# Patient Record
Sex: Male | Born: 1970 | Race: White | Hispanic: No | Marital: Single | Smoking: Never smoker
Health system: Southern US, Community
[De-identification: ages and names within clinical notes are randomized; demographics above are authoritative.]

## PROBLEM LIST (undated history)

## (undated) ENCOUNTER — Ambulatory Visit: Admission: EM | Payer: BC Managed Care – PPO

## (undated) DIAGNOSIS — E079 Disorder of thyroid, unspecified: Secondary | ICD-10-CM

## (undated) DIAGNOSIS — I10 Essential (primary) hypertension: Secondary | ICD-10-CM

## (undated) DIAGNOSIS — T7840XA Allergy, unspecified, initial encounter: Secondary | ICD-10-CM

## (undated) DIAGNOSIS — K219 Gastro-esophageal reflux disease without esophagitis: Secondary | ICD-10-CM

## (undated) DIAGNOSIS — I4891 Unspecified atrial fibrillation: Secondary | ICD-10-CM

## (undated) HISTORY — DX: Allergy, unspecified, initial encounter: T78.40XA

## (undated) HISTORY — PX: CHOLECYSTECTOMY: SHX55

## (undated) HISTORY — PX: GALLBLADDER SURGERY: SHX652

## (undated) HISTORY — DX: Gastro-esophageal reflux disease without esophagitis: K21.9

---

## 2020-09-17 DIAGNOSIS — Z86711 Personal history of pulmonary embolism: Secondary | ICD-10-CM | POA: Insufficient documentation

## 2020-09-17 DIAGNOSIS — D649 Anemia, unspecified: Secondary | ICD-10-CM | POA: Insufficient documentation

## 2020-09-17 DIAGNOSIS — I5030 Unspecified diastolic (congestive) heart failure: Secondary | ICD-10-CM | POA: Insufficient documentation

## 2020-09-17 DIAGNOSIS — D721 Eosinophilia, unspecified: Secondary | ICD-10-CM | POA: Insufficient documentation

## 2020-09-17 DIAGNOSIS — E039 Hypothyroidism, unspecified: Secondary | ICD-10-CM | POA: Insufficient documentation

## 2020-09-17 DIAGNOSIS — I4891 Unspecified atrial fibrillation: Secondary | ICD-10-CM | POA: Insufficient documentation

## 2020-09-17 DIAGNOSIS — Z7901 Long term (current) use of anticoagulants: Secondary | ICD-10-CM | POA: Insufficient documentation

## 2020-09-17 DIAGNOSIS — F319 Bipolar disorder, unspecified: Secondary | ICD-10-CM | POA: Insufficient documentation

## 2020-09-17 DIAGNOSIS — I429 Cardiomyopathy, unspecified: Secondary | ICD-10-CM | POA: Insufficient documentation

## 2020-09-17 DIAGNOSIS — E871 Hypo-osmolality and hyponatremia: Secondary | ICD-10-CM | POA: Insufficient documentation

## 2020-12-27 DIAGNOSIS — K760 Fatty (change of) liver, not elsewhere classified: Secondary | ICD-10-CM | POA: Insufficient documentation

## 2021-01-09 DIAGNOSIS — I959 Hypotension, unspecified: Secondary | ICD-10-CM | POA: Insufficient documentation

## 2021-01-09 DIAGNOSIS — I2693 Single subsegmental pulmonary embolism without acute cor pulmonale: Secondary | ICD-10-CM | POA: Insufficient documentation

## 2021-01-09 DIAGNOSIS — M7989 Other specified soft tissue disorders: Secondary | ICD-10-CM | POA: Insufficient documentation

## 2021-01-20 DIAGNOSIS — R001 Bradycardia, unspecified: Secondary | ICD-10-CM | POA: Insufficient documentation

## 2021-02-03 DIAGNOSIS — I10 Essential (primary) hypertension: Secondary | ICD-10-CM | POA: Insufficient documentation

## 2021-02-03 DIAGNOSIS — I5022 Chronic systolic (congestive) heart failure: Secondary | ICD-10-CM | POA: Insufficient documentation

## 2021-02-14 DIAGNOSIS — Z79899 Other long term (current) drug therapy: Secondary | ICD-10-CM | POA: Insufficient documentation

## 2021-04-25 DIAGNOSIS — I4811 Longstanding persistent atrial fibrillation: Secondary | ICD-10-CM | POA: Insufficient documentation

## 2021-05-04 DIAGNOSIS — R600 Localized edema: Secondary | ICD-10-CM | POA: Insufficient documentation

## 2021-05-04 DIAGNOSIS — E278 Other specified disorders of adrenal gland: Secondary | ICD-10-CM | POA: Insufficient documentation

## 2021-05-04 DIAGNOSIS — L603 Nail dystrophy: Secondary | ICD-10-CM | POA: Insufficient documentation

## 2021-07-26 DIAGNOSIS — Z23 Encounter for immunization: Secondary | ICD-10-CM | POA: Diagnosis not present

## 2021-07-26 DIAGNOSIS — Z1211 Encounter for screening for malignant neoplasm of colon: Secondary | ICD-10-CM | POA: Diagnosis not present

## 2021-07-26 DIAGNOSIS — F319 Bipolar disorder, unspecified: Secondary | ICD-10-CM | POA: Diagnosis not present

## 2021-07-26 DIAGNOSIS — Z7901 Long term (current) use of anticoagulants: Secondary | ICD-10-CM | POA: Diagnosis not present

## 2021-07-26 DIAGNOSIS — I4891 Unspecified atrial fibrillation: Secondary | ICD-10-CM | POA: Diagnosis not present

## 2021-07-26 DIAGNOSIS — I10 Essential (primary) hypertension: Secondary | ICD-10-CM | POA: Diagnosis not present

## 2021-07-26 DIAGNOSIS — Z79899 Other long term (current) drug therapy: Secondary | ICD-10-CM | POA: Diagnosis not present

## 2021-07-26 DIAGNOSIS — I4819 Other persistent atrial fibrillation: Secondary | ICD-10-CM | POA: Diagnosis not present

## 2021-07-26 DIAGNOSIS — I872 Venous insufficiency (chronic) (peripheral): Secondary | ICD-10-CM | POA: Diagnosis not present

## 2021-07-30 DIAGNOSIS — Z86711 Personal history of pulmonary embolism: Secondary | ICD-10-CM | POA: Diagnosis not present

## 2021-07-30 DIAGNOSIS — I4891 Unspecified atrial fibrillation: Secondary | ICD-10-CM | POA: Diagnosis not present

## 2021-07-30 DIAGNOSIS — Z7901 Long term (current) use of anticoagulants: Secondary | ICD-10-CM | POA: Diagnosis not present

## 2021-07-30 DIAGNOSIS — R45850 Homicidal ideations: Secondary | ICD-10-CM | POA: Diagnosis not present

## 2021-07-30 DIAGNOSIS — Z20822 Contact with and (suspected) exposure to covid-19: Secondary | ICD-10-CM | POA: Diagnosis not present

## 2021-07-30 DIAGNOSIS — F32A Depression, unspecified: Secondary | ICD-10-CM | POA: Diagnosis not present

## 2021-07-30 DIAGNOSIS — Z79899 Other long term (current) drug therapy: Secondary | ICD-10-CM | POA: Diagnosis not present

## 2021-07-30 DIAGNOSIS — F319 Bipolar disorder, unspecified: Secondary | ICD-10-CM | POA: Diagnosis not present

## 2021-07-30 DIAGNOSIS — Z9114 Patient's other noncompliance with medication regimen: Secondary | ICD-10-CM | POA: Diagnosis not present

## 2021-07-30 DIAGNOSIS — Z8249 Family history of ischemic heart disease and other diseases of the circulatory system: Secondary | ICD-10-CM | POA: Diagnosis not present

## 2021-07-31 DIAGNOSIS — F319 Bipolar disorder, unspecified: Secondary | ICD-10-CM | POA: Diagnosis not present

## 2021-08-01 DIAGNOSIS — Z7901 Long term (current) use of anticoagulants: Secondary | ICD-10-CM | POA: Diagnosis not present

## 2021-08-01 DIAGNOSIS — Z981 Arthrodesis status: Secondary | ICD-10-CM | POA: Diagnosis not present

## 2021-08-01 DIAGNOSIS — Z008 Encounter for other general examination: Secondary | ICD-10-CM | POA: Diagnosis not present

## 2021-08-01 DIAGNOSIS — I509 Heart failure, unspecified: Secondary | ICD-10-CM | POA: Diagnosis not present

## 2021-08-01 DIAGNOSIS — F5104 Psychophysiologic insomnia: Secondary | ICD-10-CM | POA: Diagnosis not present

## 2021-08-01 DIAGNOSIS — Z20822 Contact with and (suspected) exposure to covid-19: Secondary | ICD-10-CM | POA: Diagnosis not present

## 2021-08-01 DIAGNOSIS — E039 Hypothyroidism, unspecified: Secondary | ICD-10-CM | POA: Diagnosis not present

## 2021-08-01 DIAGNOSIS — L03116 Cellulitis of left lower limb: Secondary | ICD-10-CM | POA: Diagnosis not present

## 2021-08-01 DIAGNOSIS — I4819 Other persistent atrial fibrillation: Secondary | ICD-10-CM | POA: Diagnosis not present

## 2021-08-01 DIAGNOSIS — Z9114 Patient's other noncompliance with medication regimen: Secondary | ICD-10-CM | POA: Diagnosis not present

## 2021-08-01 DIAGNOSIS — Z86711 Personal history of pulmonary embolism: Secondary | ICD-10-CM | POA: Diagnosis not present

## 2021-08-01 DIAGNOSIS — Z8249 Family history of ischemic heart disease and other diseases of the circulatory system: Secondary | ICD-10-CM | POA: Diagnosis not present

## 2021-08-01 DIAGNOSIS — R45850 Homicidal ideations: Secondary | ICD-10-CM | POA: Diagnosis not present

## 2021-08-01 DIAGNOSIS — K5900 Constipation, unspecified: Secondary | ICD-10-CM | POA: Diagnosis not present

## 2021-08-01 DIAGNOSIS — R600 Localized edema: Secondary | ICD-10-CM | POA: Diagnosis not present

## 2021-08-01 DIAGNOSIS — I110 Hypertensive heart disease with heart failure: Secondary | ICD-10-CM | POA: Diagnosis not present

## 2021-08-01 DIAGNOSIS — Z79890 Hormone replacement therapy (postmenopausal): Secondary | ICD-10-CM | POA: Diagnosis not present

## 2021-08-01 DIAGNOSIS — F3130 Bipolar disorder, current episode depressed, mild or moderate severity, unspecified: Secondary | ICD-10-CM | POA: Diagnosis not present

## 2021-08-01 DIAGNOSIS — Z91199 Patient's noncompliance with other medical treatment and regimen due to unspecified reason: Secondary | ICD-10-CM | POA: Diagnosis not present

## 2021-08-01 DIAGNOSIS — Z79899 Other long term (current) drug therapy: Secondary | ICD-10-CM | POA: Diagnosis not present

## 2021-08-01 DIAGNOSIS — Z634 Disappearance and death of family member: Secondary | ICD-10-CM | POA: Diagnosis not present

## 2021-08-01 DIAGNOSIS — I4891 Unspecified atrial fibrillation: Secondary | ICD-10-CM | POA: Diagnosis not present

## 2021-08-01 DIAGNOSIS — Z818 Family history of other mental and behavioral disorders: Secondary | ICD-10-CM | POA: Diagnosis not present

## 2021-08-01 DIAGNOSIS — F319 Bipolar disorder, unspecified: Secondary | ICD-10-CM | POA: Diagnosis not present

## 2021-08-11 DIAGNOSIS — L03116 Cellulitis of left lower limb: Secondary | ICD-10-CM | POA: Insufficient documentation

## 2021-08-24 DIAGNOSIS — I951 Orthostatic hypotension: Secondary | ICD-10-CM | POA: Diagnosis not present

## 2021-08-24 DIAGNOSIS — I4811 Longstanding persistent atrial fibrillation: Secondary | ICD-10-CM | POA: Diagnosis not present

## 2021-09-20 ENCOUNTER — Ambulatory Visit: Payer: BC Managed Care – PPO | Admitting: Nurse Practitioner

## 2021-09-20 ENCOUNTER — Encounter: Payer: Self-pay | Admitting: Nurse Practitioner

## 2021-09-20 VITALS — BP 92/60 | HR 96 | Temp 97.4°F | Resp 18 | Ht 77.0 in | Wt 235.4 lb

## 2021-09-20 DIAGNOSIS — Z86711 Personal history of pulmonary embolism: Secondary | ICD-10-CM | POA: Diagnosis not present

## 2021-09-20 DIAGNOSIS — E039 Hypothyroidism, unspecified: Secondary | ICD-10-CM

## 2021-09-20 DIAGNOSIS — I952 Hypotension due to drugs: Secondary | ICD-10-CM

## 2021-09-20 DIAGNOSIS — L97521 Non-pressure chronic ulcer of other part of left foot limited to breakdown of skin: Secondary | ICD-10-CM

## 2021-09-20 DIAGNOSIS — I5030 Unspecified diastolic (congestive) heart failure: Secondary | ICD-10-CM

## 2021-09-20 DIAGNOSIS — I10 Essential (primary) hypertension: Secondary | ICD-10-CM | POA: Diagnosis not present

## 2021-09-20 DIAGNOSIS — Z7689 Persons encountering health services in other specified circumstances: Secondary | ICD-10-CM

## 2021-09-20 DIAGNOSIS — I4891 Unspecified atrial fibrillation: Secondary | ICD-10-CM | POA: Diagnosis not present

## 2021-09-20 MED ORDER — AMOXICILLIN-POT CLAVULANATE 875-125 MG PO TABS: 1.0000 | ORAL_TABLET | Freq: Two times a day (BID) | ORAL | 0 refills | Status: DC

## 2021-09-20 MED ORDER — EUTHYROX 75 MCG PO TABS: 75.0000 ug | ORAL_TABLET | Freq: Every day | ORAL | 1 refills | Status: DC

## 2021-09-20 NOTE — Progress Notes (Addendum)
BP 92/60    Pulse 96    Temp (!) 97.4 F (36.3 C) (Oral)    Resp 18    Ht 6\' 5"  (1.956 m)    Wt 235 lb 6.4 oz (106.8 kg)    SpO2 98%    BMI 27.91 kg/m    Subjective:    Patient ID: Curtis Hodges, male    DOB: [birth year], 51 y.o.   MRN: [id number]  HPI: Curtis Hodges is a 51 y.o. male, here with sister  Chief Complaint  Patient presents with   Establish Care    Pt is currently wanting to know if could get some antibiotic for his toe.   Establish Care:  Unsure when last physical he thinks it has been a year or two.  He will schedule for physical.   Afib/heart failure/ Multiple PEs:  He has had Afib for awhile and has been taking metoprolol 25 mg daily and digoxin. He said this past spring he had a multiple PEs and was placed on xarelto.  He says that the also did an echo and said he has heart failure. Currently on xarelto 20 mg daily.  He sees 04-08-1991) Dr. [facility], next appointment in November 23, 2021.  He has no complaints now. He denies any chest pain, shortness of breath or pedal edema.    HTN/hypotension: He is taking metoprolol to control his Afib.  He says this causes his low blood pressure.  He also had a lot of anxiety in the summer which caused his blood pressure to be high so he was placed on clonidine.  He has since stopped the clonidine.  Hypothyroidism: He says that his hypothyroid has been well controlled with his medication euthyrox 75 mcg daily.  He denies any weight loss or gain, changes in skin or hair loss. He also denies any palpitations.  Last TSH was 4.145 on 05/04/21.  Anxiety/ Bipolar:  Recently hospitalized for his anxiety, he has been out of work. 05/06/21 is his psychiatrist in [place]. He wants to continue to see him for his psychiatric care. He says he is doing much better.  He denies any suicidal thoughts.   Ulcer on left foot:  Was seen in April 2022 by orthopedics in May 2022 for similar problem.  He says a biopsy was done and it  was negative for osteomyelitis. The wound healed and he was doing okay.  His sister says that he wore bad shoes before Christmas and it started another ulcer. Ulcer is located just below the left great toe on the pad of the foot. Will start on antibiotics and send referral to podiatry.  His left great toe is larger than his right great toe. No sign of necrosis. He does have palpable pulses and good cap refill. His sister says that after the biopsy in April of 2022 he was supposed to see a vein specialist, but that appointment was cancelled due to his psychiatric admission.  New referral placed for vascular.   Relevant past medical, surgical, family and social history reviewed and updated as indicated. Interim medical history since our last visit reviewed. Allergies and medications reviewed and updated.  Review of Systems  Constitutional: Negative for fever or weight change.  Respiratory: Negative for cough and shortness of breath.   Cardiovascular: Negative for chest pain or palpitations.  Gastrointestinal: Negative for abdominal pain, no bowel changes.  Musculoskeletal: Negative for gait problem or joint swelling.  Skin: Negative for rash. Positive for wound  on bottom of left foot Neurological: Negative for dizziness or headache.  No other specific complaints in a complete review of systems (except as listed in HPI above).      Objective:    BP 92/60    Pulse 96    Temp (!) 97.4 F (36.3 C) (Oral)    Resp 18    Ht 6\' 5"  (1.956 m)    Wt 235 lb 6.4 oz (106.8 kg)    SpO2 98%    BMI 27.91 kg/m   Wt Readings from Last 3 Encounters:  09/20/21 235 lb 6.4 oz (106.8 kg)    Physical Exam  Constitutional: Patient appears well-developed and well-nourished. Overweight No distress.  HEENT: head atraumatic, normocephalic, pupils equal and reactive to light, neck supple Cardiovascular: Normal rate, irregular rhythm and normal heart sounds.  No murmur heard. No BLE edema. Pulmonary/Chest: Effort normal  and breath sounds normal. No respiratory distress. Abdominal: Soft.  There is no tenderness. Left foot: good PMS, ulcer noted on pad below left great toe, no sign of necrosis Psychiatric: Patient has a normal mood and affect. behavior is normal. Judgment and thought content normal.     Assessment & Plan:   1. Atrial fibrillation, unspecified type (HCC) -continue current treatment  2. Personal history of pulmonary embolism -continue current treatment  3. Diastolic heart failure, unspecified HF chronicity (HCC) -continue current treatment  4. Primary hypertension -continue to monitor  5. Hypotension due to drugs -continue to monitor  6. Hypothyroidism, unspecified type  - EUTHYROX 75 MCG tablet; Take 1 tablet (75 mcg total) by mouth daily.  Dispense: 90 tablet; Refill: 1  7. Ulcer of left foot, limited to breakdown of skin (HCC)  - amoxicillin-clavulanate (AUGMENTIN) 875-125 MG tablet; Take 1 tablet by mouth 2 (two) times daily.  Dispense: 20 tablet; Refill: 0 - Ambulatory referral to Podiatry - Ambulatory referral to Vascular Surgery  8. Encounter to establish care -make appointment for cpe    Follow up plan: Return in about 2 weeks (around 10/04/2021) for follow up.

## 2021-09-20 NOTE — Addendum Note (Signed)
Addended by: Della Goo F on: 09/20/2021 12:00 PM   Modules accepted: Orders

## 2021-09-28 ENCOUNTER — Ambulatory Visit (INDEPENDENT_AMBULATORY_CARE_PROVIDER_SITE_OTHER): Payer: BC Managed Care – PPO

## 2021-09-28 ENCOUNTER — Ambulatory Visit (INDEPENDENT_AMBULATORY_CARE_PROVIDER_SITE_OTHER): Payer: BC Managed Care – PPO | Admitting: Podiatry

## 2021-09-28 ENCOUNTER — Other Ambulatory Visit: Payer: Self-pay

## 2021-09-28 VITALS — BP 96/69 | HR 79 | Temp 97.2°F | Resp 16

## 2021-09-28 DIAGNOSIS — M19079 Primary osteoarthritis, unspecified ankle and foot: Secondary | ICD-10-CM | POA: Diagnosis not present

## 2021-09-28 DIAGNOSIS — R609 Edema, unspecified: Secondary | ICD-10-CM | POA: Diagnosis not present

## 2021-09-28 NOTE — Progress Notes (Signed)
Subjective:  Patient ID: Curtis Hodges, male    DOB: 10-24-1970,  MRN: [id number]  Chief Complaint  Patient presents with   Toe Pain    "I'm having swelling on the big toe of my feet."    51 y.o. male presents with the above complaint.  Patient presents with complaint of bilateral swelling to the dorsal midfoot.  Patient states that it swells up especially with being on his foot.  He states it started happening out of nowhere.  He wanted get it evaluated does not hurt him or cause any pain.  He is able to ambulate without any pain.  He denies seeing anyone else prior to seeing me.  He does not wear compression socks or does not elevate.  Would like to discuss treatment options.   Review of Systems: Negative except as noted in the HPI. Denies N/V/F/Ch.  No past medical history on file.  Current Outpatient Medications:    amoxicillin-clavulanate (AUGMENTIN) 875-125 MG tablet, Take 1 tablet by mouth 2 (two) times daily., Disp: 20 tablet, Rfl: 0   busPIRone (BUSPAR) 15 MG tablet, Take 30 mg by mouth 2 (two) times daily., Disp: , Rfl:    digoxin (LANOXIN) 0.25 MG tablet, Take 250 mcg by mouth daily., Disp: , Rfl:    EUTHYROX 75 MCG tablet, Take 1 tablet (75 mcg total) by mouth daily., Disp: 90 tablet, Rfl: 1   Melatonin 12 MG TABS, Take by mouth., Disp: , Rfl:    metoprolol succinate (TOPROL-XL) 25 MG 24 hr tablet, Take 25 mg by mouth at bedtime., Disp: , Rfl:    midodrine (PROAMATINE) 5 MG tablet, Take 5 mg by mouth 3 (three) times daily., Disp: , Rfl:    mirtazapine (REMERON) 30 MG tablet, Take 30 mg by mouth at bedtime., Disp: , Rfl:    oxcarbazepine (TRILEPTAL) 600 MG tablet, Take 600 mg by mouth 2 (two) times daily., Disp: , Rfl:    risperiDONE (RISPERDAL) 3 MG tablet, Take 6 mg by mouth at bedtime. 1.5 mg, Disp: , Rfl:    XARELTO 20 MG TABS tablet, Take 20 mg by mouth every morning., Disp: , Rfl:   Social History   Tobacco Use  Smoking Status Never  Smokeless Tobacco Never     Allergies  Allergen Reactions   Cat Hair Extract    Objective:   Vitals:   09/28/21 1129  BP: 96/69  Pulse: 79  Resp: 16  Temp: (!) 97.2 F (36.2 C)   There is no height or weight on file to calculate BMI. Constitutional Well developed. Well nourished.  Vascular Dorsalis pedis pulses palpable bilaterally. Posterior tibial pulses palpable bilaterally. Capillary refill normal to all digits.  No cyanosis or clubbing noted. Pedal hair growth normal.  Neurologic Normal speech. Oriented to person, place, and time. Epicritic sensation to light touch grossly present bilaterally.  Dermatologic Nails well groomed and normal in appearance. No open wounds. No skin lesions.  Orthopedic: Bilateral generalized swelling noted to the dorsum of the foot.  Underlying osteoarthritic changes noted.  No redness noted.  No clinical signs of infection noted no fluctuance noted.   Radiographs: 3 views of skeletally mature adult bilateral foot: Osteoarthritic changes noted to bilateral's first metatarsophalangeal joint as well as midfoot.  Patient has pes planovalgus foot structure. Assessment:   1. Arthritis of midfoot    Plan:  Patient was evaluated and treated and all questions answered.  Bilateral dorsal midfoot swelling likely due to underlying osteoarthritis -All questions and concerns  were discussed with the patient in extensive detail. -I discussed compression socks and elevation in extensive detail. -I discussed over-the-counter Voltaren gel to help with the arthritis. -At this time clinically does not have any pain therefore I will hold off on a steroid injection.  Patient agrees to the plan. -Shoe gear modification was discussed  No follow-ups on file.

## 2021-11-23 DIAGNOSIS — Z13220 Encounter for screening for lipoid disorders: Secondary | ICD-10-CM | POA: Diagnosis not present

## 2021-11-23 DIAGNOSIS — I951 Orthostatic hypotension: Secondary | ICD-10-CM | POA: Diagnosis not present

## 2021-11-23 DIAGNOSIS — I4811 Longstanding persistent atrial fibrillation: Secondary | ICD-10-CM | POA: Diagnosis not present

## 2021-12-02 DIAGNOSIS — I4811 Longstanding persistent atrial fibrillation: Secondary | ICD-10-CM | POA: Diagnosis not present

## 2021-12-02 DIAGNOSIS — I951 Orthostatic hypotension: Secondary | ICD-10-CM | POA: Diagnosis not present

## 2021-12-02 DIAGNOSIS — Z13220 Encounter for screening for lipoid disorders: Secondary | ICD-10-CM | POA: Diagnosis not present

## 2022-01-24 ENCOUNTER — Telehealth: Payer: Self-pay | Admitting: Internal Medicine

## 2022-01-24 NOTE — Telephone Encounter (Signed)
[facility] no longer has EUTHYROX 75 MCG table and they need a new RX for Levothyroxine / to change the manufacturer / please advise and send refill to [place], [place] [facility] [place]  ?[address] [place] [address]  ?Phone:  [phone]  Fax:  [phone]  ?

## 2022-01-25 MED ORDER — LEVOTHYROXINE SODIUM 75 MCG PO TABS: 75.0000 ug | ORAL_TABLET | Freq: Every day | ORAL | 3 refills | Status: DC

## 2022-02-20 ENCOUNTER — Ambulatory Visit (INDEPENDENT_AMBULATORY_CARE_PROVIDER_SITE_OTHER): Payer: BC Managed Care – PPO

## 2022-02-20 ENCOUNTER — Encounter: Payer: Self-pay | Admitting: Emergency Medicine

## 2022-02-20 ENCOUNTER — Ambulatory Visit
Admission: EM | Admit: 2022-02-20 | Discharge: 2022-02-20 | Disposition: A | Discharge status: Home / Self Care | Payer: BC Managed Care – PPO

## 2022-02-20 DIAGNOSIS — L97521 Non-pressure chronic ulcer of other part of left foot limited to breakdown of skin: Secondary | ICD-10-CM

## 2022-02-20 DIAGNOSIS — J0100 Acute maxillary sinusitis, unspecified: Secondary | ICD-10-CM

## 2022-02-20 DIAGNOSIS — J209 Acute bronchitis, unspecified: Secondary | ICD-10-CM | POA: Diagnosis not present

## 2022-02-20 DIAGNOSIS — R0600 Dyspnea, unspecified: Secondary | ICD-10-CM | POA: Diagnosis not present

## 2022-02-20 DIAGNOSIS — R0602 Shortness of breath: Secondary | ICD-10-CM | POA: Diagnosis not present

## 2022-02-20 DIAGNOSIS — R062 Wheezing: Secondary | ICD-10-CM | POA: Diagnosis not present

## 2022-02-20 MED ORDER — AMOXICILLIN-POT CLAVULANATE 875-125 MG PO TABS: 1.0000 | ORAL_TABLET | Freq: Two times a day (BID) | ORAL | 0 refills | Status: AC

## 2022-02-20 MED ORDER — ALBUTEROL SULFATE (2.5 MG/3ML) 0.083% IN NEBU
2.5000 mg | INHALATION_SOLUTION | Freq: Once | RESPIRATORY_TRACT | Status: AC
  Administered 2022-02-20: 2.5 mg via RESPIRATORY_TRACT

## 2022-02-20 MED ORDER — ALBUTEROL SULFATE HFA 108 (90 BASE) MCG/ACT IN AERS
2.0000 | INHALATION_SPRAY | Freq: Once | RESPIRATORY_TRACT | Status: AC
  Administered 2022-02-20: 2 via RESPIRATORY_TRACT

## 2022-02-20 MED ORDER — BENZONATATE 100 MG PO CAPS: 200.0000 mg | ORAL_CAPSULE | Freq: Three times a day (TID) | ORAL | 0 refills | Status: DC | PRN

## 2022-02-20 NOTE — Discharge Instructions (Addendum)
Chest x-ray is negative  I am treating you for both bronchitis and sinus infection. Take all prescribed medication. If symptoms worsen or do not readily improve, return for evaluation or follow-up with primary care provider. Use your inhaler 2 puffs every 4-6 hours as needed for shortness of breath or wheezing

## 2022-02-20 NOTE — ED Triage Notes (Signed)
Pt presents with cough and runny nose x 3 days. Pt states he had SOB yesterday and tried to use his inhaler but did not know how to use it. He denies any SOB today.

## 2022-02-20 NOTE — ED Provider Notes (Signed)
[facility]    CSN: [id number] Arrival date & time: 02/20/22  1405      History   Chief Complaint Chief Complaint  Patient presents with   Cough   Nasal Congestion    HPI Curtis Hodges is a 51 y.o. male.   HPI Patient with a history of CHF, bipolar, hypertension presents today for evaluation of wheezing and shortness of breath with associated URI symptoms x3 days.  Patient reports having 1 day of shortness of breath which was yesterday with exertion.  He reports today he has noticed some mild wheezing.  He had an inhaler at home however was uncertain of how to use it.  He has not taken any medication related to his current symptoms.  He has been afebrile.  He denies any productive cough although has a mild cough.  He denies any swelling or unintentional weight gain.  He denies any chest pain.  History reviewed. No pertinent past medical history.  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   Medication management 02/14/2021   Chronic systolic heart failure (HCC) 02/03/2021   Primary hypertension 02/03/2021   Bradycardia, unspecified 01/20/2021   Hypotension 01/09/2021   Single subsegmental pulmonary embolism without acute cor pulmonale (HCC) 01/09/2021   Swelling of toe of right foot 01/09/2021   NAFL (nonalcoholic fatty liver) 12/27/2020   Bipolar disorder, unspecified (HCC) 09/17/2020   Hypothyroidism, unspecified 09/17/2020   Unspecified atrial fibrillation (HCC) 09/17/2020   Personal history of pulmonary embolism 09/17/2020   Eosinophilia, unspecified 09/17/2020   Anemia, unspecified 09/17/2020   Cardiomyopathy, unspecified (HCC) 09/17/2020   Diastolic heart failure (HCC) 09/17/2020   Hypo-osmolality and hyponatremia 09/17/2020   Long term (current) use of anticoagulants 09/17/2020    Past Surgical  History:  Procedure Laterality Date   GALLBLADDER SURGERY         Home Medications    Prior to Admission medications   Medication Sig Start Date End Date Taking? Authorizing Provider  benzonatate (TESSALON) 100 MG capsule Take 2 capsules (200 mg total) by mouth 3 (three) times daily as needed for cough. 02/20/22  Yes Bing NeighborsHarris, Mikeal Winstanley S, FNP  busPIRone (BUSPAR) 15 MG tablet Take 30 mg by mouth 2 (two) times daily. 08/19/21  Yes [provider]  digoxin (LANOXIN) 0.25 MG tablet Take 250 mcg by mouth daily. 09/05/21  Yes [provider]  levothyroxine (SYNTHROID) 75 MCG tablet Take 1 tablet (75 mcg total) by mouth daily. 01/25/22  Yes Margarita MailAndrews, Elisabeth, DO  metoprolol succinate (TOPROL-XL) 50 MG 24 hr tablet Take 50 mg by mouth at bedtime. 01/26/22  Yes [provider]  midodrine (PROAMATINE) 5 MG tablet Take 5 mg by mouth 3 (three) times daily. 08/26/21  Yes [provider]  mirtazapine (REMERON) 30 MG tablet Take 30 mg by mouth at bedtime. 08/17/21  Yes [provider]  oxcarbazepine (TRILEPTAL) 600 MG tablet Take 600 mg by mouth 2 (two) times daily. 08/17/21  Yes [provider]  risperiDONE (RISPERDAL) 3 MG tablet Take 6 mg by mouth at bedtime. 1.5 mg 04/14/21  Yes [provider]  XARELTO 20 MG TABS tablet Take 20 mg by mouth every morning. 09/05/21  Yes [provider]  amoxicillin-clavulanate (AUGMENTIN) 875-125 MG tablet Take 1 tablet by mouth 2 (two) times daily for 10 days. 02/20/22 03/02/22  Tiburcio PeaHarris,  Godfrey Pick, FNP    Family History Family History  Problem Relation Age of Onset   COPD Mother    Atrial fibrillation Mother    Brain cancer Father     Social History Social History   Tobacco Use   Smoking status: Never   Smokeless tobacco: Never  Vaping Use   Vaping Use: Never used  Substance Use Topics   Alcohol use: Not Currently   Drug use: Never     Allergies   Cat hair extract   Review of  Systems Review of Systems Pertinent negatives listed in HPI   Physical Exam Triage Vital Signs ED Triage Vitals  Enc Vitals Group     BP 02/20/22 1419 124/87     Pulse Rate 02/20/22 1419 84     Resp --      Temp 02/20/22 1419 98.3 F (36.8 C)     Temp Source 02/20/22 1419 Oral     SpO2 02/20/22 1419 95 %     Weight --      Height --      Head Circumference --      Peak Flow --      Pain Score 02/20/22 1416 0     Pain Loc --      Pain Edu? --      Excl. in GC? --    No data found.  Updated Vital Signs BP 124/87 (BP Location: Left Arm)   Pulse 84   Temp 98.3 F (36.8 C) (Oral)   SpO2 95%   Visual Acuity Right Eye Distance:   Left Eye Distance:   Bilateral Distance:    Right Eye Near:   Left Eye Near:    Bilateral Near:     Physical Exam Constitutional:      Appearance: Normal appearance. He is not ill-appearing.  HENT:     Head: Normocephalic and atraumatic.     Right Ear: Tympanic membrane, ear canal and external ear normal.     Left Ear: Tympanic membrane, ear canal and external ear normal.     Nose: Congestion and rhinorrhea present.     Mouth/Throat:     Pharynx: No oropharyngeal exudate or posterior oropharyngeal erythema.  Eyes:     Extraocular Movements: Extraocular movements intact.     Pupils: Pupils are equal, round, and reactive to light.  Cardiovascular:     Rate and Rhythm: Normal rate. Rhythm irregular.  Pulmonary:     Breath sounds: Wheezing present.  Musculoskeletal:     Cervical back: Normal range of motion.  Lymphadenopathy:     Cervical: No cervical adenopathy.  Skin:    General: Skin is warm and dry.  Neurological:     General: No focal deficit present.     Mental Status: He is alert and oriented to person, place, and time.  Psychiatric:        Mood and Affect: Mood normal.        Behavior: Behavior normal.        Thought Content: Thought content normal.        Judgment: Judgment normal.     UC Treatments / Results   Labs (all labs ordered are listed, but only abnormal results are displayed) Labs Reviewed - No data to display  EKG   Radiology DG Chest 2 View  Result Date: 02/20/2022 CLINICAL DATA:  SOB with wheezing URI symptoms. Patient has history CHF. EXAM: CHEST - 2 VIEW COMPARISON:  None Available. FINDINGS: No consolidation. No visible  pleural effusions or pneumothorax per cardiomediastinal silhouette is within normal limits. No displaced fracture. IMPRESSION: No evidence of acute cardiopulmonary disease. Electronically Signed   By: Feliberto Harts M.D.   On: 02/20/2022 14:52    Procedures Procedures (including critical care time)  Medications Ordered in UC Medications  albuterol (PROVENTIL) (2.5 MG/3ML) 0.083% nebulizer solution 2.5 mg (2.5 mg Nebulization Given 02/20/22 1449)  albuterol (VENTOLIN HFA) 108 (90 Base) MCG/ACT inhaler 2 puff (2 puffs Inhalation Given 02/20/22 1503)    Initial Impression / Assessment and Plan / UC Course  I have reviewed the triage vital signs and the nursing notes.  Pertinent labs & imaging results that were available during my care of the patient were reviewed by me and considered in my medical decision making (see chart for details).    Checks x-ray was negative for any acute changes. Treating for acute sinusitis and acute bronchitis. Patient given a nebulizer treatment as he was actively wheezing while here in clinic today.  Patient tolerated nebulizer treatment with improved work of breathing.  Patient demonstrated how to use an inhaler by RN.  Patient given strict instructions to return if any of her symptoms worsen or do not readily improve.   Final Clinical Impressions(s) / UC Diagnoses   Final diagnoses:  Dyspnea, unspecified type  Acute bronchitis, unspecified organism  Acute non-recurrent maxillary sinusitis     Discharge Instructions      Chest x-ray is negative  I am treating you for both bronchitis and sinus infection. Take all  prescribed medication. If symptoms worsen or do not readily improve, return for evaluation or follow-up with primary care provider. Use your inhaler 2 puffs every 4-6 hours as needed for shortness of breath or wheezing      ED Prescriptions     Medication Sig Dispense Auth. Provider   amoxicillin-clavulanate (AUGMENTIN) 875-125 MG tablet Take 1 tablet by mouth 2 (two) times daily for 10 days. 20 tablet Bing Neighbors, FNP   benzonatate (TESSALON) 100 MG capsule Take 2 capsules (200 mg total) by mouth 3 (three) times daily as needed for cough. 30 capsule Bing Neighbors, FNP      PDMP not reviewed this encounter.   Bing Neighbors, FNP 02/20/22 [phone]

## 2022-07-23 ENCOUNTER — Other Ambulatory Visit: Payer: Self-pay

## 2022-07-23 ENCOUNTER — Encounter: Payer: Self-pay | Admitting: Nurse Practitioner

## 2022-07-23 ENCOUNTER — Ambulatory Visit (INDEPENDENT_AMBULATORY_CARE_PROVIDER_SITE_OTHER): Payer: BC Managed Care – PPO | Admitting: Nurse Practitioner

## 2022-07-23 VITALS — BP 122/70 | HR 71 | Temp 98.1°F | Resp 18 | Ht 77.0 in | Wt 240.9 lb

## 2022-07-23 DIAGNOSIS — Z0000 Encounter for general adult medical examination without abnormal findings: Secondary | ICD-10-CM

## 2022-07-23 DIAGNOSIS — Z79899 Other long term (current) drug therapy: Secondary | ICD-10-CM

## 2022-07-23 DIAGNOSIS — R600 Localized edema: Secondary | ICD-10-CM

## 2022-07-23 DIAGNOSIS — Z125 Encounter for screening for malignant neoplasm of prostate: Secondary | ICD-10-CM

## 2022-07-23 DIAGNOSIS — E039 Hypothyroidism, unspecified: Secondary | ICD-10-CM

## 2022-07-23 DIAGNOSIS — D649 Anemia, unspecified: Secondary | ICD-10-CM

## 2022-07-23 DIAGNOSIS — Z13220 Encounter for screening for lipoid disorders: Secondary | ICD-10-CM | POA: Diagnosis not present

## 2022-07-23 DIAGNOSIS — Z114 Encounter for screening for human immunodeficiency virus [HIV]: Secondary | ICD-10-CM

## 2022-07-23 DIAGNOSIS — F3161 Bipolar disorder, current episode mixed, mild: Secondary | ICD-10-CM

## 2022-07-23 DIAGNOSIS — R2689 Other abnormalities of gait and mobility: Secondary | ICD-10-CM

## 2022-07-23 DIAGNOSIS — Z131 Encounter for screening for diabetes mellitus: Secondary | ICD-10-CM

## 2022-07-23 DIAGNOSIS — I10 Essential (primary) hypertension: Secondary | ICD-10-CM | POA: Diagnosis not present

## 2022-07-23 DIAGNOSIS — Z1159 Encounter for screening for other viral diseases: Secondary | ICD-10-CM

## 2022-07-23 DIAGNOSIS — Z1212 Encounter for screening for malignant neoplasm of rectum: Secondary | ICD-10-CM

## 2022-07-23 DIAGNOSIS — Z1211 Encounter for screening for malignant neoplasm of colon: Secondary | ICD-10-CM

## 2022-07-23 NOTE — Progress Notes (Signed)
Name: LENON KUENNEN   MRN: [id number]    DOB: [birth year]   Date:07/23/2022       Progress Note  Subjective  Chief Complaint  Chief Complaint  Patient presents with   Annual Exam    HPI  Patient presents for annual CPE  and chronic issue. Patient aware of possible charge.   Right foot swelling:  He says that he has been having right foot swelling for about a year. He says that it comes and goes.  He says that sometimes it is painful. Patient would like to see a vascular surgeon.  He says he was told it was arthritis.  He would like further testing.  He denies any injury. Saw podiatry and they told him it was likely arthritis. He says he does not know if the swelling goes down with elevation.  Discussed compression socks he is resistent to that idea.  Will place referral to vascular.    Balance problem: he says he has had trouble with stairs for a long time. Patient reports that he losses his balance going up and down stairs.  He denies any pain with going up or down stairs. Recommend physical therapy.  Patient in agreement with plan.  Referral placed.   IPSS Questionnaire (AUA-7): Over the past month.   1)  How often have you had a sensation of not emptying your bladder completely after you finish urinating?  0 - Not at all  2)  How often have you had to urinate again less than two hours after you finished urinating? 0 - Not at all  3)  How often have you found you stopped and started again several times when you urinated?  0 - Not at all  4) How difficult have you found it to postpone urination?  0 - Not at all  5) How often have you had a weak urinary stream?  0 - Not at all  6) How often have you had to push or strain to begin urination?  0 - Not at all  7) How many times did you most typically get up to urinate from the time you went to bed until the time you got up in the morning?  0 - None  Total score:  0-7 mildly symptomatic   8-19 moderately symptomatic   20-35 severely  symptomatic     Diet: he says he needs to eat more fruits and vegetables but otherwise well rounded diet Exercise: he does not do anything for exercise out side of work , recommend getting at least 150 min of physical activity a week Sleep: 8 hours Last dental exam:he says it has been a long time Last eye exam: he says it was done last week, getting new glasses  Depression: phq 9 is negative    07/23/2022    9:46 AM 09/20/2021   10:43 AM  Depression screen PHQ 2/9  Decreased Interest 0 0  Down, Depressed, Hopeless 0 0  PHQ - 2 Score 0 0  Altered sleeping 0 0  Tired, decreased energy 0 0  Change in appetite 0 0  Feeling bad or failure about yourself  0 0  Trouble concentrating 0 0  Moving slowly or fidgety/restless 0 0  Suicidal thoughts 0 0  PHQ-9 Score 0 0  Difficult doing work/chores Not difficult at all Not difficult at all    Hypertension:  BP Readings from Last 3 Encounters:  07/23/22 122/70  02/20/22 124/87  09/28/21 96/69    Obesity:  Wt Readings from Last 3 Encounters:  07/23/22 240 lb 14.4 oz (109.3 kg)  09/20/21 235 lb 6.4 oz (106.8 kg)   BMI Readings from Last 3 Encounters:  07/23/22 28.57 kg/m  09/20/21 27.91 kg/m     Lipids:  No results found for: "CHOL" No results found for: "HDL" No results found for: "LDLCALC" No results found for: "TRIG" No results found for: "CHOLHDL" No results found for: "LDLDIRECT" Glucose:  No results found for: "GLUCOSE", "GLUCAP"  Flowsheet Row Office Visit from 07/23/2022 in [facility]  AUDIT-C Score 0        Single STD testing and prevention (HIV/chl/gon/syphilis): ordered Hep C: ordered  Skin cancer: Discussed monitoring for atypical lesions Colorectal cancer: ordered cologuard Prostate cancer: ordered No results found for: "PSA"   Lung cancer:   Low Dose CT Chest recommended if Age 79-80 years, 30 pack-year currently smoking OR have quit w/in 15years. Patient does not qualify.    AAA:  The USPSTF recommends one-time screening with ultrasonography in men ages 7 to 24 years who have ever smoked ECG:  none  Vaccines:  HPV: up to at age 48 , ask insurance if age between 47-45  Shingrix: 72-64 yo and ask insurance if covered when patient above 24 yo Pneumonia:  educated and discussed with patient. Flu:  educated and discussed with patient.  Advanced Care Planning: A voluntary discussion about advance care planning including the explanation and discussion of advance directives.  Discussed health care proxy and Living will, and the patient was able to identify a health care proxy as sister.  Patient does not have a living will at present time. If patient does have living will, I have requested they bring this to the clinic to be scanned in to their chart.  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   Longstanding persistent atrial fibrillation (HCC) 04/25/2021   Medication management 02/14/2021   Chronic systolic heart failure (HCC) 02/03/2021   Primary hypertension 02/03/2021   Bradycardia, unspecified 01/20/2021   Hypotension 01/09/2021   Single subsegmental pulmonary embolism without acute cor pulmonale (HCC) 01/09/2021   Swelling of toe of right foot 01/09/2021   NAFL (nonalcoholic fatty liver) 12/27/2020   Bipolar disorder, unspecified (HCC) 09/17/2020   Hypothyroidism, unspecified 09/17/2020   Unspecified atrial fibrillation (HCC) 09/17/2020   Personal history of pulmonary embolism 09/17/2020   Eosinophilia, unspecified 09/17/2020   Anemia, unspecified 09/17/2020   Cardiomyopathy, unspecified (HCC) 09/17/2020   Diastolic heart failure (HCC) 09/17/2020   Hypo-osmolality and hyponatremia 09/17/2020   Long term (current) use of anticoagulants 09/17/2020    Past Surgical History:  Procedure Laterality  Date   GALLBLADDER SURGERY      Family History  Problem Relation Age of Onset   COPD Mother    Atrial fibrillation Mother    Brain cancer Father     Social History   Socioeconomic History   Marital status: Single    Spouse name: Not on file   Number of children: Not on file   Years of education: Not on file   Highest education level: Not on file  Occupational History   Not on file  Tobacco Use   Smoking status: Never   Smokeless tobacco: Never  Vaping Use   Vaping Use: Never used  Substance and Sexual Activity   Alcohol use: Not  Currently   Drug use: Never   Sexual activity: Not Currently  Other Topics Concern   Not on file  Social History Narrative   Not on file   Social Determinants of Health   Financial Resource Strain: Low Risk  (07/23/2022)   Overall Financial Resource Strain (CARDIA)    Difficulty of Paying Living Expenses: Not hard at all  Food Insecurity: No Food Insecurity (07/23/2022)   Hunger Vital Sign    Worried About Running Out of Food in the Last Year: Never true    Ran Out of Food in the Last Year: Never true  Transportation Needs: No Transportation Needs (07/23/2022)   PRAPARE - Administrator, Civil Service (Medical): No    Lack of Transportation (Non-Medical): No  Physical Activity: Insufficiently Active (07/23/2022)   Exercise Vital Sign    Days of Exercise per Week: 5 days    Minutes of Exercise per Session: 10 min  Stress: No Stress Concern Present (07/23/2022)   [employer] of Occupational Health - Occupational Stress Questionnaire    Feeling of Stress : Only a little  Social Connections: Socially Isolated (07/23/2022)   Social Connection and Isolation Panel [NHANES]    Frequency of Communication with Friends and Family: Once a week    Frequency of Social Gatherings with Friends and Family: Once a week    Attends Religious Services: Never    Database administrator or Organizations: No    Attends Hospital doctor: 1 to 4 times per year    Marital Status: Never married  Intimate Partner Violence: Not At Risk (07/23/2022)   Humiliation, Afraid, Rape, and Kick questionnaire    Fear of Current or Ex-Partner: No    Emotionally Abused: No    Physically Abused: No    Sexually Abused: No     Current Outpatient Medications:    benzonatate (TESSALON) 100 MG capsule, Take 2 capsules (200 mg total) by mouth 3 (three) times daily as needed for cough., Disp: 30 capsule, Rfl: 0   busPIRone (BUSPAR) 15 MG tablet, Take 30 mg by mouth 2 (two) times daily., Disp: , Rfl:    digoxin (LANOXIN) 0.25 MG tablet, Take 250 mcg by mouth daily., Disp: , Rfl:    levothyroxine (SYNTHROID) 75 MCG tablet, Take 1 tablet (75 mcg total) by mouth daily., Disp: 90 tablet, Rfl: 3   metoprolol succinate (TOPROL-XL) 50 MG 24 hr tablet, Take 50 mg by mouth at bedtime., Disp: , Rfl:    midodrine (PROAMATINE) 5 MG tablet, Take 5 mg by mouth 3 (three) times daily., Disp: , Rfl:    mirtazapine (REMERON) 30 MG tablet, Take 30 mg by mouth at bedtime., Disp: , Rfl:    oxcarbazepine (TRILEPTAL) 600 MG tablet, Take 600 mg by mouth 2 (two) times daily., Disp: , Rfl:    risperiDONE (RISPERDAL) 3 MG tablet, Take 6 mg by mouth at bedtime. 1.5 mg, Disp: , Rfl:    XARELTO 20 MG TABS tablet, Take 20 mg by mouth every morning., Disp: , Rfl:   Allergies  Allergen Reactions   Cat Hair Extract      ROS  Constitutional: Negative for fever or weight change.  Respiratory: Negative for cough and shortness of breath.   Cardiovascular: Negative for chest pain or palpitations.  Gastrointestinal: Negative for abdominal pain, no bowel changes.  Musculoskeletal: Negative for gait problem or joint swelling.  Skin: Negative for rash.  Neurological: Negative for dizziness or headache.  No other  specific complaints in a complete review of systems (except as listed in HPI above).    Objective  Vitals:   07/23/22 0944  BP: 122/70  Pulse: 71  Resp:  18  Temp: 98.1 F (36.7 C)  TempSrc: Oral  SpO2: 96%  Weight: 240 lb 14.4 oz (109.3 kg)  Height: 6\' 5"  (1.956 m)    Body mass index is 28.57 kg/m.  Physical Exam  Constitutional: Patient appears well-developed and well-nourished. No distress.  HENT: Head: Normocephalic and atraumatic. Ears: B TMs ok, no erythema or effusion; Nose: Nose normal. Mouth/Throat: Oropharynx is clear and moist. No oropharyngeal exudate.  Eyes: Conjunctivae and EOM are normal. Pupils are equal, round, and reactive to light. No scleral icterus.  Neck: Normal range of motion. Neck supple. No JVD present. No thyromegaly present.  Cardiovascular: Normal rate, regular rhythm and normal heart sounds.  No murmur heard. No BLE edema. Pulmonary/Chest: Effort normal and breath sounds normal. No respiratory distress. Abdominal: Soft. Bowel sounds are normal, no distension. There is no tenderness. no masses Musculoskeletal: Normal range of motion, no joint effusions. No gross deformities Neurological: he is alert and oriented to person, place, and time. No cranial nerve deficit. Coordination, balance, strength, speech and gait are normal.  Skin: Skin is warm and dry. No rash noted. No erythema.  Psychiatric: Patient has a normal mood and affect. behavior is normal. Judgment and thought content normal.   No results found for this or any previous visit (from the past 2160 hour(s)).   Fall Risk:    07/23/2022    9:45 AM 09/20/2021   10:42 AM  Fall Risk   Falls in the past year? 1 0  Number falls in past yr: 1 0  Injury with Fall? 0 0  Risk for fall due to : History of fall(s) No Fall Risks  Follow up Falls evaluation completed Falls prevention discussed      Functional Status Survey: Is the patient deaf or have difficulty hearing?: No Does the patient have difficulty seeing, even when wearing glasses/contacts?: No Does the patient have difficulty concentrating, remembering, or making decisions?: No Does the  patient have difficulty walking or climbing stairs?: Yes Does the patient have difficulty dressing or bathing?: No Does the patient have difficulty doing errands alone such as visiting a doctor's office or shopping?: No    Assessment & Plan  1. Annual physical exam Increase physical activity as tolerated Eat more fruits and vegetables - Lipid panel - CBC with Differential/Platelet - COMPLETE METABOLIC PANEL WITH GFR - Hemoglobin A1c - Hepatitis C antibody - HIV Antibody (routine testing w rflx) - TSH - PSA  2. Prostate cancer screening  - PSA  3. Encounter for colorectal cancer screening  - Cologuard  4. Primary hypertension  - CBC with Differential/Platelet - COMPLETE METABOLIC PANEL WITH GFR  5. Hypothyroidism, unspecified type  - TSH  6. Bipolar disorder, current episode mixed, mild ([place])   7. Medication management  - Lipid panel - CBC with Differential/Platelet - COMPLETE METABOLIC PANEL WITH GFR - Hemoglobin A1c - TSH  8. Anemia, unspecified type  - CBC with Differential/Platelet  9. Screening for HIV without presence of risk factors  - HIV Antibody (routine testing w rflx)  10. Encounter for hepatitis C screening test for low risk patient  - Hepatitis C antibody  11. Screening for cholesterol level  - Lipid panel  12. Screening for diabetes mellitus  - Hemoglobin A1c  13. Edema of right foot Recommend trying  compression socks and elevating feet when you can - Ambulatory referral to Vascular Surgery   14. Balance problem  - Ambulatory referral to Physical Therapy   -Prostate cancer screening and PSA options (with potential risks and benefits of testing vs not testing) were discussed along with recent recs/guidelines. -USPSTF grade A and B recommendations reviewed with patient; age-appropriate recommendations, preventive care, screening tests, etc discussed and encouraged; healthy living encouraged; see AVS for patient education given  to patient -Discussed importance of 150 minutes of physical activity weekly, eat two servings of fish weekly, eat one serving of tree nuts ( cashews, pistachios, pecans, almonds.Marland Kitchen.) every other day, eat 6 servings of fruit/vegetables daily and drink plenty of water and avoid sweet beverages.

## 2022-07-24 LAB — COMPLETE METABOLIC PANEL WITH GFR
AG Ratio: 1.4 (calc) (ref 1.0–2.5)
ALT: 27 U/L (ref 9–46)
AST: 19 U/L (ref 10–35)
Albumin: 4.1 g/dL (ref 3.6–5.1)
Alkaline phosphatase (APISO): 123 U/L (ref 35–144)
BUN: 20 mg/dL (ref 7–25)
CO2: 27 mmol/L (ref 20–32)
Calcium: 9.5 mg/dL (ref 8.6–10.3)
Chloride: 106 mmol/L (ref 98–110)
Creat: 0.82 mg/dL (ref 0.70–1.30)
Globulin: 3 g/dL (calc) (ref 1.9–3.7)
Glucose, Bld: 98 mg/dL (ref 65–99)
Potassium: 4.2 mmol/L (ref 3.5–5.3)
Sodium: 141 mmol/L (ref 135–146)
Total Bilirubin: 0.5 mg/dL (ref 0.2–1.2)
Total Protein: 7.1 g/dL (ref 6.1–8.1)
eGFR: 106 mL/min/{1.73_m2} (ref >=60)

## 2022-07-24 LAB — CBC WITH DIFFERENTIAL/PLATELET
Absolute Monocytes: 621 cells/uL (ref 200–950)
Basophils Absolute: 51 cells/uL (ref 0–200)
Basophils Relative: 0.8 %
Eosinophils Absolute: 403 cells/uL (ref 15–500)
Eosinophils Relative: 6.3 %
HCT: 41.2 % (ref 38.5–50.0)
Hemoglobin: 14 g/dL (ref 13.2–17.1)
Lymphs Abs: 2438 cells/uL (ref 850–3900)
MCH: 32.1 pg (ref 27.0–33.0)
MCHC: 34 g/dL (ref 32.0–36.0)
MCV: 94.5 fL (ref 80.0–100.0)
MPV: 9.5 fL (ref 7.5–12.5)
Monocytes Relative: 9.7 %
Neutro Abs: 2886 cells/uL (ref 1500–7800)
Neutrophils Relative %: 45.1 %
Platelets: 297 10*3/uL (ref 140–400)
RBC: 4.36 10*6/uL (ref 4.20–5.80)
RDW: 12.3 % (ref 11.0–15.0)
Total Lymphocyte: 38.1 %
WBC: 6.4 10*3/uL (ref 3.8–10.8)

## 2022-07-24 LAB — PSA: PSA: 0.46 ng/mL (ref <=4.00)

## 2022-07-24 LAB — LIPID PANEL
Cholesterol: 155 mg/dL (ref <200)
HDL: 53 mg/dL (ref >=40)
LDL Cholesterol (Calc): 87 mg/dL (calc)
Non-HDL Cholesterol (Calc): 102 mg/dL (calc) (ref <130)
Total CHOL/HDL Ratio: 2.9 (calc) (ref <5.0)
Triglycerides: 67 mg/dL (ref <150)

## 2022-07-24 LAB — HEMOGLOBIN A1C
Hgb A1c MFr Bld: 5.7 % of total Hgb — ABNORMAL HIGH (ref <5.7)
Mean Plasma Glucose: 117 mg/dL
eAG (mmol/L): 6.5 mmol/L

## 2022-07-24 LAB — HIV ANTIBODY (ROUTINE TESTING W REFLEX): HIV 1&2 Ab, 4th Generation: NONREACTIVE

## 2022-07-24 LAB — HEPATITIS C ANTIBODY: Hepatitis C Ab: NONREACTIVE

## 2022-07-24 LAB — TSH: TSH: 3.86 mIU/L (ref 0.40–4.50)

## 2022-07-26 ENCOUNTER — Encounter: Payer: Self-pay | Admitting: Nurse Practitioner

## 2022-07-30 ENCOUNTER — Ambulatory Visit: Payer: BC Managed Care – PPO | Attending: Nurse Practitioner | Admitting: Physical Therapy

## 2022-07-30 ENCOUNTER — Other Ambulatory Visit: Payer: Self-pay

## 2022-07-30 ENCOUNTER — Encounter: Payer: Self-pay | Admitting: Physical Therapy

## 2022-07-30 DIAGNOSIS — M25571 Pain in right ankle and joints of right foot: Secondary | ICD-10-CM | POA: Insufficient documentation

## 2022-07-30 DIAGNOSIS — R2689 Other abnormalities of gait and mobility: Secondary | ICD-10-CM | POA: Insufficient documentation

## 2022-07-30 DIAGNOSIS — R262 Difficulty in walking, not elsewhere classified: Secondary | ICD-10-CM | POA: Diagnosis not present

## 2022-07-30 NOTE — Therapy (Signed)
OUTPATIENT PHYSICAL THERAPY LOWER EXTREMITY EVALUATION   Patient Name: Curtis Hodges MRN: [id number] DOB:1970/11/20, 51 y.o., male Today's Date: 07/30/2022   PT End of Session - 07/30/22 1344     Visit Number 1    Number of Visits 20    Date for PT Re-Evaluation 10/08/22    Authorization Type [employer]    Authorization Time Period 07/30/22-10/08/2022    Authorization - Visit Number 1    Authorization - Number of Visits 20    Progress Note Due on Visit 10    PT Start Time 1100    PT Stop Time 1145    PT Time Calculation (min) 45 min    Equipment Utilized During Treatment Gait belt    Activity Tolerance Patient tolerated treatment well             History reviewed. No pertinent past medical history. Past Surgical History:  Procedure Laterality Date   GALLBLADDER SURGERY     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   Longstanding persistent atrial fibrillation (HCC) 04/25/2021   Medication management 02/14/2021   Chronic systolic heart failure (HCC) 02/03/2021   Primary hypertension 02/03/2021   Bradycardia, unspecified 01/20/2021   Hypotension 01/09/2021   Single subsegmental pulmonary embolism without acute cor pulmonale (HCC) 01/09/2021   Swelling of toe of right foot 01/09/2021   NAFL (nonalcoholic fatty liver) 12/27/2020   Bipolar disorder, unspecified (HCC) 09/17/2020   Hypothyroidism, unspecified 09/17/2020   Unspecified atrial fibrillation (HCC) 09/17/2020   Personal history of pulmonary embolism 09/17/2020   Eosinophilia, unspecified 09/17/2020   Anemia, unspecified 09/17/2020   Cardiomyopathy, unspecified (HCC) 09/17/2020   Diastolic heart failure (HCC) 09/17/2020   Hypo-osmolality and hyponatremia 09/17/2020   Long term (current) use of anticoagulants 09/17/2020    PCP: Della Goo FNP    REFERRING PROVIDER: Della Goo FNP   REFERRING DIAG: Balance problem with stairs   THERAPY DIAG:  Difficulty in walking, not elsewhere classified  Pain in right ankle and joints of right foot  Rationale for Evaluation and Treatment: Rehabilitation  ONSET DATE: 02/15/2022   SUBJECTIVE:   SUBJECTIVE STATEMENT: See pertinent history   PERTINENT HISTORY: Pt describes that he is feeling unsteady when going upstairs. He does not regularly exercise and he would like to be shown exercises to strengthen his legs and improve his balance. He describes pain in his right foot along with swelling that has worsened over the past couple of weeks. He thinks that the swelling is caused by anxiety and he is getting it examined by a vascular specialist to rule out vascular pathology.  PAIN:  Are you having pain? No  PRECAUTIONS: None  WEIGHT BEARING RESTRICTIONS: No  FALLS:  Has patient fallen in last 6 months? No  LIVING ENVIRONMENT: Lives with: lives alone Lives in: House/apartment Stairs: No Has following equipment at home: None  OCCUPATION: Works in Geophysical data processor at [employer]   PLOF: Independent  PATIENT GOALS: To get exercises to improve leg strength and to improve balance when walking upstairs   NEXT MD VISIT: Not scheduled   OBJECTIVE:   VITALS: BP 115/65 HR 91 SpO2 99  DIAGNOSTIC FINDINGS: No imaging report available but per podiatry note in January Dr. 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  Patient has pes planovalgus foot structure.     PATIENT SURVEYS:  FOTO 55/100 with target of 79   COGNITION: Overall cognitive status: Within functional limits for tasks assessed     SENSATION: WFL   MUSCLE LENGTH: Hamstrings: Right NT deg; Left NT deg Maisie Fus test: Right NT deg; Left  NT deg  POSTURE: rounded shoulders  EDEMA: Figure 8 Ankle R/L 70 cm/ 65 cm   PALPATION: No areas TTP   LOWER EXTREMITY ROM:     Active/Passive Right 07/30/2022 Left 07/30/2022  Hip flexion    Hip extension    Hip abduction    Hip adduction    Hip internal rotation    Hip external rotation    Knee flexion    Knee extension 0 0  Ankle dorsiflexion 10 10  Ankle plantarflexion 50/50 50/50  Ankle inversion 30/30 35/35  Ankle eversion 10/10 15/15   (Blank rows = not tested)     LOWER EXTREMITY MMT:  Not performed   MMT Right eval Left eval  Hip flexion    Hip extension    Hip abduction    Hip adduction    Hip internal rotation    Hip external rotation    Knee flexion    Knee extension    Ankle dorsiflexion    Ankle plantarflexion    Ankle inversion    Ankle eversion     (Blank rows = not tested)  LOWER EXTREMITY SPECIAL TESTS:  Not performed   FUNCTIONAL TESTS:  5 times sit to stand: 25 sec  30 seconds chair stand test: 6 reps  Dynamic Gait Index: 22/24 -Mild impairment with stairs and turning to stop      GAIT: Distance walked: 50 ft  Assistive device utilized: None Level of assistance: Complete Independence Comments: Left lateral trunk lean due to excessive pronation of right foot    TODAY'S TREATMENT:                                                                                                                              DATE: 07/30/22  Long sitting plantar flexion stretch on RLE 3 x 30 sec  Standing step plantar flexion stretch on RLE on 6 inch step 3 x 30 sec  -Pt reports increased pain in right foot    PATIENT EDUCATION:  Education details: form and technique for appropriate exercise and explanation of balances deficits  Person educated: Patient Education method: Programmer, multimedia, Facilities manager, Verbal cues, and Handouts Education comprehension: verbalized understanding, returned demonstration, and verbal cues required  HOME EXERCISE  PROGRAM: Long sitting calf stretch   ASSESSMENT:  CLINICAL IMPRESSION: Patient is a 51 y.o. white male who was seen today for physical therapy evaluation and treatment for imbalance when negotiating stairs and general weakness. Pt presents at an increased risk for falls with decreased LE strength and endurance. He does possess  dynamic balance that does not place him at risk for falls. Pt's unsteadiness when negotiating stairs is likely from decreased LE strength along with excessive right ankle pronation. He has decreased right ankle mobility especially with eversion and eversion and he as swollen right ankle. Pt has already seen podiatrist in January who ruled out other pathologies for swelling and believes that it is mainly due to arthritis. However, pt is seeing a vascular specialist to further rule out vascular pathology. He will benefit from skilled PT to improve right ankle mobility and LE strength and endurance to improve balance while negotiating stairs to prevent falls.    OBJECTIVE IMPAIRMENTS: Abnormal gait, decreased balance, decreased ROM, decreased strength, and pain.   ACTIVITY LIMITATIONS: squatting and stairs  PARTICIPATION LIMITATIONS: occupation  PERSONAL FACTORS: 3+ comorbidities: pes planus on right foot along with midfoot OA, a-fib, chronic systolic hear failure    are also affecting patient's functional outcome.   REHAB POTENTIAL: Good  CLINICAL DECISION MAKING: Stable/uncomplicated  EVALUATION COMPLEXITY: Low   GOALS: Goals reviewed with patient? No  SHORT TERM GOALS: Target date: 08/13/2022  Pt will be independent with HEP in order to improve strength and balance in order to decrease fall risk and improve function at home and work. Baseline: NT  Goal status: INITIAL  Pt will decrease 5TSTS by at least 3 seconds in order to demonstrate clinically significant improvement in LE strength. Baseline: 25 sec   Goal status: INITIAL   LONG TERM GOALS: Target date:  10/08/2022   Patient will have improved function and activity level as evidenced by an increase in FOTO score by 10 points or more.  Baseline: 55/100 with target of 59  Goal status: INITIAL  2.  Patient will improve hip strength by 1/3 MMT grade for improve LE function in order to more easily and safely negotiate stairs.  Baseline: NT  Goal status: INITIAL  3.  Patient will improve 5 x STS to <12 sec to demonstrate LE strength that places him at a decreased risk for falls. Baseline: 25 sec  Goal status: INITIAL  4.  Patient will perform >=10 sit to stand reps as evidence of improved LE endurance and ability to negative stairs more easily and more safely.  Baseline: 6 reps  Goal status: INITIAL  5.  Patient will negotiate stairs without use of railings and with step over step as evidence of improve stability while negotiating stairs.  Baseline: Uses bilateral railings with step over step  Goal status: INITIAL    PLAN:  PT FREQUENCY: 1-2x/week  PT DURATION: 10 weeks  PLANNED INTERVENTIONS: Therapeutic exercises, Neuromuscular re-education, Balance training, Gait training, Joint mobilization, Joint manipulation, Stair training, Orthotic/Fit training, DME instructions, Aquatic Therapy, Dry Needling, Electrical stimulation, Cryotherapy, Moist heat, Manual therapy, and Re-evaluation  PLAN FOR NEXT SESSION: Hip MMT, ROM, and muscle length testing. Progress hip strengthening exercises   Ellin Goodie PT, DPT  07/30/2022, 1:48 PM

## 2022-08-01 ENCOUNTER — Encounter: Payer: BC Managed Care – PPO | Admitting: Physical Therapy

## 2022-08-07 ENCOUNTER — Encounter: Payer: Self-pay | Admitting: Physical Therapy

## 2022-08-07 ENCOUNTER — Ambulatory Visit: Payer: BC Managed Care – PPO | Admitting: Physical Therapy

## 2022-08-07 DIAGNOSIS — R262 Difficulty in walking, not elsewhere classified: Secondary | ICD-10-CM

## 2022-08-07 DIAGNOSIS — R2689 Other abnormalities of gait and mobility: Secondary | ICD-10-CM | POA: Diagnosis not present

## 2022-08-07 DIAGNOSIS — M25571 Pain in right ankle and joints of right foot: Secondary | ICD-10-CM | POA: Diagnosis not present

## 2022-08-07 NOTE — Therapy (Signed)
OUTPATIENT PHYSICAL THERAPY TREATMENT NOTE   Patient Name: Curtis Hodges MRN: [id number] DOB:21-Aug-1971, 51 y.o., male Today's Date: 08/07/2022  PCP: Della Goo FNP  REFERRING PROVIDER: Della Goo FNP   END OF SESSION:   PT End of Session - 08/07/22 1302     Visit Number 2    Number of Visits 20    Date for PT Re-Evaluation 10/08/22    Authorization Type BCBS    Authorization Time Period 07/30/22-10/08/2022    Authorization - Visit Number 2    Authorization - Number of Visits 20    Progress Note Due on Visit 10    PT Start Time 1100    PT Stop Time 1145    PT Time Calculation (min) 45 min    Equipment Utilized During Treatment Gait belt    Activity Tolerance Patient tolerated treatment well    Behavior During Therapy WFL for tasks assessed/performed             History reviewed. No pertinent past medical history. Past Surgical History:  Procedure Laterality Date   GALLBLADDER SURGERY     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   Longstanding persistent atrial fibrillation (HCC) 04/25/2021   Medication management 02/14/2021   Chronic systolic heart failure (HCC) 02/03/2021   Primary hypertension 02/03/2021   Bradycardia, unspecified 01/20/2021   Hypotension 01/09/2021   Single subsegmental pulmonary embolism without acute cor pulmonale (HCC) 01/09/2021   Swelling of toe of right foot 01/09/2021   NAFL (nonalcoholic fatty liver) 12/27/2020   Bipolar disorder, unspecified (HCC) 09/17/2020   Hypothyroidism, unspecified 09/17/2020   Unspecified atrial fibrillation (HCC) 09/17/2020   Personal history of pulmonary embolism 09/17/2020   Eosinophilia, unspecified 09/17/2020   Anemia, unspecified 09/17/2020   Cardiomyopathy, unspecified (HCC) 09/17/2020   Diastolic heart failure (HCC) 09/17/2020    Hypo-osmolality and hyponatremia 09/17/2020   Long term (current) use of anticoagulants 09/17/2020    REFERRING DIAG: Balance problem, trouble with stairs  THERAPY DIAG:  Difficulty in walking, not elsewhere classified  Pain in right ankle and joints of right foot  Rationale for Evaluation and Treatment Rehabilitation  PERTINENT HISTORY: Pt describes that he is feeling unsteady when going upstairs. He does not regularly exercise and he would like to be shown exercises to strengthen his legs and improve his balance. He describes pain in his right foot along with swelling that has worsened over the past couple of weeks. He thinks that the swelling is caused by anxiety and he is getting it examined by a vascular specialist to rule out vascular pathology.   PRECAUTIONS: None   SUBJECTIVE:  SUBJECTIVE STATEMENT:  Pt had some difficulty with exercises because of pain in his right ankle.    PAIN:  Are you having pain? No   OBJECTIVE: (objective measures completed at initial evaluation unless otherwise dated)  VITALS: BP 115/65 HR 91 SpO2 99   DIAGNOSTIC FINDINGS: No imaging report available but per podiatry note in January Dr. 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  Patient has pes planovalgus foot structure.        PATIENT SURVEYS:  FOTO 55/100 with target of 76    COGNITION: Overall cognitive status: Within functional limits for tasks assessed                         SENSATION: WFL     MUSCLE LENGTH: Hamstrings:60 deg with restriction  Maisie Fus test: + Bilateral    POSTURE: rounded shoulders   EDEMA: Figure 8  Ankle R/L 70 cm/ 65 cm    PALPATION: No areas TTP    LOWER EXTREMITY ROM:         Active/Passive Right 07/30/2022 Left 07/30/2022  Hip flexion  120 120   Hip extension  30  30  Hip abduction  45 45   Hip adduction  30 30   Hip internal rotation      Hip external rotation      Knee flexion      Knee extension 0 0  Ankle dorsiflexion 10 10  Ankle plantarflexion 50/50 50/50  Ankle inversion 30/30 35/35  Ankle eversion 10/10 15/15   (Blank rows = not tested)        LOWER EXTREMITY MMT:  Not performed    MMT Right eval Left eval  Hip flexion  4+  4+  Hip extension  4-  4-  Hip abduction 4-  4-   Hip adduction      Hip internal rotation      Hip external rotation      Knee flexion  5 5   Knee extension  5 5   Ankle dorsiflexion  5 5   Ankle plantarflexion      Ankle inversion      Ankle eversion       (Blank rows = not tested)   LOWER EXTREMITY SPECIAL TESTS:  Not performed    FUNCTIONAL TESTS:  5 times sit to stand: 25 sec  30 seconds chair stand test: 6 reps  Dynamic Gait Index: 22/24 -Mild impairment with stairs and turning to stop          GAIT: Distance walked: 50 ft  Assistive device utilized: None Level of assistance: Complete Independence Comments: Left lateral trunk lean due to excessive pronation of right foot      TODAY'S TREATMENT:  DATE:   08/07/22:  Therex   TM at 0.9 mph for 5 min with BUE support  MMT:  Hip Flex R/L 4+/4+  Hip Add R/L 4-/4- Hip Abd R/L 4-/4-  Hip Ext R/L 4-/4-  Knee Flex R/L 5/5 Knee Ext R/L 5/5  Ankle DF R/L 5/5   Prone Quad Stretch 3 x 30 sec  Flat foot on RLE  Mini-Squat 3 x 10  -mod VC to maintain upright posture and perform weight shift posteriorly   Ely's: + bilateral  Thomas test: + bilateral  07/30/22   Long sitting plantar flexion stretch on RLE 3 x 30 sec   Standing step plantar flexion stretch on RLE on 6 inch step 3 x 30 sec  -Pt reports increased pain in right foot      PATIENT EDUCATION:  Education details: form and technique for appropriate exercise and explanation of balances deficits  Person educated: Patient Education method: Consulting civil engineer, Demonstration, Verbal cues, and Handouts Education comprehension: verbalized understanding, returned demonstration, and verbal cues required   HOME EXERCISE PROGRAM: Access Code: [email or web address] URL: [email or web address] Date: 08/07/2022 Prepared by: Bradly Chris  Exercises - Mini Squat  - 3 x weekly - 3 sets - 10 reps - Supine Dynamic Modified Karl Pock and Hip Flexor Dynamic Stretch  - 1 x daily - 3 reps - 30 sec  hold - Prone Quadriceps Stretch with Strap  - 1 x daily - 3 reps - 30 sec  hold - Gastroc Stretch with Foot at Wall  - 1 x daily - 3 reps - 30-60 sec hold   ASSESSMENT:   CLINICAL IMPRESSION:           Pt presents for f/u right ankle paina and imbalance when negotiating stairs. Majority of session spent completing MMT and ROM evaluation. Pt shows decreased hip strength and flexibility. He required mod VC and TC throughout session to sequence tasks. Pt will be screened in December for vascular issues related to foot, because of what he describes as foot feeling swollen at end of day.  He has bilateral flat feet and he will benefit from arch strengthening exercises to build medial longitudinal arch. He will continue to benefit from skilled PT to improve right ankle mobility and LE strength and endurance to improve balance while negotiating stairs to prevent falls.   OBJECTIVE IMPAIRMENTS: Abnormal gait, decreased balance, decreased ROM, decreased strength, and pain.    ACTIVITY LIMITATIONS: squatting and stairs   PARTICIPATION LIMITATIONS: occupation   PERSONAL FACTORS: 3+ comorbidities: pes planus on right foot along with midfoot OA, a-fib, chronic systolic hear failure     are also affecting patient's functional outcome.    REHAB POTENTIAL: Good   CLINICAL DECISION MAKING: Stable/uncomplicated   EVALUATION COMPLEXITY: Low     GOALS: Goals reviewed with patient? No   SHORT TERM GOALS: Target date: 08/13/2022  Pt will be independent with HEP in order to improve strength and balance in order to decrease fall risk and improve function at home and work. Baseline: Completing independently  Goal status: Ongoing    Pt will decrease 5TSTS by at least 3 seconds in order to demonstrate clinically significant improvement in LE strength. Baseline: 25 sec   Goal status: Ongoing      LONG TERM GOALS: Target date: 10/08/2022    Patient will have improved function and activity level as evidenced by an increase in FOTO score by 10 points or more.  Baseline: 55/100 with target of 59  Goal status: Ongoing  2.  Patient will improve hip strength by 1/3 MMT grade for improve LE function in order to more easily and safely negotiate stairs.  Baseline: Hip Abd R/L 4-/4-, Hip Add R/L 4-/4-, Hip Ext R/L 4-/4- Goal status: Ongoing    3.  Patient will improve 5 x STS to <12 sec to demonstrate LE strength that places him at a decreased risk for falls. Baseline: 25 sec  Goal status: Ongoing    4.  Patient will perform >=10 sit to stand reps as evidence of improved LE endurance and ability to negative stairs more easily and more safely.  Baseline: 6 reps  Goal status: Ongoing    5.  Patient will negotiate stairs without use of railings and with step over step as evidence of improve stability while negotiating stairs.  Baseline: Uses bilateral railings with step over step  Goal status: Ongoing        PLAN:   PT FREQUENCY: 1-2x/week   PT DURATION: 10 weeks   PLANNED INTERVENTIONS: Therapeutic exercises, Neuromuscular re-education, Balance training, Gait training, Joint mobilization, Joint manipulation, Stair training, Orthotic/Fit training, DME instructions,  Aquatic Therapy, Dry Needling, Electrical stimulation, Cryotherapy, Moist heat, Manual therapy, and Re-evaluation   PLAN FOR NEXT SESSION: Ankle inversion strengthening for flat feet: seated banded inversion, bent knee stengthening with ball held between ankles etc.  Progress hip strengthening exercises with functional context of stairs: step ups and side step ups.     Bradly Chris PT, DPT  08/07/2022, 1:04 PM

## 2022-08-14 ENCOUNTER — Ambulatory Visit: Payer: BC Managed Care – PPO

## 2022-08-14 ENCOUNTER — Encounter: Payer: Self-pay | Admitting: Physical Therapy

## 2022-08-14 DIAGNOSIS — R262 Difficulty in walking, not elsewhere classified: Secondary | ICD-10-CM | POA: Diagnosis not present

## 2022-08-14 DIAGNOSIS — R2689 Other abnormalities of gait and mobility: Secondary | ICD-10-CM | POA: Diagnosis not present

## 2022-08-14 DIAGNOSIS — M25571 Pain in right ankle and joints of right foot: Secondary | ICD-10-CM

## 2022-08-14 NOTE — Therapy (Signed)
OUTPATIENT PHYSICAL THERAPY TREATMENT NOTE   Patient Name: Curtis Hodges MRN: [id number] DOB:February 24, 1971, 51 y.o., male Today's Date: 08/14/2022  PCP: Della GooJulie Pender FNP  REFERRING PROVIDER: Della GooJulie Pender FNP   END OF SESSION:   PT End of Session - 08/14/22 1020     Visit Number 3    Number of Visits 20    Date for PT Re-Evaluation 10/08/22    Authorization Type [employer]    Authorization Time Period 07/30/22-10/08/2022    Authorization - Visit Number 3    Authorization - Number of Visits 20    Progress Note Due on Visit 10    PT Start Time 1018    PT Stop Time 1056    PT Time Calculation (min) 38 min    Equipment Utilized During Treatment Gait belt    Activity Tolerance Patient tolerated treatment well    Behavior During Therapy WFL for tasks assessed/performed             History reviewed. No pertinent past medical history. Past Surgical History:  Procedure Laterality Date   GALLBLADDER SURGERY     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   Longstanding persistent atrial fibrillation (HCC) 04/25/2021   Medication management 02/14/2021   Chronic systolic heart failure (HCC) 02/03/2021   Primary hypertension 02/03/2021   Bradycardia, unspecified 01/20/2021   Hypotension 01/09/2021   Single subsegmental pulmonary embolism without acute cor pulmonale (HCC) 01/09/2021   Swelling of toe of right foot 01/09/2021   NAFL (nonalcoholic fatty liver) 12/27/2020   Bipolar disorder, unspecified (HCC) 09/17/2020   Hypothyroidism, unspecified 09/17/2020   Unspecified atrial fibrillation (HCC) 09/17/2020   Personal history of pulmonary embolism 09/17/2020   Eosinophilia, unspecified 09/17/2020   Anemia, unspecified 09/17/2020   Cardiomyopathy, unspecified (HCC) 09/17/2020   Diastolic heart failure (HCC) 09/17/2020    Hypo-osmolality and hyponatremia 09/17/2020   Long term (current) use of anticoagulants 09/17/2020    REFERRING DIAG: Balance problem, trouble with stairs  THERAPY DIAG:  Difficulty in walking, not elsewhere classified  Pain in right ankle and joints of right foot  Rationale for Evaluation and Treatment Rehabilitation  PERTINENT HISTORY: Pt describes that he is feeling unsteady when going upstairs. He does not regularly exercise and he would like to be shown exercises to strengthen his legs and improve his balance. He describes pain in his right foot along with swelling that has worsened over the past couple of weeks. He thinks that the swelling is caused by anxiety and he is getting it examined by a vascular specialist to rule out vascular pathology.   PRECAUTIONS: None   SUBJECTIVE:  SUBJECTIVE STATEMENT:  Pt had some difficulty with exercises because of pain in his right ankle.    PAIN:  Are you having pain? No   OBJECTIVE: (objective measures completed at initial evaluation unless otherwise dated)  VITALS: BP 115/65 HR 91 SpO2 99   DIAGNOSTIC FINDINGS: No imaging report available but per podiatry note in January Dr. 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  Patient has pes planovalgus foot structure.        PATIENT SURVEYS:  FOTO 55/100 with target of 76    COGNITION: Overall cognitive status: Within functional limits for tasks assessed                         SENSATION: WFL     MUSCLE LENGTH: Hamstrings:60 deg with restriction  Maisie Fus test: + Bilateral    POSTURE: rounded shoulders   EDEMA: Figure 8  Ankle R/L 70 cm/ 65 cm    PALPATION: No areas TTP    LOWER EXTREMITY ROM:         Active/Passive Right 07/30/2022 Left 07/30/2022  Hip flexion  120 120   Hip extension  30  30  Hip abduction  45 45   Hip adduction  30 30   Hip internal rotation      Hip external rotation      Knee flexion      Knee extension 0 0  Ankle dorsiflexion 10 10  Ankle plantarflexion 50/50 50/50  Ankle inversion 30/30 35/35  Ankle eversion 10/10 15/15   (Blank rows = not tested)        LOWER EXTREMITY MMT:  Not performed    MMT Right eval Left eval  Hip flexion  4+  4+  Hip extension  4-  4-  Hip abduction 4-  4-   Hip adduction      Hip internal rotation      Hip external rotation      Knee flexion  5 5   Knee extension  5 5   Ankle dorsiflexion  5 5   Ankle plantarflexion      Ankle inversion      Ankle eversion       (Blank rows = not tested)   LOWER EXTREMITY SPECIAL TESTS:  Not performed    FUNCTIONAL TESTS:  5 times sit to stand: 25 sec  30 seconds chair stand test: 6 reps  Dynamic Gait Index: 22/24 -Mild impairment with stairs and turning to stop          GAIT: Distance walked: 50 ft  Assistive device utilized: None Level of assistance: Complete Independence Comments: Left lateral trunk lean due to excessive pronation of right foot      TODAY'S TREATMENT:  DATE:   08/14/22: There.ex:   Screening R foot. Pt decline abnormal color changes to foot. Notable swelling in redness on R foot compared to L foot. R great toe blood blister appreciated on medial border. Pt declines pain changes or swelling changes with limb in dependent versus elevated positions. Further pronation of foot on R compared to L foot with full arch collapse from AP and PA with calcaneal eversion on R foot in PA. Palpable posterior tib and dorsal pedis pulses in R foot.     Education on Ankle AROM for muscle pumping along with limb elevation above heart level to assist in R foot swelling   Education on protecting blood blister with bandages when wearing shoe wear. Education to monitor for signs of infection like smells, color changes, further warmth of limb/great toe, pain.   Education on possible medial arch support OTC orthotic for improved foot position and may assist in foot placement in shoe to prevent blood blister from worsening. Encouraged to remove shoes and socks when at home and not ambulating to allow healing to blood blister and prevent maceration.    Supine with feet elevated via mat table to assist in edema management:    Ankle pumps: 2x20    RLE hip abduction: 1x20    RLE SLR: 1x20   SLS on RLE for intrinsic foot strengthening: 2 finger support, 3x30 sec bouts. Supervision. Notable hip strategy and UE support ro maintain < 5 sec. On LLE able to maintain > 5 seconds.    Reviewed HEP. Pt understanding of HEP, understanding of education provided today.     PATIENT EDUCATION:  Education details: form and technique for appropriate exercise, signs/symptoms of infection, foot postioning and limb elevation to improve R foot edema.  Person educated: Patient Education method: Explanation, Demonstration, Verbal cues, and Handouts Education comprehension: verbalized understanding, returned demonstration, and verbal cues required   HOME EXERCISE PROGRAM: Access Code: [email or web address] URL: [email or web address] Date: 08/07/2022 Prepared by: Ellin Goodie  Exercises - Mini Squat  - 3 x weekly - 3 sets - 10 reps - Supine Dynamic Modified Lenis Noon and Hip Flexor Dynamic Stretch  - 1 x daily - 3 reps - 30 sec  hold - Prone Quadriceps Stretch with Strap  - 1 x daily - 3 reps - 30 sec  hold - Gastroc Stretch with Foot at Wall  - 1 x daily - 3 reps - 30-60 sec hold   ASSESSMENT:   CLINICAL IMPRESSION:           Extensive education provided  this date as pt reporting onset of R medial great toe blood blister. Focus of session on education and foot positioning and signs/symptoms of infection if blister worsens and benefits of OTC orthotics for foot positioning. Overall palpable pulses at post tib and dorsal pedis appreciated on RLE with pt declining red flag signs/symptoms of venous or arterial issues. Pt will benefit from additional skilled PT intervention to improve deficits but also to continue monitoring R foot and refer pt to additional services if indicated. Pending vascular consult on December 11th.   OBJECTIVE IMPAIRMENTS: Abnormal gait, decreased balance, decreased ROM, decreased strength, and pain.    ACTIVITY LIMITATIONS: squatting and stairs   PARTICIPATION LIMITATIONS: occupation   PERSONAL FACTORS: 3+ comorbidities: pes planus on right foot along with midfoot OA, a-fib, chronic systolic hear failure    are also affecting patient's functional outcome.    REHAB POTENTIAL: Good   CLINICAL DECISION MAKING: Stable/uncomplicated   EVALUATION  COMPLEXITY: Low     GOALS: Goals reviewed with patient? No   SHORT TERM GOALS: Target date: 08/13/2022  Pt will be independent with HEP in order to improve strength and balance in order to decrease fall risk and improve function at home and work. Baseline: Completing independently  Goal status: Ongoing    Pt will decrease 5TSTS by at least 3 seconds in order to demonstrate clinically significant improvement in LE strength. Baseline: 25 sec   Goal status: Ongoing      LONG TERM GOALS: Target date: 10/08/2022    Patient will have improved function and activity level as evidenced by an increase in FOTO score by 10 points or more.  Baseline: 55/100 with target of 59  Goal status: Ongoing    2.  Patient will improve hip strength by 1/3 MMT grade for improve LE function in order to more easily and safely negotiate stairs.  Baseline: Hip Abd R/L 4-/4-, Hip Add R/L 4-/4-, Hip Ext  R/L 4-/4- Goal status: Ongoing    3.  Patient will improve 5 x STS to <12 sec to demonstrate LE strength that places him at a decreased risk for falls. Baseline: 25 sec  Goal status: Ongoing    4.  Patient will perform >=10 sit to stand reps as evidence of improved LE endurance and ability to negative stairs more easily and more safely.  Baseline: 6 reps  Goal status: Ongoing    5.  Patient will negotiate stairs without use of railings and with step over step as evidence of improve stability while negotiating stairs.  Baseline: Uses bilateral railings with step over step  Goal status: Ongoing        PLAN:   PT FREQUENCY: 1-2x/week   PT DURATION: 10 weeks   PLANNED INTERVENTIONS: Therapeutic exercises, Neuromuscular re-education, Balance training, Gait training, Joint mobilization, Joint manipulation, Stair training, Orthotic/Fit training, DME instructions, Aquatic Therapy, Dry Needling, Electrical stimulation, Cryotherapy, Moist heat, Manual therapy, and Re-evaluation   PLAN FOR NEXT SESSION: Ankle inversion strengthening for flat feet: seated banded inversion, bent knee stengthening with ball held between ankles etc.  Progress hip strengthening exercises with functional context of stairs: step ups and side step ups.    Delphia Grates. Fairly IV, PT, DPT Physical Therapist- [place]  [facility]  08/14/2022, 11:17 AM

## 2022-08-19 ENCOUNTER — Ambulatory Visit
Admission: EM | Admit: 2022-08-19 | Discharge: 2022-08-19 | Disposition: A | Discharge status: Home / Self Care | Payer: BC Managed Care – PPO | Attending: Emergency Medicine | Admitting: Emergency Medicine

## 2022-08-19 DIAGNOSIS — U071 COVID-19: Secondary | ICD-10-CM | POA: Diagnosis not present

## 2022-08-19 DIAGNOSIS — S90424A Blister (nonthermal), right lesser toe(s), initial encounter: Secondary | ICD-10-CM | POA: Insufficient documentation

## 2022-08-19 DIAGNOSIS — Z79899 Other long term (current) drug therapy: Secondary | ICD-10-CM | POA: Diagnosis not present

## 2022-08-19 DIAGNOSIS — X58XXXA Exposure to other specified factors, initial encounter: Secondary | ICD-10-CM | POA: Diagnosis not present

## 2022-08-19 DIAGNOSIS — Z792 Long term (current) use of antibiotics: Secondary | ICD-10-CM | POA: Insufficient documentation

## 2022-08-19 DIAGNOSIS — J069 Acute upper respiratory infection, unspecified: Secondary | ICD-10-CM | POA: Insufficient documentation

## 2022-08-19 HISTORY — DX: Unspecified atrial fibrillation: I48.91

## 2022-08-19 HISTORY — DX: Essential (primary) hypertension: I10

## 2022-08-19 HISTORY — DX: Disorder of thyroid, unspecified: E07.9

## 2022-08-19 LAB — RESP PANEL BY RT-PCR (FLU A&B, COVID) ARPGX2
Influenza A by PCR: NEGATIVE
Influenza B by PCR: NEGATIVE
SARS Coronavirus 2 by RT PCR: POSITIVE — AB

## 2022-08-19 LAB — POCT RAPID STREP A (OFFICE): Rapid Strep A Screen: NEGATIVE

## 2022-08-19 MED ORDER — BENZONATATE 100 MG PO CAPS: 100.0000 mg | ORAL_CAPSULE | Freq: Three times a day (TID) | ORAL | 0 refills | Status: DC | PRN

## 2022-08-19 MED ORDER — MUPIROCIN 2 % EX OINT: 1.0000 | TOPICAL_OINTMENT | Freq: Two times a day (BID) | CUTANEOUS | 0 refills | Status: DC

## 2022-08-19 NOTE — ED Triage Notes (Signed)
Patient to Urgent Care with complaints of sore throat, cough, and fever x5 days. Nasal congestion today. Possible fever on Friday (hot flashes/ chills).   Patient also reports having a blood blister on his right foot.

## 2022-08-19 NOTE — Discharge Instructions (Addendum)
Your strep test is negative.  Your COVID and Flu tests are pending.    Take the Tessalon as directed for cough.  Take Tylenol or ibuprofen as needed for fever or discomfort.  Rest and keep yourself hydrated.    Use the mupirocin ointment on your toe wound.   Follow-up with your primary care provider if your symptoms are not improving.

## 2022-08-19 NOTE — ED Provider Notes (Signed)
Curtis Hodges    CSN: [id number] Arrival date & time: 08/19/22  1226      History   Chief Complaint Chief Complaint  Patient presents with   Sore Throat   Cough   Fever    HPI Curtis Hodges is a 51 y.o. male.  Patient presents with sore throat, congestion, cough x 5 days.  He reports possible fever 2 days ago because he had chills.  Patient also presents with a blister on his right great toe times several weeks.  The blister popped.  He covered it with a bandage.  No drainage or redness.  He denies shortness of breath, vomiting, diarrhea, or other symptoms.  No OTC medications taken.  His medical history includes hypertension, atrial fibrillation, cardiomyopathy, heart failure, pulmonary embolism, fatty liver, thyroid disease, bipolar disorder.  The history is provided by the patient and medical records.    Past Medical History:  Diagnosis Date   Atrial fibrillation (HCC)    Hypertension    Thyroid disease     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   Longstanding persistent atrial fibrillation (HCC) 04/25/2021   Medication management 02/14/2021   Chronic systolic heart failure (HCC) 02/03/2021   Primary hypertension 02/03/2021   Bradycardia, unspecified 01/20/2021   Hypotension 01/09/2021   Single subsegmental pulmonary embolism without acute cor pulmonale (HCC) 01/09/2021   Swelling of toe of right foot 01/09/2021   NAFL (nonalcoholic fatty liver) 12/27/2020   Bipolar disorder, unspecified (HCC) 09/17/2020   Hypothyroidism, unspecified 09/17/2020   Unspecified atrial fibrillation (HCC) 09/17/2020   Personal history of pulmonary embolism 09/17/2020   Eosinophilia, unspecified 09/17/2020   Anemia, unspecified 09/17/2020   Cardiomyopathy, unspecified (HCC) 09/17/2020   Diastolic heart failure (HCC)  02/77/4128   Hypo-osmolality and hyponatremia 09/17/2020   Long term (current) use of anticoagulants 09/17/2020    Past Surgical History:  Procedure Laterality Date   CHOLECYSTECTOMY     GALLBLADDER SURGERY         Home Medications    Prior to Admission medications   Medication Sig Start Date End Date Taking? Authorizing Provider  benzonatate (TESSALON) 100 MG capsule Take 1 capsule (100 mg total) by mouth 3 (three) times daily as needed for cough. 08/19/22  Yes Mickie Bail, NP  mupirocin ointment (BACTROBAN) 2 % Apply 1 Application topically 2 (two) times daily. 08/19/22  Yes Mickie Bail, NP  busPIRone (BUSPAR) 15 MG tablet Take 30 mg by mouth 2 (two) times daily. 08/19/21   [provider]  digoxin (LANOXIN) 0.25 MG tablet Take 250 mcg by mouth daily. 09/05/21   [provider]  levothyroxine (SYNTHROID) 75 MCG tablet Take 1 tablet (75 mcg total) by mouth daily. 01/25/22   Margarita Mail, DO  metoprolol succinate (TOPROL-XL) 50 MG 24 hr tablet Take 50 mg by mouth at bedtime. 01/26/22   [provider]  midodrine (PROAMATINE) 5 MG tablet Take 5 mg by mouth 3 (three) times daily. 08/26/21   [provider]  mirtazapine (REMERON) 30 MG tablet Take 30 mg by mouth at bedtime. 08/17/21   [provider]  oxcarbazepine (TRILEPTAL) 600 MG tablet Take 600 mg by mouth 2 (two) times daily. 08/17/21   [provider]  risperiDONE (RISPERDAL) 3 MG tablet Take 6 mg by mouth at bedtime. 1.5 mg 04/14/21  [provider]  XARELTO 20 MG TABS tablet Take 20 mg by mouth every morning. 09/05/21   [provider]    Family History Family History  Problem Relation Age of Onset   COPD Mother    Atrial fibrillation Mother    Brain cancer Father     Social History Social History   Tobacco Use   Smoking status: Never   Smokeless tobacco: Never  Vaping Use   Vaping Use: Never used  Substance Use Topics   Alcohol use: Not  Currently   Drug use: Never     Allergies   Cat hair extract   Review of Systems Review of Systems  Constitutional:  Positive for chills and fever.  HENT:  Positive for congestion and sore throat. Negative for ear pain.   Respiratory:  Positive for cough. Negative for shortness of breath.   Cardiovascular:  Negative for chest pain and palpitations.  Gastrointestinal:  Negative for diarrhea and vomiting.  Skin:  Positive for wound. Negative for color change.  All other systems reviewed and are negative.    Physical Exam Triage Vital Signs ED Triage Vitals  Enc Vitals Group     BP      Pulse      Resp      Temp      Temp src      SpO2      Weight      Height      Head Circumference      Peak Flow      Pain Score      Pain Loc      Pain Edu?      Excl. in GC?    No data found.  Updated Vital Signs BP 115/83   Pulse 80   Temp 97.8 F (36.6 C)   Resp 18   Ht 6\' 5"  (1.956 m)   SpO2 97%   BMI 28.57 kg/m   Visual Acuity Right Eye Distance:   Left Eye Distance:   Bilateral Distance:    Right Eye Near:   Left Eye Near:    Bilateral Near:     Physical Exam Vitals and nursing note reviewed.  Constitutional:      General: He is not in acute distress.    Appearance: Normal appearance. He is well-developed. He is not ill-appearing.  HENT:     Right Ear: Tympanic membrane normal.     Left Ear: Tympanic membrane normal.     Nose: Nose normal.     Mouth/Throat:     Mouth: Mucous membranes are moist.     Pharynx: Oropharynx is clear.  Cardiovascular:     Rate and Rhythm: Normal rate and regular rhythm.     Heart sounds: Normal heart sounds.  Pulmonary:     Effort: Pulmonary effort is normal. No respiratory distress.     Breath sounds: Normal breath sounds.  Musculoskeletal:        General: No swelling, tenderness or deformity. Normal range of motion.     Cervical back: Neck supple.  Skin:    General: Skin is warm and dry.     Capillary Refill:  Capillary refill takes less than 2 seconds.     Findings: Lesion present.     Comments: Healing wound on lateral side of right great toe; no drainage or erythema.   Neurological:     Mental Status: He is alert.     Sensory: No sensory deficit.  Motor: No weakness.     Gait: Gait normal.  Psychiatric:        Mood and Affect: Mood normal.        Behavior: Behavior normal.      UC Treatments / Results  Labs (all labs ordered are listed, but only abnormal results are displayed) Labs Reviewed  RESP PANEL BY RT-PCR (FLU A&B, COVID) [id number]  POCT RAPID STREP A (OFFICE)    EKG   Radiology No results found.  Procedures Procedures (including critical care time)  Medications Ordered in UC Medications - No data to display  Initial Impression / Assessment and Plan / UC Course  I have reviewed the triage vital signs and the nursing notes.  Pertinent labs & imaging results that were available during my care of the patient were reviewed by me and considered in my medical decision making (see chart for details).    Viral URI. Blister of right great toe.  Rapid strep negative.  COVID and Flu pending.  Discussed symptomatic treatment including Tylenol or ibuprofen, rest, hydration.  The blister on his toe appears to be healing well.  Treating with mupirocin.  Instructed patient to follow up with PCP if symptoms are not improving.  He agrees to plan of care.   Final Clinical Impressions(s) / UC Diagnoses   Final diagnoses:  Viral URI  Blister of toe of right foot without infection, initial encounter     Discharge Instructions      Your strep test is negative.  Your COVID and Flu tests are pending.    Take the Tessalon as directed for cough.  Take Tylenol or ibuprofen as needed for fever or discomfort.  Rest and keep yourself hydrated.    Use the mupirocin ointment on your toe wound.   Follow-up with your primary care provider if your symptoms are not improving.          ED Prescriptions     Medication Sig Dispense Auth. Provider   mupirocin ointment (BACTROBAN) 2 % Apply 1 Application topically 2 (two) times daily. 22 g Mickie Bail, NP   benzonatate (TESSALON) 100 MG capsule Take 1 capsule (100 mg total) by mouth 3 (three) times daily as needed for cough. 21 capsule Mickie Bail, NP      PDMP not reviewed this encounter.   Mickie Bail, NP 08/19/22 1327

## 2022-08-20 ENCOUNTER — Ambulatory Visit: Payer: Self-pay | Admitting: *Deleted

## 2022-08-20 NOTE — Telephone Encounter (Signed)
Summary: covid pos/what to do!   Pt went to UC yesterday.  Today he finds out he has covid.  Pt  only sx today is stuffy head.  Would like to know what steps are next.         Chief Complaint: + COVID Symptoms: congestion Frequency: symptoms started last Tuesday Pertinent Negatives: Patient denies SOB, cough Disposition: [] ED /[] Urgent Care (no appt availability in office) / [] Appointment(In office/virtual)/ []  [facility]/ [x] Home Care/ [] Refused Recommended Disposition /[]  Mobile Bus/ []  Follow-up with PCP Additional Notes: Patient was seen at UC last week and he got his test results back today- patient reports he is feeling better- advised per protocol- treatment/isolation. Will send FTI notification to PCP- patient advised call back if feels worse in any way or feels needs to be seen.   Reason for Disposition  [1] COVID-19 diagnosed by doctor (or NP/PA) AND [2] mild symptoms (e.g., cough, fever, others) AND [3] no complications or SOB  Answer Assessment - Initial Assessment Questions 1. COVID-19 DIAGNOSIS: "How do you know that you have COVID?" (e.g., positive lab test or self-test, diagnosed by doctor or NP/PA, symptoms after exposure).     UC- lab 2. COVID-19 EXPOSURE: "Was there any known exposure to COVID before the symptoms began?" CDC Definition of close contact: within 6 feet (2 meters) for a total of 15 minutes or more over a 24-hour period.      unknown 3. ONSET: "When did the COVID-19 symptoms start?"      Sore throat/cough- Tuesday 4. WORST SYMPTOM: "What is your worst symptom?" (e.g., cough, fever, shortness of breath, muscle aches)     congestion 5. COUGH: "Do you have a cough?" If Yes, ask: "How bad is the cough?"       Little- not too bad- has cough medication 6. FEVER: "Do you have a fever?" If Yes, ask: "What is your temperature, how was it measured, and when did it start?"     No fever  7. RESPIRATORY STATUS: "Describe your breathing?" (e.g.,  normal; shortness of breath, wheezing, unable to speak)      normal 8. BETTER-SAME-WORSE: "Are you getting better, staying the same or getting worse compared to yesterday?"  If getting worse, ask, "In what way?"     better 9. OTHER SYMPTOMS: "Do you have any other symptoms?"  (e.g., chills, fatigue, headache, loss of smell or taste, muscle pain, sore throat)     no 10. HIGH RISK DISEASE: "Do you have any chronic medical problems?" (e.g., asthma, heart or lung disease, weak immune system, obesity, etc.)       Heart disease  11. VACCINE: "Have you had the COVID-19 vaccine?" If Yes, ask: "Which one, how many shots, when did you get it?"       no  Protocols used: Coronavirus (COVID-19) Diagnosed or Suspected-A-AH

## 2022-08-21 ENCOUNTER — Ambulatory Visit: Payer: BC Managed Care – PPO | Admitting: Physical Therapy

## 2022-08-22 ENCOUNTER — Telehealth (INDEPENDENT_AMBULATORY_CARE_PROVIDER_SITE_OTHER): Payer: BC Managed Care – PPO | Admitting: Physician Assistant

## 2022-08-22 ENCOUNTER — Ambulatory Visit: Payer: Self-pay

## 2022-08-22 ENCOUNTER — Ambulatory Visit: Payer: Self-pay | Admitting: *Deleted

## 2022-08-22 DIAGNOSIS — U071 COVID-19: Secondary | ICD-10-CM | POA: Diagnosis not present

## 2022-08-22 NOTE — Telephone Encounter (Signed)
  Chief Complaint: COVID Symptoms: sore throat, head congestion Frequency: yesterday Pertinent Negatives: Patient denies SOB or fever Disposition: _0 ED /_1 Urgent Care (no appt availability in office) / _2 Appointment(In office/virtual)/ _3  [facility]/ _4 Home Care/ _5 Refused Recommended Disposition /_6  Mobile Bus/ _7  Follow-up with PCP Additional Notes: pt already been triaged for COVID on 08/20/22, pt states sore throat started yesterday evening and not getting any better. Advised since sx have worsened recommend VV. Scheduled VV today at 1120 with Erin, PA. Gave care advice and pt verbalized understanding.   Summary: Pt stated he was diagnosed with Covid and has questions about med for sore throat   Pt stated he was diagnosed with Covid and requests a return call to advise what he can take for sore throat.     Reason for Disposition  [1] COVID-19 diagnosed by positive lab test (e.g., PCR, rapid self-test kit) AND [2] mild symptoms (e.g., cough, fever, others) AND [3] no complications or SOB  Answer Assessment - Initial Assessment Questions 1. COVID-19 DIAGNOSIS: "How do you know that you have COVID?" (e.g., positive lab test or self-test, diagnosed by doctor or NP/PA, symptoms after exposure).     UC 08/19/22 3. ONSET: "When did the COVID-19 symptoms start?"      08/14/22 5. COUGH: "Do you have a cough?" If Yes, ask: "How bad is the cough?"       yes 6. FEVER: "Do you have a fever?" If Yes, ask: "What is your temperature, how was it measured, and when did it start?"     no 7. RESPIRATORY STATUS: "Describe your breathing?" (e.g., normal; shortness of breath, wheezing, unable to speak)      Normal  8. BETTER-SAME-WORSE: "Are you getting better, staying the same or getting worse compared to yesterday?"  If getting worse, ask, "In what way?"     worse 9. OTHER SYMPTOMS: "Do you have any other symptoms?"  (e.g., chills, fatigue, headache, loss of smell or taste, muscle  pain, sore throat)     Sore throat and head congestion  Protocols used: Coronavirus (COVID-19) Diagnosed or Suspected-A-AH

## 2022-08-22 NOTE — Telephone Encounter (Signed)
FYI

## 2022-08-22 NOTE — Progress Notes (Signed)
Virtual Visit via Video Note  I connected with Curtis Hodges on 08/22/22 at 11:20 AM EST by a video enabled telemedicine application and verified that I am speaking with the correct person using two identifiers.  Today's Provider: Jacquelin Hawking, MHS, PA-C Introduced myself to the patient as a PA-C and provided education on APPs in clinical practice.   Location: Patient: at home [place], [place]  Provider: [facility], [place], [place]    I discussed the limitations of evaluation and management by telemedicine and the availability of in person appointments. The patient expressed understanding and agreed to proceed.  Chief Complaint  Patient presents with   Covid Positive    Sunday tested    Sore Throat   Nasal Congestion     History of Present Illness:  Reports he has a sore throat and congestion  Reports he was tested for COVID on 08/19/22 and result came back positive   Reports his symptoms started on Tuesday 08/14/22 with mild symptoms Reports he was out of work for a few days last week He went to UC on 08/19/22 and was given Tessalon pearls for cough and instructed to take OTC pain relievers for fever and pain  He is also taking Nasacort for symptoms as well.      Review of Systems  Constitutional:  Negative for chills and fever.  HENT:  Positive for congestion and sore throat. Negative for sinus pain.   Respiratory:  Positive for cough. Negative for shortness of breath and wheezing.   Cardiovascular:  Negative for chest pain.  Gastrointestinal:  Negative for diarrhea, nausea and vomiting.  Musculoskeletal:  Negative for myalgias.  Neurological:  Negative for dizziness and headaches.      Observations/Objective:  Due to the nature of the virtual visit, physical exam and observations are limited. Able to obtain the following observations:  Alert, oriented x 3 Appears comfortable, in no acute distress.  No scleral injection, no appreciated  hoarseness, tachypnea, wheeze or strider. Able to maintain conversation without visible strain.  No cough appreciated during visit.     Assessment and Plan:   Problem List Items Addressed This Visit   None Visit Diagnoses     COVID-19    -  Primary Acute, new concern Patient started to have symptoms comprised of sore throat, congestion and fatigue on Tuesday 08/14/22  He went to UC on 08/19/22 for testing and was found to be COVID positive- reviewed UC visit notes and results of testing  He is now outside therapeutic window for antivirals so I recommend symptomatic management at this time Reviewed using Mucinex, Robitussin, tessalon pearls and Flonase/Nasacort to assist with symptom management along with OTC pain relievers PRN Reviewed return and ED precautions Follow up as needed for persistent or progressing symptoms              Follow Up Instructions:    I discussed the assessment and treatment plan with the patient. The patient was provided an opportunity to ask questions and all were answered. The patient agreed with the plan and demonstrated an understanding of the instructions.   The patient was advised to call back or seek an in-person evaluation if the symptoms worsen or if the condition fails to improve as anticipated.  I provided 11 minutes of non-face-to-face time during this encounter.  No follow-ups on file.   I, Avenly Roberge E Maylani Embree, PA-C, have reviewed all documentation for this visit. The documentation on 08/22/22 for the exam, diagnosis,  procedures, and orders are all accurate and complete.   Jacquelin Hawking, MHS, PA-C [facility]

## 2022-08-22 NOTE — Patient Instructions (Addendum)
Based on your described symptoms and the duration of symptoms it is likely that you have a viral upper respiratory infection (often called a "cold")  Symptoms can last for 3-10 days with lingering cough and intermittent symptoms lasting weeks after that.  The goal of treatment at this time is to reduce your symptoms and discomfort   I have sent in Tessalon pearls for you to take twice per day to help with your cough  You can use over the counter medications such as Mucinex and Robitussin to assist with your symptoms  You can also use Flonase or Nasacort to help with your congestion   You can return to work if you feel like you are able to -just please wear a mask for 5 days while you are around others.   If your symptoms do not improve or become worse in the next 5-7 days please make an apt at the office so we can see you  Go to the ER if you begin to have more serious symptoms such as shortness of breath, trouble breathing, loss of consciousness, swelling around the eyes, high fever, severe lasting headaches, vision changes or neck pain/stiffness.

## 2022-08-22 NOTE — Telephone Encounter (Signed)
Any specific type?

## 2022-08-22 NOTE — Telephone Encounter (Signed)
Summary: rx concern   The patient was seen for a virtual visit today and has additional questions about the specific type of mucinex they should be taking for their concerns  Please contact the patient further when possible         Chief Complaint: requesting which kind of mucinex should be taking OTC ? Symptoms: covid positive cough. Last VV today  Frequency: na Pertinent Negatives: Patient denies na Disposition: [] ED /[] Urgent Care (no appt availability in office) / [] Appointment(In office/virtual)/ []  [facility]/ [] Home Care/ [] Refused Recommended Disposition /[] [place] Mobile [facility]/ [x]  Follow-up with PCP Additional Notes:   Requesting which OTC mucinex to take for sx. Last VV today . Reports "there are multiple mucinex to take". Please advise if mucinex D or liquid medication is appropriate. Patient would like a call back.     Reason for Disposition  [1] Caller has NON-URGENT medicine question about med that PCP prescribed AND [2] triager unable to answer question  Answer Assessment - Initial Assessment Questions 1. NAME of MEDICINE: "What medicine(s) are you calling about?"     mucinex 2. QUESTION: "What is your question?" (e.g., double dose of medicine, side effect)     Which type or kind of mucinex should I take ? 3. PRESCRIBER: "Who prescribed the medicine?" Reason: if prescribed by specialist, call should be referred to that group.     Na  4. SYMPTOMS: "Do you have any symptoms?" If Yes, ask: "What symptoms are you having?"  "How bad are the symptoms (e.g., mild, moderate, severe)     Yes covid positive cough  5. PREGNANCY:  "Is there any chance that you are pregnant?" "When was your last menstrual period?"     na  Protocols used: Medication Question Call-A-AH

## 2022-08-23 ENCOUNTER — Encounter: Payer: BC Managed Care – PPO | Admitting: Physical Therapy

## 2022-08-23 NOTE — Telephone Encounter (Signed)
He should take regular Mucinex. Decongestants are not recommended for him due to his hypertension so he should not use Mucinex-D formulations . Same for Robitussin types.

## 2022-08-24 NOTE — Telephone Encounter (Signed)
Called pt back no answer left vm to return call back office. Left detailed message of Curtis Hodges

## 2022-08-27 ENCOUNTER — Ambulatory Visit (INDEPENDENT_AMBULATORY_CARE_PROVIDER_SITE_OTHER): Payer: BC Managed Care – PPO | Admitting: Nurse Practitioner

## 2022-08-27 ENCOUNTER — Encounter (INDEPENDENT_AMBULATORY_CARE_PROVIDER_SITE_OTHER): Payer: Self-pay | Admitting: Nurse Practitioner

## 2022-08-27 VITALS — BP 119/74 | HR 55 | Resp 12 | Ht 77.0 in | Wt 229.0 lb

## 2022-08-27 DIAGNOSIS — R2241 Localized swelling, mass and lump, right lower limb: Secondary | ICD-10-CM

## 2022-08-27 DIAGNOSIS — I10 Essential (primary) hypertension: Secondary | ICD-10-CM | POA: Diagnosis not present

## 2022-08-27 NOTE — Progress Notes (Signed)
Subjective:    Patient ID: Curtis Hodges, male    DOB: [birth year], 51 y.o.   MRN: [id number] Chief Complaint  Patient presents with   New Patient (Initial Visit)    Consult for right foot swelling    Curtis Hodges is a 51 year old male who presents today for evaluation of right foot swelling.  He notes that his left foot swells sometimes but the right is more consistent.  He notes that this initially began around April 2022.  He noted that he had a sore on his foot and there was some swelling of his toe.  Since that time he has continued to have swelling.  The patient notes that the swelling is more so present in the foot and ankle area does not extend past that point.  He notes that sometimes swelling is worse if his blood pressure is higher.  He notes that for the last 3 weeks or so he has been wearing medical grade compression and it has helped somewhat.  He does endorse having some pain with walking but has pain more so in the ankle area versus claudication-like symptoms.    Review of Systems  Cardiovascular:  Positive for leg swelling.  Skin:  Positive for wound.  All other systems reviewed and are negative.      Objective:   Physical Exam Vitals reviewed.  HENT:     Head: Normocephalic.  Cardiovascular:     Rate and Rhythm: Normal rate.     Pulses:          Dorsalis pedis pulses are 2+ on the right side and 2+ on the left side.       Posterior tibial pulses are 1+ on the right side and 1+ on the left side.  Pulmonary:     Effort: Pulmonary effort is normal.  Musculoskeletal:     Right lower leg: Edema present.  Skin:    General: Skin is warm and dry.  Neurological:     Mental Status: He is alert and oriented to person, place, and time.  Psychiatric:        Mood and Affect: Mood normal.        Behavior: Behavior normal.        Thought Content: Thought content normal.        Judgment: Judgment normal.     BP 119/74 (BP Location: Right Arm)   Pulse (!) 55   Resp  12   Ht 6\' 5"  (1.956 m)   Wt 229 lb (103.9 kg)   BMI 27.16 kg/m   Past Medical History:  Diagnosis Date   Atrial fibrillation (HCC)    Hypertension    Thyroid disease     Social History   Socioeconomic History   Marital status: Single    Spouse name: Not on file   Number of children: Not on file   Years of education: Not on file   Highest education level: Not on file  Occupational History   Not on file  Tobacco Use   Smoking status: Never   Smokeless tobacco: Never  Vaping Use   Vaping Use: Never used  Substance and Sexual Activity   Alcohol use: Not Currently   Drug use: Never   Sexual activity: Not Currently  Other Topics Concern   Not on file  Social History Narrative   Not on file   Social Determinants of Health   Financial Resource Strain: Low Risk  (07/23/2022)   Overall Financial Resource Strain (CARDIA)  Difficulty of Paying Living Expenses: Not hard at all  Food Insecurity: No Food Insecurity (07/23/2022)   Hunger Vital Sign    Worried About Running Out of Food in the Last Year: Never true    Ran Out of Food in the Last Year: Never true  Transportation Needs: No Transportation Needs (07/23/2022)   PRAPARE - Administrator, Civil Service (Medical): No    Lack of Transportation (Non-Medical): No  Physical Activity: Insufficiently Active (07/23/2022)   Exercise Vital Sign    Days of Exercise per Week: 5 days    Minutes of Exercise per Session: 10 min  Stress: No Stress Concern Present (07/23/2022)   [employer] of Occupational Health - Occupational Stress Questionnaire    Feeling of Stress : Only a little  Social Connections: Socially Isolated (07/23/2022)   Social Connection and Isolation Panel [NHANES]    Frequency of Communication with Friends and Family: Once a week    Frequency of Social Gatherings with Friends and Family: Once a week    Attends Religious Services: Never    Database administrator or Organizations: No    Attends  Banker Meetings: 1 to 4 times per year    Marital Status: Never married  Intimate Partner Violence: Not At Risk (07/23/2022)   Humiliation, Afraid, Rape, and Kick questionnaire    Fear of Current or Ex-Partner: No    Emotionally Abused: No    Physically Abused: No    Sexually Abused: No    Past Surgical History:  Procedure Laterality Date   CHOLECYSTECTOMY     GALLBLADDER SURGERY      Family History  Problem Relation Age of Onset   COPD Mother    Atrial fibrillation Mother    Brain cancer Father     Allergies  Allergen Reactions   Cat Hair Extract        Latest Ref Rng & Units 07/23/2022   10:22 AM  CBC  WBC 3.8 - 10.8 Thousand/uL 6.4   Hemoglobin 13.2 - 17.1 g/dL 66.4   Hematocrit 40.3 - 50.0 % 41.2   Platelets 140 - 400 Thousand/uL 297       CMP     Component Value Date/Time   NA 141 07/23/2022 1022   K 4.2 07/23/2022 1022   CL 106 07/23/2022 1022   CO2 27 07/23/2022 1022   GLUCOSE 98 07/23/2022 1022   BUN 20 07/23/2022 1022   CREATININE 0.82 07/23/2022 1022   CALCIUM 9.5 07/23/2022 1022   PROT 7.1 07/23/2022 1022   AST 19 07/23/2022 1022   ALT 27 07/23/2022 1022   BILITOT 0.5 07/23/2022 1022     No results found.     Assessment & Plan:   1. Localized swelling of right foot I suspect that the cause of the swelling is more so related to arthritis and inflammation versus any venous insufficiency or lymphedema.  We will however have the patient return with venous reflux studies to ensure that there is not any underlying venous insufficiency.  We have discussed conservative therapy including use of medical grade compression, elevation and activity.  If it is found that the patient does not have any venous insufficiency, we will likely refer patient back to his podiatrist for further evaluation for possible arthritis.  2. Primary hypertension Continue antihypertensive medications as already ordered, these medications have been reviewed and  there are no changes at this time.   Current Outpatient Medications on File Prior to Visit  Medication Sig Dispense Refill   busPIRone (BUSPAR) 15 MG tablet Take 30 mg by mouth 2 (two) times daily.     digoxin (LANOXIN) 0.25 MG tablet Take 250 mcg by mouth daily.     levothyroxine (SYNTHROID) 75 MCG tablet Take 1 tablet (75 mcg total) by mouth daily. 90 tablet 3   metoprolol succinate (TOPROL-XL) 50 MG 24 hr tablet Take 50 mg by mouth at bedtime.     midodrine (PROAMATINE) 5 MG tablet Take 5 mg by mouth 3 (three) times daily.     mirtazapine (REMERON) 30 MG tablet Take 30 mg by mouth at bedtime.     mupirocin ointment (BACTROBAN) 2 % Apply 1 Application topically 2 (two) times daily. 22 g 0   risperiDONE (RISPERDAL) 3 MG tablet Take 6 mg by mouth at bedtime. 1.5 mg     XARELTO 20 MG TABS tablet Take 20 mg by mouth every morning.     benzonatate (TESSALON) 100 MG capsule Take 1 capsule (100 mg total) by mouth 3 (three) times daily as needed for cough. (Patient not taking: Reported on 08/27/2022) 21 capsule 0   oxcarbazepine (TRILEPTAL) 600 MG tablet Take 600 mg by mouth 2 (two) times daily. (Patient not taking: Reported on 08/27/2022)     No current facility-administered medications on file prior to visit.    There are no Patient Instructions on file for this visit. No follow-ups on file.   Georgiana Spinner, NP

## 2022-08-30 ENCOUNTER — Encounter: Payer: BC Managed Care – PPO | Admitting: Physical Therapy

## 2022-09-04 ENCOUNTER — Ambulatory Visit: Payer: BC Managed Care – PPO | Attending: Nurse Practitioner | Admitting: Physical Therapy

## 2022-09-04 DIAGNOSIS — R262 Difficulty in walking, not elsewhere classified: Secondary | ICD-10-CM | POA: Insufficient documentation

## 2022-09-04 DIAGNOSIS — M25571 Pain in right ankle and joints of right foot: Secondary | ICD-10-CM | POA: Diagnosis not present

## 2022-09-04 NOTE — Therapy (Signed)
OUTPATIENT PHYSICAL THERAPY TREATMENT NOTE   Patient Name: Curtis Hodges MRN: [id number] DOB:May 22, 1971, 51 y.o., male Today's Date: 09/04/2022  PCP: Serafina Royals FNP  REFERRING PROVIDER: Serafina Royals FNP   END OF SESSION:   PT End of Session - 09/04/22 1151     Visit Number 4    Number of Visits 20    Date for PT Re-Evaluation 10/08/22    Authorization Type BC[employer]    Authorization Time Period 07/30/22-10/08/2022    Authorization - Visit Number 4    Authorization - Number of Visits 20    Progress Note Due on Visit 10    PT Start Time [id number]    PT Stop Time 1230    PT Time Calculation (min) 45 min    Equipment Utilized During Treatment Gait belt    Activity Tolerance Patient tolerated treatment well    Behavior During Therapy WFL for tasks assessed/performed             Past Medical History:  Diagnosis Date   Atrial fibrillation ([place])    Hypertension    Thyroid disease    Past Surgical History:  Procedure Laterality Date   CHOLECYSTECTOMY     GALLBLADDER SURGERY     Patient Active Problem List   Diagnosis Date Noted   Cellulitis of foot, left 08/11/2021   Bilateral edema of lower extremity 05/04/2021   Adrenal nodule ([place]) 05/04/2021   Onychodystrophy 05/04/2021   Longstanding persistent atrial fibrillation ([place]) 04/25/2021   Medication management [id number]   Chronic systolic heart failure ([place]) 02/03/2021   Primary hypertension 02/03/2021   Bradycardia, unspecified 01/20/2021   Hypotension 01/09/2021   Single subsegmental pulmonary embolism without acute cor pulmonale ([place]) 01/09/2021   Swelling of toe of right foot 01/09/2021   NAFL (nonalcoholic fatty liver) 0000000   Bipolar disorder, unspecified ([place]) 09/17/2020   Hypothyroidism, unspecified 09/17/2020   Unspecified atrial fibrillation ([place]) 09/17/2020   Personal history of pulmonary embolism 09/17/2020   Eosinophilia, unspecified 09/17/2020   Anemia, unspecified 09/17/2020   Cardiomyopathy,  unspecified ([place]) 0000000   Diastolic heart failure ([place]) 09/17/2020   Hypo-osmolality and hyponatremia 09/17/2020   Long term (current) use of anticoagulants 09/17/2020    REFERRING DIAG: Balance problem, trouble with stairs  THERAPY DIAG:  Difficulty in walking, not elsewhere classified  Pain in right ankle and joints of right foot  Rationale for Evaluation and Treatment Rehabilitation  PERTINENT HISTORY: Pt describes that he is feeling unsteady when going upstairs. He does not regularly exercise and he would like to be shown exercises to strengthen his legs and improve his balance. He describes pain in his right foot along with swelling that has worsened over the past couple of weeks. He thinks that the swelling is caused by anxiety and he is getting it examined by a vascular specialist to rule out vascular pathology.   PRECAUTIONS: None   SUBJECTIVE:  SUBJECTIVE STATEMENT:  Pt reports that he had a COVID a couple of weeks ago and that he was unable to do exercises because of it. He has been feeling some pain in the right ankle but it is mostly swelling that he feels from time to time.    Are you having pain? No   OBJECTIVE: (objective measures completed at initial evaluation unless otherwise dated)  VITALS: BP 115/65 HR 91 SpO2 99   DIAGNOSTIC FINDINGS: No imaging report available but per podiatry note in January Dr. 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  Patient has pes planovalgus foot structure.        PATIENT SURVEYS:  FOTO 55/100 with target of 41    COGNITION: Overall cognitive status: Within functional  limits for tasks assessed                         SENSATION: WFL     MUSCLE LENGTH: Hamstrings:60 deg with restriction  Maisie Fus test: + Bilateral    POSTURE: rounded shoulders   EDEMA: Figure 8 Ankle R/L 70 cm/ 65 cm    PALPATION: No areas TTP    LOWER EXTREMITY ROM:         Active/Passive Right 07/30/2022 Left 07/30/2022  Hip flexion  120 120   Hip extension  30  30  Hip abduction  45 45   Hip adduction  30 30   Hip internal rotation      Hip external rotation      Knee flexion      Knee extension 0 0  Ankle dorsiflexion 10 10  Ankle plantarflexion 50/50 50/50  Ankle inversion 30/30 35/35  Ankle eversion 10/10 15/15   (Blank rows = not tested)        LOWER EXTREMITY MMT:  Not performed    MMT Right eval Left eval  Hip flexion  4+  4+  Hip extension  4-  4-  Hip abduction 4-  4-   Hip adduction      Hip internal rotation      Hip external rotation      Knee flexion  5 5   Knee extension  5 5   Ankle dorsiflexion  5 5   Ankle plantarflexion      Ankle inversion      Ankle eversion       (Blank rows = not tested)   LOWER EXTREMITY SPECIAL TESTS:  Not performed    FUNCTIONAL TESTS:  5 times sit to stand: 25 sec  30 seconds chair stand test: 6 reps  Dynamic Gait Index: 22/24 -Mild impairment with stairs and turning to stop          GAIT: Distance walked: 50 ft  Assistive device utilized: None Level of assistance: Complete Independence Comments: Left lateral trunk lean due to excessive pronation of right foot      TODAY'S TREATMENT:  DATE:   09/04/22:  THEREX:  Recumbent Bicycle 5 min at level 22  SLS on RLE 3 x 10 sec  -Pt unable to perform without use of UE support  Tandem with RLE as back foot 3 x 30 sec  Step Up on 4 inch platform with R foot as stance foot 1 x 10  Step Up on 6 inch platform with R foot as  stance foot 1 x 10  Mini-Squat on 20 inch mat height 1 x 10  -min VC to decrease speed of concentric and eccentric portion of exercise  Standing Hip Abduction with 1 UE support 2 x 10  -min VC to control eccentric portion of exercise more   08/14/22: There.ex:   Screening R foot. Pt decline abnormal color changes to foot. Notable swelling in redness on R foot compared to L foot. R great toe blood blister appreciated on medial border. Pt declines pain changes or swelling changes with limb in dependent versus elevated positions. Further pronation of foot on R compared to L foot with full arch collapse from AP and PA with calcaneal eversion on R foot in PA. Palpable posterior tib and dorsal pedis pulses in R foot.    Education on Ankle AROM for muscle pumping along with limb elevation above heart level to assist in R foot swelling   Education on protecting blood blister with bandages when wearing shoe wear. Education to monitor for signs of infection like smells, color changes, further warmth of limb/great toe, pain.   Education on possible medial arch support OTC orthotic for improved foot position and may assist in foot placement in shoe to prevent blood blister from worsening. Encouraged to remove shoes and socks when at home and not ambulating to allow healing to blood blister and prevent maceration.    Supine with feet elevated via mat table to assist in edema management:    Ankle pumps: 2x20    RLE hip abduction: 1x20    RLE SLR: 1x20   SLS on RLE for intrinsic foot strengthening: 2 finger support, 3x30 sec bouts. Supervision. Notable hip strategy and UE support ro maintain < 5 sec. On LLE able to maintain > 5 seconds.    Reviewed HEP. Pt understanding of HEP, understanding of education provided today.     PATIENT EDUCATION:  Education details: form and technique for appropriate exercise, signs/symptoms of infection, foot postioning and limb elevation to improve R foot edema.  Person  educated: Patient Education method: Explanation, Demonstration, Verbal cues, and Handouts Education comprehension: verbalized understanding, returned demonstration, and verbal cues required   HOME EXERCISE PROGRAM: Access Code: [email or web address] URL: [email or web address] Date: 09/04/2022 Prepared by: Bradly Chris  Exercises - Mini Squat  - 3 x weekly - 3 sets - 10 reps - Supine Dynamic Modified Karl Pock and Hip Flexor Dynamic Stretch  - 1 x daily - 3 reps - 30 sec  hold - Prone Quadriceps Stretch with Strap  - 1 x daily - 3 reps - 30 sec  hold - Gastroc Stretch with Foot at Wall  - 1 x daily - 3 reps - 30-60 sec hold - Tandem Stance  - 1 x daily - 5 reps - 30 sec  hold - Runner's Step Up/Down  - 3 x weekly - 3 sets - 10 reps - Standing Hip Abduction  - 3 x weekly - 3 sets - 10 reps   ASSESSMENT:   CLINICAL IMPRESSION:  Pt shows decreased eccentric control with strengthening exercises requiring min  VC to focus on slowing speed of exercise. He was able to perform all exercises without an increase right ankle pain. He will benefit from additional skilled PT intervention to improve deficits             OBJECTIVE IMPAIRMENTS: Abnormal gait, decreased balance, decreased ROM, decreased strength, and pain.    ACTIVITY LIMITATIONS: squatting and stairs   PARTICIPATION LIMITATIONS: occupation   PERSONAL FACTORS: 3+ comorbidities: pes planus on right foot along with midfoot OA, a-fib, chronic systolic hear failure    are also affecting patient's functional outcome.    REHAB POTENTIAL: Good   CLINICAL DECISION MAKING: Stable/uncomplicated   EVALUATION COMPLEXITY: Low     GOALS: Goals reviewed with patient? No   SHORT TERM GOALS: Target date: 08/13/2022  Pt will be independent with HEP in order to improve strength and balance in order to decrease fall risk and improve function at home and work. Baseline: Completing independently  Goal status: Ongoing    Pt will decrease  5TSTS by at least 3 seconds in order to demonstrate clinically significant improvement in LE strength. Baseline: 25 sec   Goal status: Ongoing      LONG TERM GOALS: Target date: 10/08/2022    Patient will have improved function and activity level as evidenced by an increase in FOTO score by 10 points or more.  Baseline: 55/100 with target of 59  Goal status: Ongoing    2.  Patient will improve hip strength by 1/3 MMT grade for improve LE function in order to more easily and safely negotiate stairs.  Baseline: Hip Abd R/L 4-/4-, Hip Add R/L 4-/4-, Hip Ext R/L 4-/4- Goal status: Ongoing    3.  Patient will improve 5 x STS to <12 sec to demonstrate LE strength that places him at a decreased risk for falls. Baseline: 25 sec  Goal status: Ongoing    4.  Patient will perform >=10 sit to stand reps as evidence of improved LE endurance and ability to negative stairs more easily and more safely.  Baseline: 6 reps  Goal status: Ongoing    5.  Patient will negotiate stairs without use of railings and with step over step as evidence of improve stability while negotiating stairs.  Baseline: Uses bilateral railings with step over step  Goal status: Ongoing        PLAN:   PT FREQUENCY: 1-2x/week   PT DURATION: 10 weeks   PLANNED INTERVENTIONS: Therapeutic exercises, Neuromuscular re-education, Balance training, Gait training, Joint mobilization, Joint manipulation, Stair training, Orthotic/Fit training, DME instructions, Aquatic Therapy, Dry Needling, Electrical stimulation, Cryotherapy, Moist heat, Manual therapy, and Re-evaluation   PLAN FOR NEXT SESSION: Reassess goals.   Ankle inversion strengthening for flat feet: seated banded inversion, bent knee stengthening with ball held between ankles etc.  Progress hip strengthening exercises with functional context of stairs: step ups and side step ups.   Bradly Chris PT, DPT   Physical Therapist- [facility]   09/04/2022, 11:54 AM

## 2022-09-06 ENCOUNTER — Encounter: Payer: BC Managed Care – PPO | Admitting: Physical Therapy

## 2022-09-11 ENCOUNTER — Encounter: Payer: Self-pay | Admitting: Physical Therapy

## 2022-09-11 ENCOUNTER — Ambulatory Visit: Payer: BC Managed Care – PPO | Admitting: Physical Therapy

## 2022-09-11 DIAGNOSIS — R262 Difficulty in walking, not elsewhere classified: Secondary | ICD-10-CM | POA: Diagnosis not present

## 2022-09-11 DIAGNOSIS — M25571 Pain in right ankle and joints of right foot: Secondary | ICD-10-CM

## 2022-09-11 NOTE — Therapy (Signed)
OUTPATIENT PHYSICAL THERAPY TREATMENT NOTE   Patient Name: Curtis Hodges MRN: [id number] DOB:06/26/1971, 51 y.o., male Today's Date: 09/11/2022  PCP: Della Goo FNP  REFERRING PROVIDER: Della Goo FNP   END OF SESSION:   PT End of Session - 09/11/22 1105     Visit Number 5    Number of Visits 20    Date for PT Re-Evaluation 10/08/22    Authorization Type BCBS    Authorization Time Period 07/30/22-10/08/2022    Authorization - Visit Number 5    Authorization - Number of Visits 20    Progress Note Due on Visit 10    PT Start Time 1105    PT Stop Time 1145    PT Time Calculation (min) 40 min    Equipment Utilized During Treatment Gait belt    Activity Tolerance Patient tolerated treatment well    Behavior During Therapy WFL for tasks assessed/performed             Past Medical History:  Diagnosis Date   Atrial fibrillation (HCC)    Hypertension    Thyroid disease    Past Surgical History:  Procedure Laterality Date   CHOLECYSTECTOMY     GALLBLADDER SURGERY     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   Longstanding persistent atrial fibrillation (HCC) 04/25/2021   Medication management 02/14/2021   Chronic systolic heart failure (HCC) 02/03/2021   Primary hypertension 02/03/2021   Bradycardia, unspecified 01/20/2021   Hypotension 01/09/2021   Single subsegmental pulmonary embolism without acute cor pulmonale (HCC) 01/09/2021   Swelling of toe of right foot 01/09/2021   NAFL (nonalcoholic fatty liver) 12/27/2020   Bipolar disorder, unspecified (HCC) 09/17/2020   Hypothyroidism, unspecified 09/17/2020   Unspecified atrial fibrillation (HCC) 09/17/2020   Personal history of pulmonary embolism 09/17/2020   Eosinophilia, unspecified 09/17/2020   Anemia, unspecified 09/17/2020   Cardiomyopathy,  unspecified (HCC) 09/17/2020   Diastolic heart failure (HCC) 09/17/2020   Hypo-osmolality and hyponatremia 09/17/2020   Long term (current) use of anticoagulants 09/17/2020    REFERRING DIAG: Balance problem, trouble with stairs  THERAPY DIAG:  Difficulty in walking, not elsewhere classified  Pain in right ankle and joints of right foot  Rationale for Evaluation and Treatment Rehabilitation  PERTINENT HISTORY: Pt describes that he is feeling unsteady when going upstairs. He does not regularly exercise and he would like to be shown exercises to strengthen his legs and improve his balance. He describes pain in his right foot along with swelling that has worsened over the past couple of weeks. He thinks that the swelling is caused by anxiety and he is getting it examined by a vascular specialist to rule out vascular pathology.   PRECAUTIONS: None   SUBJECTIVE:  SUBJECTIVE STATEMENT:  Pt reports that he had a COVID a couple of weeks ago and that he was unable to do exercises because of it. He has been feeling some pain in the right ankle but it is mostly swelling that he feels from time to time.    Are you having pain? No   OBJECTIVE: (objective measures completed at initial evaluation unless otherwise dated)  VITALS: BP 115/65 HR 91 SpO2 99   DIAGNOSTIC FINDINGS: No imaging report available but per podiatry note in January Dr. 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  Patient has pes planovalgus foot structure.        PATIENT SURVEYS:  FOTO 55/100 with target of 74    COGNITION: Overall cognitive status: Within functional  limits for tasks assessed                         SENSATION: WFL     MUSCLE LENGTH: Hamstrings:60 deg with restriction  Maisie Fus test: + Bilateral    POSTURE: rounded shoulders   EDEMA: Figure 8 Ankle R/L 70 cm/ 65 cm    PALPATION: No areas TTP    LOWER EXTREMITY ROM:         Active/Passive Right 07/30/2022 Left 07/30/2022  Hip flexion  120 120   Hip extension  30  30  Hip abduction  45 45   Hip adduction  30 30   Hip internal rotation      Hip external rotation      Knee flexion      Knee extension 0 0  Ankle dorsiflexion 10 10  Ankle plantarflexion 50/50 50/50  Ankle inversion 30/30 35/35  Ankle eversion 10/10 15/15   (Blank rows = not tested)        LOWER EXTREMITY MMT:  Not performed    MMT Right eval Left eval  Hip flexion  4+  4+  Hip extension  4-  4-  Hip abduction 4-  4-   Hip adduction      Hip internal rotation      Hip external rotation      Knee flexion  5 5   Knee extension  5 5   Ankle dorsiflexion  5 5   Ankle plantarflexion      Ankle inversion      Ankle eversion       (Blank rows = not tested)   LOWER EXTREMITY SPECIAL TESTS:  Not performed    FUNCTIONAL TESTS:  5 times sit to stand: 25 sec  30 seconds chair stand test: 6 reps  Dynamic Gait Index: 22/24 -Mild impairment with stairs and turning to stop          GAIT: Distance walked: 50 ft  Assistive device utilized: None Level of assistance: Complete Independence Comments: Left lateral trunk lean due to excessive pronation of right foot      TODAY'S TREATMENT:  DATE:   09/11/22:  FOTO: 57/100 with target of 59  5 X STS: 26 sec  MMT: Hip Abd R/L 4-/4, Hip Add R/L 4-/3+, Hip Ext R/L 3+/4  Sit to Stand from 18 inch chair height 1 x 10  4 inch runner step up with 1 UE support  2 x 10  6 inch runner step up with 1 UE support 2 x 10    Mini-Squats with water jug 3 x 10 -mod VC to decrease speed of eccentric phase and to remain upright with trunk  -Provided mirror for visual input   09/04/22:  THEREX:  Recumbent Bicycle 5 min at level 22  SLS on RLE 3 x 10 sec  -Pt unable to perform without use of UE support  Tandem with RLE as back foot 3 x 30 sec  Step Up on 4 inch platform with R foot as stance foot 1 x 10  Step Up on 6 inch platform with R foot as stance foot 1 x 10  Mini-Squat on 20 inch mat height 1 x 10  -min VC to decrease speed of concentric and eccentric portion of exercise  Standing Hip Abduction with 1 UE support 2 x 10  -min VC to control eccentric portion of exercise more   08/14/22: There.ex:   Screening R foot. Pt decline abnormal color changes to foot. Notable swelling in redness on R foot compared to L foot. R great toe blood blister appreciated on medial border. Pt declines pain changes or swelling changes with limb in dependent versus elevated positions. Further pronation of foot on R compared to L foot with full arch collapse from AP and PA with calcaneal eversion on R foot in PA. Palpable posterior tib and dorsal pedis pulses in R foot.    Education on Ankle AROM for muscle pumping along with limb elevation above heart level to assist in R foot swelling   Education on protecting blood blister with bandages when wearing shoe wear. Education to monitor for signs of infection like smells, color changes, further warmth of limb/great toe, pain.   Education on possible medial arch support OTC orthotic for improved foot position and may assist in foot placement in shoe to prevent blood blister from worsening. Encouraged to remove shoes and socks when at home and not ambulating to allow healing to blood blister and prevent maceration.    Supine with feet elevated via mat table to assist in edema management:    Ankle pumps: 2x20    RLE hip abduction: 1x20    RLE SLR: 1x20   SLS on RLE for intrinsic  foot strengthening: 2 finger support, 3x30 sec bouts. Supervision. Notable hip strategy and UE support ro maintain < 5 sec. On LLE able to maintain > 5 seconds.    Reviewed HEP. Pt understanding of HEP, understanding of education provided today.     PATIENT EDUCATION:  Education details: form and technique for appropriate exercise, signs/symptoms of infection, foot postioning and limb elevation to improve R foot edema.  Person educated: Patient Education method: Explanation, Demonstration, Verbal cues, and Handouts Education comprehension: verbalized understanding, returned demonstration, and verbal cues required   HOME EXERCISE PROGRAM: Access Code: [email or web address] URL: [email or web address] Date: 09/11/2022 Prepared by: Ellin Goodie  Exercises - Sit to Stand  - 3 x weekly - 3 sets - 10 reps - Supine Dynamic Modified Lenis Noon and Hip Flexor Dynamic Stretch  - 1 x daily - 3 reps - 30 sec  hold - Prone Quadriceps  Stretch with Strap  - 1 x daily - 3 reps - 30 sec  hold - Gastroc Stretch with Foot at Wall  - 1 x daily - 3 reps - 30-60 sec hold - Standing Hip Abduction  - 3 x weekly - 3 sets - 10 reps - Mini Squat  - 3 x weekly - 3 sets - 10 reps   ASSESSMENT:   CLINICAL IMPRESSION:  Pt has made limited progress with LE strength to date with no improvement in sit to stand time and minimal improvement with hip strength. HEP modified to include more exercises focused on quad and glute strengthening. Multi modal cuing provided through squats and sit to stands given pt's difficulty with sequencing exercise. He will continue to benefit from additional skilled PT intervention to improve LE strength to more safely negotiate stairs.   OBJECTIVE IMPAIRMENTS: Abnormal gait, decreased balance, decreased ROM, decreased strength, and pain.    ACTIVITY LIMITATIONS: squatting and stairs   PARTICIPATION LIMITATIONS: occupation   PERSONAL FACTORS: 3+ comorbidities: pes planus on right  foot along with midfoot OA, a-fib, chronic systolic hear failure    are also affecting patient's functional outcome.    REHAB POTENTIAL: Good   CLINICAL DECISION MAKING: Stable/uncomplicated   EVALUATION COMPLEXITY: Low     GOALS: Goals reviewed with patient? No   SHORT TERM GOALS: Target date: 08/13/2022  Pt will be independent with HEP in order to improve strength and balance in order to decrease fall risk and improve function at home and work. Baseline: Completing independently  Goal status: Ongoing    Pt will decrease 5TSTS by at least 3 seconds in order to demonstrate clinically significant improvement in LE strength. Baseline: 25 sec  09/11/22: 26 sec  Goal status: Ongoing      LONG TERM GOALS: Target date: 10/08/2022    Patient will have improved function and activity level as evidenced by an increase in FOTO score by 10 points or more.  Baseline: 55/100 with target of 59 09/11/22: 57/100 Goal status: Ongoing    2.  Patient will improve hip strength by 1/3 MMT grade for improve LE function in order to more easily and safely negotiate stairs.  Baseline: Hip Abd R/L 4-/4-, Hip Add R/L 4-/4-, Hip Ext R/L 4-/4-  09/11/22: Hip Abd R/L 4-/4, Hip Add R/L 4-/3+, Hip Ext R/L 3+/4 Goal status: Ongoing    3.  Patient will improve 5 x STS to <12 sec to demonstrate LE strength that places him at a decreased risk for falls. Baseline: 25 sec 09/11/22: 26 sec  Goal status: Ongoing    4.  Patient will perform >=10 sit to stand reps as evidence of improved LE endurance and ability to negative stairs more easily and more safely.  Baseline: 6 reps  Goal status: Ongoing    5.  Patient will negotiate stairs without use of railings and with step over step as evidence of improve stability while negotiating stairs.  Baseline: Uses bilateral railings with step over step  Goal status: Ongoing        PLAN:   PT FREQUENCY: 1-2x/week   PT DURATION: 10 weeks   PLANNED INTERVENTIONS:  Therapeutic exercises, Neuromuscular re-education, Balance training, Gait training, Joint mobilization, Joint manipulation, Stair training, Orthotic/Fit training, DME instructions, Aquatic Therapy, Dry Needling, Electrical stimulation, Cryotherapy, Moist heat, Manual therapy, and Re-evaluation   PLAN FOR NEXT SESSION: 30 sec chair stands and negotiate stairs.  Ankle inversion strengthening for flat feet: seated banded inversion, bent knee  stengthening with ball held between ankles etc.  Progress hip strengthening exercises with functional context of stairs: step ups and side step ups.   Ellin Goodieaniel Neil Errickson PT, DPT   Physical Therapist- [facility]  [facility]  09/11/2022, 11:13 AM

## 2022-09-13 ENCOUNTER — Encounter: Payer: BC Managed Care – PPO | Admitting: Physical Therapy

## 2022-09-18 ENCOUNTER — Ambulatory Visit: Payer: BC Managed Care – PPO | Admitting: Physical Therapy

## 2022-09-20 ENCOUNTER — Encounter: Payer: BC Managed Care – PPO | Admitting: Physical Therapy

## 2022-09-24 ENCOUNTER — Encounter: Payer: Self-pay | Admitting: Physical Therapy

## 2022-09-24 ENCOUNTER — Ambulatory Visit: Payer: BC Managed Care – PPO | Attending: Nurse Practitioner | Admitting: Physical Therapy

## 2022-09-24 DIAGNOSIS — M25571 Pain in right ankle and joints of right foot: Secondary | ICD-10-CM | POA: Diagnosis not present

## 2022-09-24 DIAGNOSIS — R262 Difficulty in walking, not elsewhere classified: Secondary | ICD-10-CM | POA: Insufficient documentation

## 2022-09-24 NOTE — Therapy (Signed)
OUTPATIENT PHYSICAL THERAPY TREATMENT NOTE   Patient Name: Curtis Hodges MRN: [id number] DOB:1971/04/06, 52 y.o., male Today's Date: 09/24/2022  PCP: Della Goo FNP  REFERRING PROVIDER: Della Goo FNP   END OF SESSION:   PT End of Session - 09/24/22 1117     Visit Number 6    Number of Visits 20    Date for PT Re-Evaluation 10/08/22    Authorization Type [employer]    Authorization Time Period 07/30/22-10/08/2022    Authorization - Visit Number 6    Authorization - Number of Visits 20    Progress Note Due on Visit 10    PT Start Time 1115    PT Stop Time 1145    PT Time Calculation (min) 30 min    Equipment Utilized During Treatment Gait belt    Activity Tolerance Patient tolerated treatment well    Behavior During Therapy WFL for tasks assessed/performed             Past Medical History:  Diagnosis Date   Atrial fibrillation (HCC)    Hypertension    Thyroid disease    Past Surgical History:  Procedure Laterality Date   CHOLECYSTECTOMY     GALLBLADDER SURGERY     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   Longstanding persistent atrial fibrillation (HCC) 04/25/2021   Medication management 02/14/2021   Chronic systolic heart failure (HCC) 02/03/2021   Primary hypertension 02/03/2021   Bradycardia, unspecified 01/20/2021   Hypotension 01/09/2021   Single subsegmental pulmonary embolism without acute cor pulmonale (HCC) 01/09/2021   Swelling of toe of right foot 01/09/2021   NAFL (nonalcoholic fatty liver) 12/27/2020   Bipolar disorder, unspecified (HCC) 09/17/2020   Hypothyroidism, unspecified 09/17/2020   Unspecified atrial fibrillation (HCC) 09/17/2020   Personal history of pulmonary embolism 09/17/2020   Eosinophilia, unspecified 09/17/2020   Anemia, unspecified 09/17/2020   Cardiomyopathy,  unspecified (HCC) 09/17/2020   Diastolic heart failure (HCC) 09/17/2020   Hypo-osmolality and hyponatremia 09/17/2020   Long term (current) use of anticoagulants 09/17/2020    REFERRING DIAG: Balance problem, trouble with stairs  THERAPY DIAG:  Difficulty in walking, not elsewhere classified  Pain in right ankle and joints of right foot  Rationale for Evaluation and Treatment Rehabilitation  PERTINENT HISTORY: Pt describes that he is feeling unsteady when going upstairs. He does not regularly exercise and he would like to be shown exercises to strengthen his legs and improve his balance. He describes pain in his right foot along with swelling that has worsened over the past couple of weeks. He thinks that the swelling is caused by anxiety and he is getting it examined by a vascular specialist to rule out vascular pathology.   PRECAUTIONS: None   SUBJECTIVE:  SUBJECTIVE STATEMENT:  Pt reports continuing to feel rundown since having COVID a couple of weeks ago and that he has not been able to do exercises. He has not felt pain in his ankle and not experienced swelling for the past couple of weeks.    Are you having pain? No   OBJECTIVE: (objective measures completed at initial evaluation unless otherwise dated)  VITALS: BP 115/65 HR 91 SpO2 99   DIAGNOSTIC FINDINGS: No imaging report available but per podiatry note in January Dr. 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  Patient has pes planovalgus foot structure.        PATIENT SURVEYS:  FOTO 55/100 with target of 72    COGNITION: Overall cognitive status: Within  functional limits for tasks assessed                         SENSATION: WFL     MUSCLE LENGTH: Hamstrings:60 deg with restriction  Maisie Fus test: + Bilateral    POSTURE: rounded shoulders   EDEMA: Figure 8 Ankle R/L 70 cm/ 65 cm    PALPATION: No areas TTP    LOWER EXTREMITY ROM:         Active/Passive Right 07/30/2022 Left 07/30/2022  Hip flexion  120 120   Hip extension  30  30  Hip abduction  45 45   Hip adduction  30 30   Hip internal rotation      Hip external rotation      Knee flexion      Knee extension 0 0  Ankle dorsiflexion 10 10  Ankle plantarflexion 50/50 50/50  Ankle inversion 30/30 35/35  Ankle eversion 10/10 15/15   (Blank rows = not tested)        LOWER EXTREMITY MMT:  Not performed    MMT Right eval Left eval  Hip flexion  4+  4+  Hip extension  4-  4-  Hip abduction 4-  4-   Hip adduction      Hip internal rotation      Hip external rotation      Knee flexion  5 5   Knee extension  5 5   Ankle dorsiflexion  5 5   Ankle plantarflexion      Ankle inversion      Ankle eversion       (Blank rows = not tested)   LOWER EXTREMITY SPECIAL TESTS:  Not performed    FUNCTIONAL TESTS:  5 times sit to stand: 25 sec  30 seconds chair stand test: 6 reps  Dynamic Gait Index: 22/24 -Mild impairment with stairs and turning to stop          GAIT: Distance walked: 50 ft  Assistive device utilized: None Level of assistance: Complete Independence Comments: Left lateral trunk lean due to excessive pronation of right foot      TODAY'S TREATMENT:  DATE:   09/24/22: Matrix Recumbent Bicycle 5 min with level 18 for seat  30 sec chair stand: 8 reps  Seated Figure 4 Ankle Inversion on Left with yellow band 3 x 15  Standing Heel Raises with ball grasp at heels 3 x 10  -mod VC and TC to position feet and ball so pt could  perform correctly   09/11/22:  FOTO: 57/100 with target of 59  5 X STS: 26 sec  MMT: Hip Abd R/L 4-/4, Hip Add R/L 4-/3+, Hip Ext R/L 3+/4  Sit to Stand from 18 inch chair height 1 x 10  4 inch runner step up with 1 UE support  2 x 10  6 inch runner step up with 1 UE support 2 x 10   Mini-Squats with water jug 3 x 10 -mod VC to decrease speed of eccentric phase and to remain upright with trunk  -Provided mirror for visual input   09/04/22:  THEREX:  Recumbent Bicycle 5 min at level 22  SLS on RLE 3 x 10 sec  -Pt unable to perform without use of UE support  Tandem with RLE as back foot 3 x 30 sec  Step Up on 4 inch platform with R foot as stance foot 1 x 10  Step Up on 6 inch platform with R foot as stance foot 1 x 10  Mini-Squat on 20 inch mat height 1 x 10  -min VC to decrease speed of concentric and eccentric portion of exercise  Standing Hip Abduction with 1 UE support 2 x 10  -min VC to control eccentric portion of exercise more   08/14/22: There.ex:   Screening R foot. Pt decline abnormal color changes to foot. Notable swelling in redness on R foot compared to L foot. R great toe blood blister appreciated on medial border. Pt declines pain changes or swelling changes with limb in dependent versus elevated positions. Further pronation of foot on R compared to L foot with full arch collapse from AP and PA with calcaneal eversion on R foot in PA. Palpable posterior tib and dorsal pedis pulses in R foot.    Education on Ankle AROM for muscle pumping along with limb elevation above heart level to assist in R foot swelling   Education on protecting blood blister with bandages when wearing shoe wear. Education to monitor for signs of infection like smells, color changes, further warmth of limb/great toe, pain.   Education on possible medial arch support OTC orthotic for improved foot position and may assist in foot placement in shoe to prevent blood blister from worsening.  Encouraged to remove shoes and socks when at home and not ambulating to allow healing to blood blister and prevent maceration.    Supine with feet elevated via mat table to assist in edema management:    Ankle pumps: 2x20    RLE hip abduction: 1x20    RLE SLR: 1x20   SLS on RLE for intrinsic foot strengthening: 2 finger support, 3x30 sec bouts. Supervision. Notable hip strategy and UE support ro maintain < 5 sec. On LLE able to maintain > 5 seconds.    Reviewed HEP. Pt understanding of HEP, understanding of education provided today.     PATIENT EDUCATION:  Education details: form and technique for appropriate exercise, signs/symptoms of infection, foot postioning and limb elevation to improve R foot edema.  Person educated: Patient Education method: Explanation, Demonstration, Verbal cues, and Handouts Education comprehension: verbalized understanding, returned demonstration, and verbal cues  required   HOME EXERCISE PROGRAM: Access Code: [email or web address] URL: [email or web address] Date: 09/24/2022 Prepared by: Ellin Goodie  Exercises - Sit to Stand  - 3 x weekly - 3 sets - 10 reps - Supine Dynamic Modified Lenis Noon and Hip Flexor Dynamic Stretch  - 1 x daily - 3 reps - 30 sec  hold - Prone Quadriceps Stretch with Strap  - 1 x daily - 3 reps - 30 sec  hold - Gastroc Stretch with Foot at Wall  - 1 x daily - 3 reps - 30-60 sec hold - Standing Hip Abduction  - 3 x weekly - 3 sets - 10 reps - Mini Squat  - 3 x weekly - 3 sets - 10 reps - Seated Figure 4 Ankle Inversion with Resistance  - 3 x weekly - 3 sets - 15 reps - Standing Calf Raise With Small Ball at Heels  - 3 x weekly - 3 sets - 15 reps - Seated Calf Stretch with Strap  - 1 x daily - 3 reps - 30 sec  hold   ASSESSMENT:   CLINICAL IMPRESSION:  Pt continues to show some progress towards goals with an improvement in his LE endurance and strength with increased sit to stand reps on 30 sec chair stands and ability to  negotiate stairs without UE support. He continues to require mod multimodal cueing to perform exercises correctly. He will continue to benefit from additional skilled PT intervention to improve LE strength to more safely negotiate stairs.    OBJECTIVE IMPAIRMENTS: Abnormal gait, decreased balance, decreased ROM, decreased strength, and pain.    ACTIVITY LIMITATIONS: squatting and stairs   PARTICIPATION LIMITATIONS: occupation   PERSONAL FACTORS: 3+ comorbidities: pes planus on right foot along with midfoot OA, a-fib, chronic systolic hear failure    are also affecting patient's functional outcome.    REHAB POTENTIAL: Good   CLINICAL DECISION MAKING: Stable/uncomplicated   EVALUATION COMPLEXITY: Low     GOALS: Goals reviewed with patient? No   SHORT TERM GOALS: Target date: 08/13/2022  Pt will be independent with HEP in order to improve strength and balance in order to decrease fall risk and improve function at home and work. Baseline: Completing independently  Goal status: Ongoing    Pt will decrease 5TSTS by at least 3 seconds in order to demonstrate clinically significant improvement in LE strength. Baseline: 25 sec  09/11/22: 26 sec  Goal status: Ongoing      LONG TERM GOALS: Target date: 10/08/2022    Patient will have improved function and activity level as evidenced by an increase in FOTO score by 10 points or more.  Baseline: 55/100 with target of 59 09/11/22: 57/100 Goal status: Ongoing    2.  Patient will improve hip strength by 1/3 MMT grade for improve LE function in order to more easily and safely negotiate stairs.  Baseline: Hip Abd R/L 4-/4-, Hip Add R/L 4-/4-, Hip Ext R/L 4-/4-  09/11/22: Hip Abd R/L 4-/4, Hip Add R/L 4-/3+, Hip Ext R/L 3+/4 Goal status: Ongoing    3.  Patient will improve 5 x STS to <12 sec to demonstrate LE strength that places him at a decreased risk for falls. Baseline: 25 sec 09/11/22: 26 sec  Goal status: Ongoing    4.  Patient will  perform >=10 sit to stand reps as evidence of improved LE endurance and ability to negative stairs more easily and more safely.  Baseline: 6 reps 09/24/22:8 reps  Goal status: Ongoing  5.  Patient will negotiate stairs without use of railings and with step over step as evidence of improve stability while negotiating stairs.  Baseline: Uses bilateral railings with step over step  09/24/22: Step to without use of railings Goal status: Ongoing        PLAN:   PT FREQUENCY: 1-2x/week   PT DURATION: 10 weeks   PLANNED INTERVENTIONS: Therapeutic exercises, Neuromuscular re-education, Balance training, Gait training, Joint mobilization, Joint manipulation, Stair training, Orthotic/Fit training, DME instructions, Aquatic Therapy, Dry Needling, Electrical stimulation, Cryotherapy, Moist heat, Manual therapy, and Re-evaluation   PLAN FOR NEXT SESSION:   Progress hip strengthening exercises with functional context of stairs: step ups and side step ups.   Bradly Chris PT, DPT   Physical Therapist- [facility]  09/24/2022, 11:18 AM

## 2022-09-26 ENCOUNTER — Other Ambulatory Visit (INDEPENDENT_AMBULATORY_CARE_PROVIDER_SITE_OTHER): Payer: Self-pay | Admitting: Nurse Practitioner

## 2022-09-26 ENCOUNTER — Encounter: Payer: BC Managed Care – PPO | Admitting: Physical Therapy

## 2022-09-26 DIAGNOSIS — R2241 Localized swelling, mass and lump, right lower limb: Secondary | ICD-10-CM

## 2022-09-29 DIAGNOSIS — I872 Venous insufficiency (chronic) (peripheral): Secondary | ICD-10-CM | POA: Insufficient documentation

## 2022-09-29 DIAGNOSIS — I890 Lymphedema, not elsewhere classified: Secondary | ICD-10-CM | POA: Insufficient documentation

## 2022-09-29 NOTE — Progress Notes (Unsigned)
MRN : [id number]  Curtis Hodges is a 52 y.o. ([birth year]) male who presents with chief complaint of varicose veins hurt.  History of Present Illness:  The patient returns to the office for followup evaluation regarding swelling of both lower extremities but predominantly right foot and ankle swelling.  The swelling has persisted and the pain associated with swelling continues. There have not been any interval development of a ulcerations or wounds.  Since the previous visit the patient has been wearing graduated compression stockings and has noted little if any improvement in the lymphedema. The patient has been using compression routinely morning until night.  The patient also states elevation during the day and exercise is being done too.  Duplex ultrasound of the bilateral lower extremity venous system demonstrates normal deep venous system bilaterally.  No evidence of superficial reflux is identified of either lower extremity.   No outpatient medications have been marked as taking for the 10/01/22 encounter (Appointment) with Gilda Crease, Latina Craver, MD.    Past Medical History:  Diagnosis Date   Atrial fibrillation (HCC)    Hypertension    Thyroid disease     Past Surgical History:  Procedure Laterality Date   CHOLECYSTECTOMY     GALLBLADDER SURGERY      Social History Social History   Tobacco Use   Smoking status: Never   Smokeless tobacco: Never  Vaping Use   Vaping Use: Never used  Substance Use Topics   Alcohol use: Not Currently   Drug use: Never    Family History Family History  Problem Relation Age of Onset   COPD Mother    Atrial fibrillation Mother    Brain cancer Father     Allergies  Allergen Reactions   Cat Hair Extract      REVIEW OF SYSTEMS (Negative unless checked)  Constitutional: [] Weight loss  [] Fever  [] Chills Cardiac: [] Chest pain   [] Chest pressure   [] Palpitations   [] Shortness of breath when laying flat   [] Shortness of  breath with exertion. Vascular:  [] Pain in legs with walking   [x] Pain in legs with standing  [] History of DVT   [] Phlebitis   [] Swelling in legs   [x] Varicose veins   [] Non-healing ulcers Pulmonary:   [] Uses home oxygen   [] Productive cough   [] Hemoptysis   [] Wheeze  [] COPD   [] Asthma Neurologic:  [] Dizziness   [] Seizures   [] History of stroke   [] History of TIA  [] Aphasia   [] Vissual changes   [] Weakness or numbness in arm   [] Weakness or numbness in leg Musculoskeletal:   [] Joint swelling   [] Joint pain   [] Low back pain Hematologic:  [] Easy bruising  [] Easy bleeding   [] Hypercoagulable state   [] Anemic Gastrointestinal:  [] Diarrhea   [] Vomiting  [] Gastroesophageal reflux/heartburn   [] Difficulty swallowing. Genitourinary:  [] Chronic kidney disease   [] Difficult urination  [] Frequent urination   [] Blood in urine Skin:  [] Rashes   [] Ulcers  Psychological:  [] History of anxiety   []  History of major depression.  Physical Examination  There were no vitals filed for this visit. There is no height or weight on file to calculate BMI. Gen: WD/WN, NAD Head: [place]/AT, No temporalis wasting.  Ear/Nose/Throat: Hearing grossly intact, nares w/o erythema or drainage, pinna without lesions Eyes: PER, EOMI, sclera nonicteric.  Neck: Supple, no gross masses.  No JVD.  Pulmonary:  Good air movement, no audible wheezing, no use of accessory muscles.  Cardiac: RRR, precordium not hyperdynamic. Vascular:  Mild venous stasis changes to the legs bilaterally.  3-4+ soft pitting edema CEAP C3sEpAsPr Vessel Right Left  Radial Palpable Palpable  Gastrointestinal: soft, non-distended. No guarding/no peritoneal signs.  Musculoskeletal: M/S 5/5 throughout.  No deformity.  Neurologic: CN 2-12 intact. Pain and light touch intact in extremities.  Symmetrical.  Speech is fluent. Motor exam as listed above. Psychiatric: Judgment intact, Mood & affect appropriate for pt's clinical situation. Dermatologic: Venous rashes  no ulcers noted.  No changes consistent with cellulitis. Lymph : No lichenification or skin changes of chronic lymphedema.  CBC Lab Results  Component Value Date   WBC 6.4 07/23/2022   HGB 14.0 07/23/2022   HCT 41.2 07/23/2022   MCV 94.5 07/23/2022   PLT 297 07/23/2022    BMET    Component Value Date/Time   NA 141 07/23/2022 1022   K 4.2 07/23/2022 1022   CL 106 07/23/2022 1022   CO2 27 07/23/2022 1022   GLUCOSE 98 07/23/2022 1022   BUN 20 07/23/2022 1022   CREATININE 0.82 07/23/2022 1022   CALCIUM 9.5 07/23/2022 1022   CrCl cannot be calculated (Patient's most recent lab result is older than the maximum 21 days allowed.).  COAG No results found for: "INR", "PROTIME"  Radiology No results found.   Assessment/Plan 1. Lymphedema Recommend:  No surgery or intervention at this point in time.   The Patient is CEAP C4sEpAsPr.  The patient has been wearing compression for more than 12 weeks with no or little benefit.  The patient has been exercising daily for more than 12 weeks. The patient has been elevating and taking OTC pain medications for more than 12 weeks.  None of these have have eliminated the pain related to the lymphedema or the discomfort regarding excessive swelling and venous congestion.    I have reviewed my discussion with the patient regarding lymphedema and why it  causes symptoms.  Patient will continue wearing graduated compression on a daily basis. The patient should put the compression on first thing in the morning and removing them in the evening. The patient should not sleep in the compression.   In addition, behavioral modification throughout the day will be continued.  This will include frequent elevation (such as in a recliner), use of over the counter pain medications as needed and exercise such as walking.  The systemic causes for chronic edema such as liver, kidney and cardiac etiologies do not appear to have significant changed over the past  year.    The patient has chronic , severe lymphedema with hyperpigmentation of the skin and has done MLD, skin care, medication, diet, exercise, elevation and compression for 4 weeks with no improvement,  I am recommending a lymphedema pump.  The patient still has stage 3 lymphedema and therefore, I believe that a lymph pump is needed to improve the control of the patient's lymphedema and improve the quality of life.  Additionally, a lymph pump is warranted because it will reduce the risk of cellulitis and ulceration in the future.  Patient should follow-up in six months   2. Chronic venous insufficiency Recommend:  No surgery or intervention at this point in time.   The Patient is CEAP C4sEpAsPr.  The patient has been wearing compression for more than 12 weeks with no or little benefit.  The patient has been exercising daily for more than 12 weeks. The patient has been elevating and taking OTC pain medications for more than 12 weeks.  None of these have have eliminated  the pain related to the lymphedema or the discomfort regarding excessive swelling and venous congestion.    I have reviewed my discussion with the patient regarding lymphedema and why it  causes symptoms.  Patient will continue wearing graduated compression on a daily basis. The patient should put the compression on first thing in the morning and removing them in the evening. The patient should not sleep in the compression.   In addition, behavioral modification throughout the day will be continued.  This will include frequent elevation (such as in a recliner), use of over the counter pain medications as needed and exercise such as walking.  The systemic causes for chronic edema such as liver, kidney and cardiac etiologies do not appear to have significant changed over the past year.    The patient has chronic , severe lymphedema with hyperpigmentation of the skin and has done MLD, skin care, medication, diet, exercise, elevation  and compression for 4 weeks with no improvement,  I am recommending a lymphedema pump.  The patient still has stage 3 lymphedema and therefore, I believe that a lymph pump is needed to improve the control of the patient's lymphedema and improve the quality of life.  Additionally, a lymph pump is warranted because it will reduce the risk of cellulitis and ulceration in the future.  Patient should follow-up in six months   3. Atrial fibrillation, unspecified type ([place]) Continue antiarrhythmia medications as already ordered, these medications have been reviewed and there are no changes at this time.  Continue anticoagulation as ordered by Cardiology Service  4. Primary hypertension Continue antihypertensive medications as already ordered, these medications have been reviewed and there are no changes at this time.  5. Hypothyroidism, unspecified type Continue hormone placement as ordered and reviewed, no changes at this time  6. Localized swelling of right foot See # 1& 2 - VAS [place] LOWER EXTREMITY VENOUS REFLUX    Hortencia Pilar, MD  09/29/2022 2:37 PM

## 2022-10-01 ENCOUNTER — Encounter (INDEPENDENT_AMBULATORY_CARE_PROVIDER_SITE_OTHER): Payer: Self-pay | Admitting: Vascular Surgery

## 2022-10-01 ENCOUNTER — Ambulatory Visit: Payer: BC Managed Care – PPO | Admitting: Physical Therapy

## 2022-10-01 ENCOUNTER — Ambulatory Visit (INDEPENDENT_AMBULATORY_CARE_PROVIDER_SITE_OTHER): Payer: BC Managed Care – PPO | Admitting: Vascular Surgery

## 2022-10-01 ENCOUNTER — Ambulatory Visit (INDEPENDENT_AMBULATORY_CARE_PROVIDER_SITE_OTHER): Payer: BC Managed Care – PPO

## 2022-10-01 VITALS — BP 120/80 | HR 82 | Ht 77.0 in | Wt 229.0 lb

## 2022-10-01 DIAGNOSIS — I872 Venous insufficiency (chronic) (peripheral): Secondary | ICD-10-CM

## 2022-10-01 DIAGNOSIS — I4891 Unspecified atrial fibrillation: Secondary | ICD-10-CM

## 2022-10-01 DIAGNOSIS — E039 Hypothyroidism, unspecified: Secondary | ICD-10-CM

## 2022-10-01 DIAGNOSIS — R2241 Localized swelling, mass and lump, right lower limb: Secondary | ICD-10-CM | POA: Diagnosis not present

## 2022-10-01 DIAGNOSIS — I10 Essential (primary) hypertension: Secondary | ICD-10-CM | POA: Diagnosis not present

## 2022-10-01 DIAGNOSIS — I890 Lymphedema, not elsewhere classified: Secondary | ICD-10-CM

## 2022-10-04 ENCOUNTER — Telehealth (INDEPENDENT_AMBULATORY_CARE_PROVIDER_SITE_OTHER): Payer: Self-pay

## 2022-10-04 ENCOUNTER — Encounter: Payer: BC Managed Care – PPO | Admitting: Physical Therapy

## 2022-10-04 NOTE — Telephone Encounter (Signed)
This was received from Kern Alberta at [place]:  Aileen Pilot, we just spoke to patient and patient seemed to have some mental decline. He said "no this is not supposed to happen for 6 months. The doctor specifically said this would happen in July. This is not happening right now." I told him he would most likely have to go back for another appointment in July and get another referral with new notes sent over if he were to wait that long.  He stated, "I'm not going back again because that costs money but this is not supposed to happen till July like my doctor said."  Please let me know what you and Dr.Schnier would like me to do.  Thank you.    Per Dr. Delana Meyer will note that the patient has declined and will assist if he reaches out in future.

## 2022-10-09 ENCOUNTER — Ambulatory Visit: Payer: BC Managed Care – PPO | Admitting: Physical Therapy

## 2022-10-09 DIAGNOSIS — I951 Orthostatic hypotension: Secondary | ICD-10-CM | POA: Diagnosis not present

## 2022-10-09 DIAGNOSIS — I4811 Longstanding persistent atrial fibrillation: Secondary | ICD-10-CM | POA: Diagnosis not present

## 2022-10-11 ENCOUNTER — Encounter: Payer: BC Managed Care – PPO | Admitting: Physical Therapy

## 2022-10-15 ENCOUNTER — Ambulatory Visit: Payer: BC Managed Care – PPO

## 2022-10-15 DIAGNOSIS — M25571 Pain in right ankle and joints of right foot: Secondary | ICD-10-CM | POA: Diagnosis not present

## 2022-10-15 DIAGNOSIS — R262 Difficulty in walking, not elsewhere classified: Secondary | ICD-10-CM | POA: Diagnosis not present

## 2022-10-15 NOTE — Therapy (Signed)
OUTPATIENT PHYSICAL THERAPY TREATMENT NOTE   Patient Name: Curtis Hodges MRN: [id number] DOB:03-09-1971, 52 y.o., male Today's Date: 10/15/2022  PCP: Della Goo FNP  REFERRING PROVIDER: Della Goo FNP   END OF SESSION:   PT End of Session - 10/15/22 1152     Visit Number 7    Number of Visits 32    Date for PT Re-Evaluation 11/29/22    Authorization Type [employer]    Authorization Time Period --    Authorization - Number of Visits 20    Progress Note Due on Visit 10    PT Start Time 1152    PT Stop Time 1233    PT Time Calculation (min) 41 min    Equipment Utilized During Treatment Gait belt    Activity Tolerance Patient tolerated treatment well    Behavior During Therapy WFL for tasks assessed/performed              Past Medical History:  Diagnosis Date   Atrial fibrillation (HCC)    Hypertension    Thyroid disease    Past Surgical History:  Procedure Laterality Date   CHOLECYSTECTOMY     GALLBLADDER SURGERY     Patient Active Problem List   Diagnosis Date Noted   Chronic venous insufficiency 09/29/2022   Lymphedema 09/29/2022   Cellulitis of foot, left 08/11/2021   Bilateral edema of lower extremity 05/04/2021   Adrenal nodule (HCC) 05/04/2021   Onychodystrophy 05/04/2021   Longstanding persistent atrial fibrillation (HCC) 04/25/2021   Medication management 02/14/2021   Chronic systolic heart failure (HCC) 02/03/2021   Primary hypertension 02/03/2021   Bradycardia, unspecified 01/20/2021   Hypotension 01/09/2021   Single subsegmental pulmonary embolism without acute cor pulmonale (HCC) 01/09/2021   Swelling of toe of right foot 01/09/2021   NAFL (nonalcoholic fatty liver) 12/27/2020   Bipolar disorder, unspecified (HCC) 09/17/2020   Hypothyroidism, unspecified 09/17/2020   Unspecified atrial fibrillation (HCC) 09/17/2020   Personal history of pulmonary embolism 09/17/2020   Eosinophilia, unspecified 09/17/2020   Anemia, unspecified 09/17/2020    Cardiomyopathy, unspecified (HCC) 09/17/2020   Diastolic heart failure (HCC) 09/17/2020   Hypo-osmolality and hyponatremia 09/17/2020   Long term (current) use of anticoagulants 09/17/2020    REFERRING DIAG: Balance problem, trouble with stairs  THERAPY DIAG:  Difficulty in walking, not elsewhere classified - Plan: PT plan of care cert/re-cert  Pain in right ankle and joints of right foot - Plan: PT plan of care cert/re-cert  Rationale for Evaluation and Treatment Rehabilitation  PERTINENT HISTORY: Pt describes that he is feeling unsteady when going upstairs. He does not regularly exercise and he would like to be shown exercises to strengthen his legs and improve his balance. He describes pain in his right foot along with swelling that has worsened over the past couple of weeks. He thinks that the swelling is caused by anxiety and he is getting it examined by a vascular specialist to rule out vascular pathology.   PRECAUTIONS: None   SUBJECTIVE:  SUBJECTIVE STATEMENT:  No pain or discomfort. There is swelling in his R foot. Balance is feeling a lot better. Would like to come in for physical therapy some more.    Are you having pain? No   OBJECTIVE: (objective measures completed at initial evaluation unless otherwise dated)  VITALS: BP 115/65 HR 91 SpO2 99   DIAGNOSTIC FINDINGS: No imaging report available but per podiatry note in January Dr. 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  Patient has pes planovalgus foot structure.        PATIENT SURVEYS:  FOTO 55/100 with target of 88    COGNITION: Overall cognitive status:  Within functional limits for tasks assessed                         SENSATION: WFL     MUSCLE LENGTH: Hamstrings:60 deg with restriction  Maisie Fus test: + Bilateral    POSTURE: rounded shoulders   EDEMA: Figure 8 Ankle R/L 70 cm/ 65 cm    PALPATION: No areas TTP    LOWER EXTREMITY ROM:         Active/Passive Right 07/30/2022 Left 07/30/2022  Hip flexion  120 120   Hip extension  30  30  Hip abduction  45 45   Hip adduction  30 30   Hip internal rotation      Hip external rotation      Knee flexion      Knee extension 0 0  Ankle dorsiflexion 10 10  Ankle plantarflexion 50/50 50/50  Ankle inversion 30/30 35/35  Ankle eversion 10/10 15/15   (Blank rows = not tested)        LOWER EXTREMITY MMT:  Not performed    MMT Right eval Left eval  Hip flexion  4+  4+  Hip extension  4-  4-  Hip abduction 4-  4-   Hip adduction      Hip internal rotation      Hip external rotation      Knee flexion  5 5   Knee extension  5 5   Ankle dorsiflexion  5 5   Ankle plantarflexion      Ankle inversion      Ankle eversion       (Blank rows = not tested)   LOWER EXTREMITY SPECIAL TESTS:  Not performed    FUNCTIONAL TESTS:  5 times sit to stand: 25 sec  30 seconds chair stand test: 6 reps  Dynamic Gait Index: 22/24 -Mild impairment with stairs and turning to stop          GAIT: Distance walked: 50 ft  Assistive device utilized: None Level of assistance: Complete Independence Comments: Left lateral trunk lean due to excessive pronation of right foot      TODAY'S TREATMENT:  DATE:    10/15/22: Sit <> stand from regular chair 5x (sitting down completely each time) 19 seconds with B UE assist. Unsteady. Feels weak in R LE   Sit <> stand 30 seconds (able to perform 10x with B UE assist)   Ascending and descending 4 regular steps  2x  Manually resisted S/L hip abduction, hip adduction, prone hip extension  4 inch step up with 1 UE support  2 x 10 R and L LE each (able to perform second set without UE assist but with PT CGA   6 inch step up with no UE support x 10 R and L LE each   Reviewed POC: continue another 2x/week for 6 weeks.     Improved exercise technique, movement at target joints, use of target muscles after mod verbal, visual, tactile cues.           09/24/22: Matrix Recumbent Bicycle 5 min with level 18 for seat  30 sec chair stand: 8 reps  Seated Figure 4 Ankle Inversion on Left with yellow band 3 x 15  Standing Heel Raises with ball grasp at heels 3 x 10  -mod VC and TC to position feet and ball so pt could perform correctly   09/11/22:  FOTO: 57/100 with target of 59  5 X STS: 26 sec  MMT: Hip Abd R/L 4-/4, Hip Add R/L 4-/3+, Hip Ext R/L 3+/4  Sit to Stand from 18 inch chair height 1 x 10  4 inch runner step up with 1 UE support  2 x 10  6 inch runner step up with 1 UE support 2 x 10   Mini-Squats with water jug 3 x 10 -mod VC to decrease speed of eccentric phase and to remain upright with trunk  -Provided mirror for visual input   09/04/22:  THEREX:  Recumbent Bicycle 5 min at level 22  SLS on RLE 3 x 10 sec  -Pt unable to perform without use of UE support  Tandem with RLE as back foot 3 x 30 sec  Step Up on 4 inch platform with R foot as stance foot 1 x 10  Step Up on 6 inch platform with R foot as stance foot 1 x 10  Mini-Squat on 20 inch mat height 1 x 10  -min VC to decrease speed of concentric and eccentric portion of exercise  Standing Hip Abduction with 1 UE support 2 x 10  -min VC to control eccentric portion of exercise more   08/14/22: There.ex:   Screening R foot. Pt decline abnormal color changes to foot. Notable swelling in redness on R foot compared to L foot. R great toe blood blister appreciated on medial border. Pt declines pain changes or swelling  changes with limb in dependent versus elevated positions. Further pronation of foot on R compared to L foot with full arch collapse from AP and PA with calcaneal eversion on R foot in PA. Palpable posterior tib and dorsal pedis pulses in R foot.    Education on Ankle AROM for muscle pumping along with limb elevation above heart level to assist in R foot swelling   Education on protecting blood blister with bandages when wearing shoe wear. Education to monitor for signs of infection like smells, color changes, further warmth of limb/great toe, pain.   Education on possible medial arch support OTC orthotic for improved foot position and may assist in foot placement in shoe to prevent blood blister from worsening. Encouraged to remove shoes  and socks when at home and not ambulating to allow healing to blood blister and prevent maceration.    Supine with feet elevated via mat table to assist in edema management:    Ankle pumps: 2x20    RLE hip abduction: 1x20    RLE SLR: 1x20   SLS on RLE for intrinsic foot strengthening: 2 finger support, 3x30 sec bouts. Supervision. Notable hip strategy and UE support ro maintain < 5 sec. On LLE able to maintain > 5 seconds.    Reviewed HEP. Pt understanding of HEP, understanding of education provided today.     PATIENT EDUCATION:  Education details: form and technique for appropriate exercise, signs/symptoms of infection, foot postioning and limb elevation to improve R foot edema.  Person educated: Patient Education method: Explanation, Demonstration, Verbal cues, and Handouts Education comprehension: verbalized understanding, returned demonstration, and verbal cues required   HOME EXERCISE PROGRAM: Access Code: [email or web address] URL: [email or web address] Date: 09/24/2022 Prepared by: Bradly Chris  Exercises - Sit to Stand  - 3 x weekly - 3 sets - 10 reps - Supine Dynamic Modified Karl Pock and Hip Flexor Dynamic Stretch  - 1 x daily - 3 reps  - 30 sec  hold - Prone Quadriceps Stretch with Strap  - 1 x daily - 3 reps - 30 sec  hold - Gastroc Stretch with Foot at Wall  - 1 x daily - 3 reps - 30-60 sec hold - Standing Hip Abduction  - 3 x weekly - 3 sets - 10 reps - Mini Squat  - 3 x weekly - 3 sets - 10 reps - Seated Figure 4 Ankle Inversion with Resistance  - 3 x weekly - 3 sets - 15 reps - Standing Calf Raise With Small Ball at Heels  - 3 x weekly - 3 sets - 15 reps - Seated Calf Stretch with Strap  - 1 x daily - 3 reps - 30 sec  hold   ASSESSMENT:   CLINICAL IMPRESSION:  Pt demonstrates improved B hip abduction and adduction strength as well as functional LE strength with sit <> stand since initial evaluation. Some unsteadiness however when performing the aforementioned transfer. Pt demonstrates slight decrease in FOTO score compared to previous measurement however. Overall, pt making progress with PT towards goals. Pt will benefit from continued skilled physical therapy services to improve strength, balance, and function.       OBJECTIVE IMPAIRMENTS: Abnormal gait, decreased balance, decreased ROM, decreased strength, and pain.    ACTIVITY LIMITATIONS: squatting and stairs   PARTICIPATION LIMITATIONS: occupation   PERSONAL FACTORS: 3+ comorbidities: pes planus on right foot along with midfoot OA, a-fib, chronic systolic hear failure    are also affecting patient's functional outcome.    REHAB POTENTIAL: Good   CLINICAL DECISION MAKING: Stable/uncomplicated   EVALUATION COMPLEXITY: Low     GOALS: Goals reviewed with patient? No   SHORT TERM GOALS: Target date: 08/13/2022  Pt will be independent with HEP in order to improve strength and balance in order to decrease fall risk and improve function at home and work. Baseline: Completing independently  Goal status: Ongoing    Pt will decrease 5TSTS by at least 3 seconds in order to demonstrate clinically significant improvement in LE strength. Baseline: 25 sec   09/11/22: 26 sec  Goal status: Ongoing      LONG TERM GOALS: Target date:  11/26/2022    Patient will have improved function and activity level as evidenced by an increase in FOTO score  by 10 points or more.  Baseline: 55/100 with target of 59 09/11/22: 57/100; 10/15/2022: 53/100   Goal status: Ongoing    2.  Patient will improve hip strength by 1/3 MMT grade for improve LE function in order to more easily and safely negotiate stairs.  Baseline: Hip Abd R/L 4-/4-, Hip Add R/L 4-/4-, Hip Ext R/L 4-/4-  09/11/22: Hip Abd R/L 4-/4, Hip Add R/L 4-/3+, Hip Ext R/L 3+/4; 10/15/2022: Hip Abd R/L 4/4+, Hip Add R/L 4-/4, Hip Ext R/L 3+/3+    Goal status: Ongoing    3.  Patient will improve 5 x STS to <12 sec to demonstrate LE strength that places him at a decreased risk for falls. Baseline: 25 sec 09/11/22: 26 sec ; 19 seconds with B UE assist.  Goal status: Ongoing    4.  Patient will perform >=10 sit to stand reps as evidence of improved LE endurance and ability to negative stairs more easily and more safely.  Baseline: 6 reps 09/24/22:8 reps; 30 second sit <> stand: 10 times, some unsteadiness, definite need of UE assist (10/15/2022)    Goal status: Partially met    5.  Patient will negotiate stairs without use of railings and with step over step as evidence of improve stability while negotiating stairs.  Baseline: Uses bilateral railings with step over step  09/24/22: Step to without use of railings; Able to ascend 4 regular steps step over step without UE assist. Step to pattern when descending stairs, no UE assist. Pt however returns to step to pattern with ascending and descending stairs during 2nd repetition.  (10/15/2022)   Goal status: Ongoing         PLAN:   PT FREQUENCY: 1-2x/week   PT DURATION: 6 weeks   PLANNED INTERVENTIONS: Therapeutic exercises, Neuromuscular re-education, Balance training, Gait training, Joint mobilization, Joint manipulation, Stair training,  Orthotic/Fit training, DME instructions, Aquatic Therapy, Dry Needling, Electrical stimulation, Cryotherapy, Moist heat, Manual therapy, and Re-evaluation   PLAN FOR NEXT SESSION:   Progress hip strengthening exercises with functional context of stairs: step ups and side step ups.   Ellin Goodie PT, DPT   Physical Therapist- [facility]  10/15/2022, 12:57 PM

## 2022-10-17 ENCOUNTER — Encounter: Payer: BC Managed Care – PPO | Admitting: Physical Therapy

## 2022-10-18 DIAGNOSIS — I4811 Longstanding persistent atrial fibrillation: Secondary | ICD-10-CM | POA: Diagnosis not present

## 2022-10-19 ENCOUNTER — Encounter (INDEPENDENT_AMBULATORY_CARE_PROVIDER_SITE_OTHER): Payer: Self-pay | Admitting: Vascular Surgery

## 2022-10-23 ENCOUNTER — Ambulatory Visit: Payer: BC Managed Care – PPO | Attending: Nurse Practitioner | Admitting: Physical Therapy

## 2022-10-23 ENCOUNTER — Encounter: Payer: Self-pay | Admitting: Physical Therapy

## 2022-10-23 DIAGNOSIS — M25571 Pain in right ankle and joints of right foot: Secondary | ICD-10-CM | POA: Diagnosis not present

## 2022-10-23 DIAGNOSIS — R262 Difficulty in walking, not elsewhere classified: Secondary | ICD-10-CM

## 2022-10-23 NOTE — Therapy (Signed)
OUTPATIENT PHYSICAL THERAPY DISCHARGE NOTE   Patient Name: Curtis Hodges MRN: [id number] DOB:March 12, 1971, 52 y.o., male Today's Date: 10/23/2022  PCP: Della Goo FNP  REFERRING PROVIDER: Della Goo FNP   END OF SESSION:   PT End of Session - 10/23/22 1146     Visit Number 8    Number of Visits 20    Date for PT Re-Evaluation 11/29/22    Authorization Type [employer]    Authorization - Visit Number 8    Authorization - Number of Visits 20    Progress Note Due on Visit 10    PT Start Time 1145    PT Stop Time 1230    PT Time Calculation (min) 45 min    Equipment Utilized During Treatment Gait belt    Activity Tolerance Patient tolerated treatment well    Behavior During Therapy WFL for tasks assessed/performed              Past Medical History:  Diagnosis Date   Atrial fibrillation (HCC)    Hypertension    Thyroid disease    Past Surgical History:  Procedure Laterality Date   CHOLECYSTECTOMY     GALLBLADDER SURGERY     Patient Active Problem List   Diagnosis Date Noted   Chronic venous insufficiency 09/29/2022   Lymphedema 09/29/2022   Cellulitis of foot, left 08/11/2021   Bilateral edema of lower extremity 05/04/2021   Adrenal nodule (HCC) 05/04/2021   Onychodystrophy 05/04/2021   Longstanding persistent atrial fibrillation (HCC) 04/25/2021   Medication management 02/14/2021   Chronic systolic heart failure (HCC) 02/03/2021   Primary hypertension 02/03/2021   Bradycardia, unspecified 01/20/2021   Hypotension 01/09/2021   Single subsegmental pulmonary embolism without acute cor pulmonale (HCC) 01/09/2021   Swelling of toe of right foot 01/09/2021   NAFL (nonalcoholic fatty liver) 12/27/2020   Bipolar disorder, unspecified (HCC) 09/17/2020   Hypothyroidism, unspecified 09/17/2020   Unspecified atrial fibrillation (HCC) 09/17/2020   Personal history of pulmonary embolism 09/17/2020   Eosinophilia, unspecified 09/17/2020   Anemia, unspecified 09/17/2020    Cardiomyopathy, unspecified (HCC) 09/17/2020   Diastolic heart failure (HCC) 09/17/2020   Hypo-osmolality and hyponatremia 09/17/2020   Long term (current) use of anticoagulants 09/17/2020    REFERRING DIAG: Balance problem, trouble with stairs  THERAPY DIAG:  Difficulty in walking, not elsewhere classified  Pain in right ankle and joints of right foot  Rationale for Evaluation and Treatment Rehabilitation  PERTINENT HISTORY: Pt describes that he is feeling unsteady when going upstairs. He does not regularly exercise and he would like to be shown exercises to strengthen his legs and improve his balance. He describes pain in his right foot along with swelling that has worsened over the past couple of weeks. He thinks that the swelling is caused by anxiety and he is getting it examined by a vascular specialist to rule out vascular pathology.   PRECAUTIONS: None   SUBJECTIVE:  SUBJECTIVE STATEMENT: Pt continues to report pain and discomfort with right ankle. He recently saw vascular physician who said he has lymphedema in right ankle. He has setup an appointment with his podiatrist to get orthotics to help with pes planus on right foot.    Are you having pain? No   OBJECTIVE: (objective measures completed at initial evaluation unless otherwise dated)  VITALS: BP 115/65 HR 91 SpO2 99   DIAGNOSTIC FINDINGS: No imaging report available but per podiatry note in January Dr. Posey Pronto reports that pt's right foot swelling is due to midfoot arthritis:     3 views of skeletally mature adult bilateral foot: Osteoarthritic changes noted to bilateral's first metatarsophalangeal joint as well as midfoot.  Patient has pes planovalgus foot structure.        PATIENT SURVEYS:  FOTO 55/100 with target of 46     COGNITION: Overall cognitive status: Within functional limits for tasks assessed                         SENSATION: WFL     MUSCLE LENGTH: Hamstrings:60 deg with restriction  Marcello Moores test: + Bilateral    POSTURE: rounded shoulders   EDEMA: Figure 8 Ankle R/L 70 cm/ 65 cm    PALPATION: No areas TTP    LOWER EXTREMITY ROM:         Active/Passive Right 07/30/2022 Left 07/30/2022  Hip flexion  120 120   Hip extension  30  30  Hip abduction  45 45   Hip adduction  30 30   Hip internal rotation      Hip external rotation      Knee flexion      Knee extension 0 0  Ankle dorsiflexion 10 10  Ankle plantarflexion 50/50 50/50  Ankle inversion 30/30 35/35  Ankle eversion 10/10 15/15   (Blank rows = not tested)        LOWER EXTREMITY MMT:  Not performed    MMT Right eval Left eval  Hip flexion  4+  4+  Hip extension  4-  4-  Hip abduction 4-  4-   Hip adduction      Hip internal rotation      Hip external rotation      Knee flexion  5 5   Knee extension  5 5   Ankle dorsiflexion  5 5   Ankle plantarflexion      Ankle inversion      Ankle eversion       (Blank rows = not tested)   LOWER EXTREMITY SPECIAL TESTS:  Not performed    FUNCTIONAL TESTS:  5 times sit to stand: 25 sec  30 seconds chair stand test: 6 reps  Dynamic Gait Index: 22/24 -Mild impairment with stairs and turning to stop          GAIT: Distance walked: 50 ft  Assistive device utilized: None Level of assistance: Complete Independence Comments: Left lateral trunk lean due to excessive pronation of right foot      TODAY'S TREATMENT:  DATE:   10/23/22:  Nu-Step with level 4 resistance and seat and arms at 11 for 5 min  5 xSTS: 15 sec  30 sec chair stands: 5 reps  Stairs: Step over step with use of railing   Reviewed HEP including: Standing hip abduction with  1 UE support 3 x 10  -mod VC to maintain upright posture Standing heel raises with inversion bias including tennis ball and 1 UE support 1 x 10  Seated Calf Stretch with foot propped on chair 2 x 30 sec  -mod VC to pull back as far as possible on strap for increased stretch   FOTO: 56/100 with target of 59   10/15/22: Sit <> stand from regular chair 5x (sitting down completely each time) 19 seconds with B UE assist. Unsteady. Feels weak in R LE   Sit <> stand 30 seconds (able to perform 10x with B UE assist)   Ascending and descending 4 regular steps 2x  Manually resisted S/L hip abduction, hip adduction, prone hip extension  4 inch step up with 1 UE support  2 x 10 R and L LE each (able to perform second set without UE assist but with PT CGA   6 inch step up with no UE support x 10 R and L LE each   Reviewed POC: continue another 2x/week for 6 weeks.     Improved exercise technique, movement at target joints, use of target muscles after mod verbal, visual, tactile cues.   09/24/22: Matrix Recumbent Bicycle 5 min with level 18 for seat  30 sec chair stand: 8 reps  Seated Figure 4 Ankle Inversion on Left with yellow band 3 x 15  Standing Heel Raises with ball grasp at heels 3 x 10  -mod VC and TC to position feet and ball so pt could perform correctly   09/11/22:  FOTO: 57/100 with target of 59  5 X STS: 26 sec  MMT: Hip Abd R/L 4-/4, Hip Add R/L 4-/3+, Hip Ext R/L 3+/4  Sit to Stand from 18 inch chair height 1 x 10  4 inch runner step up with 1 UE support  2 x 10  6 inch runner step up with 1 UE support 2 x 10   Mini-Squats with water jug 3 x 10 -mod VC to decrease speed of eccentric phase and to remain upright with trunk  -Provided mirror for visual input      PATIENT EDUCATION:  Education details: form and technique for appropriate exercise, signs/symptoms of infection, foot postioning and limb elevation to improve R foot edema.  Person educated:  Patient Education method: Explanation, Demonstration, Verbal cues, and Handouts Education comprehension: verbalized understanding, returned demonstration, and verbal cues required   HOME EXERCISE PROGRAM: Access Code: [email or web address] URL: [email or web address] Date: 10/23/2022 Prepared by: Bradly Chris  Exercises - Sit to Stand  - 3 x weekly - 3 sets - 10 reps - Supine Dynamic Modified Karl Pock and Hip Flexor Dynamic Stretch  - 1 x daily - 3 reps - 30 sec  hold - Prone Quadriceps Stretch with Strap  - 1 x daily - 3 reps - 30 sec  hold - Gastroc Stretch with Foot at Wall  - 1 x daily - 3 reps - 30-60 sec hold - Standing Hip Abduction  - 3 x weekly - 3 sets - 10 reps - Standing Calf Raise With Small Ball at Heels  - 3 x weekly - 3 sets - 15 reps - Long Sitting Calf  Stretch with Strap  - 1 x daily - 3 reps - 30 sec  hold   ASSESSMENT:   CLINICAL IMPRESSION:  Pt continues to demonstrate LE weakness that has not improved despite weeks of physical therapy and his right ankle pain has not decreased. He does show an improvement in his mobility with ability to negotiate stairs with step over step and increased steadiness. Pt does continue to show decreased confidence with balance as evidenced by little change in FOTO score. The right ankle pain appears to be related to swelling from lymphedema and to ankle arthritis, which he is being managed by medical team with both vascular and his podiatrist. At this point, pt has reached his rehab potential despite not reaching his rehab goals. Focus of session on reviewing home exercise plan so that patient feels that he can perform independently. Pt was able to demonstrate consistent and accurate performance of exercise by end with mod VC to sequence exercises.   OBJECTIVE IMPAIRMENTS: Abnormal gait, decreased balance, decreased ROM, decreased strength, and pain.    ACTIVITY LIMITATIONS: squatting and stairs   PARTICIPATION LIMITATIONS:  occupation   PERSONAL FACTORS: 3+ comorbidities: pes planus on right foot along with midfoot OA, a-fib, chronic systolic hear failure    are also affecting patient's functional outcome.    REHAB POTENTIAL: Good   CLINICAL DECISION MAKING: Stable/uncomplicated   EVALUATION COMPLEXITY: Low     GOALS: Goals reviewed with patient? No   SHORT TERM GOALS: Target date: 08/13/2022  Pt will be independent with HEP in order to improve strength and balance in order to decrease fall risk and improve function at home and work. Baseline: Completing independently  Goal status: Ongoing    Pt will decrease 5TSTS by at least 3 seconds in order to demonstrate clinically significant improvement in LE strength. Baseline: 25 sec  09/11/22: 26 sec 10/23/22: 15 sec  Goal status: Achieved      LONG TERM GOALS: Target date:  12/04/2022    Patient will have improved function and activity level as evidenced by an increase in FOTO score by 10 points or more.  Baseline: 55/100 with target of 59 09/11/22: 57/100; 10/15/2022: 53/100 10/23/22: 56/100   Goal status: Ongoing    2.  Patient will improve hip strength by 1/3 MMT grade for improve LE function in order to more easily and safely negotiate stairs.  Baseline: Hip Abd R/L 4-/4-, Hip Add R/L 4-/4-, Hip Ext R/L 4-/4-  09/11/22: Hip Abd R/L 4-/4, Hip Add R/L 4-/3+, Hip Ext R/L 3+/4; 10/15/2022: Hip Abd R/L 4/4+, Hip Add R/L 4-/4, Hip Ext R/L 3+/3+  Goal status: Deferred    3.  Patient will improve 5 x STS to <12 sec to demonstrate LE strength that places him at a decreased risk for falls. Baseline: 25 sec 09/11/22: 26 sec ; 19 seconds with B UE assist. 10/23/22:15 SEC  Goal status: Not Met    4.  Patient will perform >=10 sit to stand reps as evidence of improved LE endurance and ability to negative stairs more easily and more safely.  Baseline: 6 reps 09/24/22:8 reps; 30 second sit <> stand: 10 times, some unsteadiness, definite need of UE assist (10/15/2022)   10/23/22: 5 reps   Goal status: Partially met    5.  Patient will negotiate stairs without use of railings and with step over step as evidence of improve stability while negotiating stairs.  Baseline: Uses bilateral railings with step over step  09/24/22: Step to  without use of railings; Able to ascend 4 regular steps step over step without UE assist. Step to pattern when descending stairs, no UE assist. Pt however returns to step to pattern with ascending and descending stairs during 2nd repetition.  (10/15/2022) 10/23/22: Step over step pattern without use of rails    Goal status: Achieved          PLAN:   PT FREQUENCY: 1-2x/week   PT DURATION: 6 weeks   PLANNED INTERVENTIONS: Therapeutic exercises, Neuromuscular re-education, Balance training, Gait training, Joint mobilization, Joint manipulation, Stair training, Orthotic/Fit training, DME instructions, Aquatic Therapy, Dry Needling, Electrical stimulation, Cryotherapy, Moist heat, Manual therapy, and Re-evaluation   PLAN FOR NEXT SESSION:   Discharge from [place] [facility], DPT   Physical Therapist- [facility]  10/23/2022, 11:48 AM

## 2022-10-30 ENCOUNTER — Ambulatory Visit: Payer: BC Managed Care – PPO | Admitting: Physical Therapy

## 2022-11-05 ENCOUNTER — Encounter: Payer: BC Managed Care – PPO | Admitting: Physical Therapy

## 2022-11-08 ENCOUNTER — Encounter: Payer: BC Managed Care – PPO | Admitting: Physical Therapy

## 2022-11-12 ENCOUNTER — Encounter: Payer: Self-pay | Admitting: Nurse Practitioner

## 2022-11-12 ENCOUNTER — Encounter: Payer: BC Managed Care – PPO | Admitting: Physical Therapy

## 2022-11-12 ENCOUNTER — Other Ambulatory Visit: Payer: Self-pay | Admitting: Nurse Practitioner

## 2022-11-12 MED ORDER — MUPIROCIN 2 % EX OINT: 1.0000 | TOPICAL_OINTMENT | Freq: Two times a day (BID) | CUTANEOUS | 0 refills | Status: DC

## 2022-11-13 ENCOUNTER — Encounter: Payer: BC Managed Care – PPO | Admitting: Physical Therapy

## 2022-11-14 ENCOUNTER — Encounter: Payer: Self-pay | Admitting: *Deleted

## 2022-11-14 ENCOUNTER — Other Ambulatory Visit: Payer: Self-pay

## 2022-11-14 ENCOUNTER — Emergency Department (EMERGENCY_DEPARTMENT_HOSPITAL)
Admission: EM | Admit: 2022-11-14 | Discharge: 2022-11-15 | Disposition: A | Discharge status: Other Acute Inpatient Hospital | Payer: BC Managed Care – PPO | Source: Home / Self Care | Attending: Emergency Medicine | Admitting: Emergency Medicine

## 2022-11-14 DIAGNOSIS — F3481 Disruptive mood dysregulation disorder: Secondary | ICD-10-CM | POA: Insufficient documentation

## 2022-11-14 DIAGNOSIS — E039 Hypothyroidism, unspecified: Secondary | ICD-10-CM | POA: Insufficient documentation

## 2022-11-14 DIAGNOSIS — R600 Localized edema: Secondary | ICD-10-CM | POA: Diagnosis present

## 2022-11-14 DIAGNOSIS — I110 Hypertensive heart disease with heart failure: Secondary | ICD-10-CM | POA: Insufficient documentation

## 2022-11-14 DIAGNOSIS — Z79899 Other long term (current) drug therapy: Secondary | ICD-10-CM | POA: Insufficient documentation

## 2022-11-14 DIAGNOSIS — I5030 Unspecified diastolic (congestive) heart failure: Secondary | ICD-10-CM | POA: Insufficient documentation

## 2022-11-14 DIAGNOSIS — Z86711 Personal history of pulmonary embolism: Secondary | ICD-10-CM | POA: Diagnosis not present

## 2022-11-14 DIAGNOSIS — R001 Bradycardia, unspecified: Secondary | ICD-10-CM | POA: Diagnosis present

## 2022-11-14 DIAGNOSIS — I4891 Unspecified atrial fibrillation: Secondary | ICD-10-CM | POA: Insufficient documentation

## 2022-11-14 DIAGNOSIS — I5042 Chronic combined systolic (congestive) and diastolic (congestive) heart failure: Secondary | ICD-10-CM | POA: Diagnosis not present

## 2022-11-14 DIAGNOSIS — Z7901 Long term (current) use of anticoagulants: Secondary | ICD-10-CM | POA: Insufficient documentation

## 2022-11-14 DIAGNOSIS — G4700 Insomnia, unspecified: Secondary | ICD-10-CM | POA: Diagnosis not present

## 2022-11-14 DIAGNOSIS — I959 Hypotension, unspecified: Secondary | ICD-10-CM | POA: Diagnosis present

## 2022-11-14 DIAGNOSIS — F319 Bipolar disorder, unspecified: Secondary | ICD-10-CM | POA: Insufficient documentation

## 2022-11-14 DIAGNOSIS — Z79890 Hormone replacement therapy (postmenopausal): Secondary | ICD-10-CM | POA: Diagnosis not present

## 2022-11-14 DIAGNOSIS — F309 Manic episode, unspecified: Secondary | ICD-10-CM

## 2022-11-14 DIAGNOSIS — Z1152 Encounter for screening for COVID-19: Secondary | ICD-10-CM | POA: Insufficient documentation

## 2022-11-14 DIAGNOSIS — D649 Anemia, unspecified: Secondary | ICD-10-CM | POA: Insufficient documentation

## 2022-11-14 DIAGNOSIS — F3113 Bipolar disorder, current episode manic without psychotic features, severe: Secondary | ICD-10-CM | POA: Diagnosis not present

## 2022-11-14 DIAGNOSIS — F250 Schizoaffective disorder, bipolar type: Secondary | ICD-10-CM | POA: Diagnosis not present

## 2022-11-14 DIAGNOSIS — Z634 Disappearance and death of family member: Secondary | ICD-10-CM | POA: Diagnosis not present

## 2022-11-14 DIAGNOSIS — Z20822 Contact with and (suspected) exposure to covid-19: Secondary | ICD-10-CM | POA: Diagnosis not present

## 2022-11-14 DIAGNOSIS — I10 Essential (primary) hypertension: Secondary | ICD-10-CM | POA: Diagnosis present

## 2022-11-14 LAB — URINE DRUG SCREEN, QUALITATIVE (ARMC ONLY)
Amphetamines, Ur Screen: NOT DETECTED
Barbiturates, Ur Screen: NOT DETECTED
Benzodiazepine, Ur Scrn: NOT DETECTED
Cannabinoid 50 Ng, Ur ~~LOC~~: NOT DETECTED
Cocaine Metabolite,Ur ~~LOC~~: NOT DETECTED
MDMA (Ecstasy)Ur Screen: NOT DETECTED
Methadone Scn, Ur: NOT DETECTED
Opiate, Ur Screen: NOT DETECTED
Phencyclidine (PCP) Ur S: NOT DETECTED
Tricyclic, Ur Screen: NOT DETECTED

## 2022-11-14 LAB — COMPREHENSIVE METABOLIC PANEL
ALT: 22 U/L (ref 0–44)
AST: 26 U/L (ref 15–41)
Albumin: 4.4 g/dL (ref 3.5–5.0)
Alkaline Phosphatase: 114 U/L (ref 38–126)
Anion gap: 8 (ref 5–15)
BUN: 20 mg/dL (ref 6–20)
CO2: 24 mmol/L (ref 22–32)
Calcium: 9.7 mg/dL (ref 8.9–10.3)
Chloride: 104 mmol/L (ref 98–111)
Creatinine, Ser: 0.92 mg/dL (ref 0.61–1.24)
GFR, Estimated: >60 mL/min (ref >60)
Glucose, Bld: 124 mg/dL — ABNORMAL HIGH (ref 70–99)
Potassium: 4.2 mmol/L (ref 3.5–5.1)
Sodium: 136 mmol/L (ref 135–145)
Total Bilirubin: 0.5 mg/dL (ref 0.3–1.2)
Total Protein: 8 g/dL (ref 6.5–8.1)

## 2022-11-14 LAB — SALICYLATE LEVEL: Salicylate Lvl: <7 mg/dL — ABNORMAL LOW (ref 7.0–30.0)

## 2022-11-14 LAB — CBC
HCT: 42.5 % (ref 39.0–52.0)
Hemoglobin: 14.1 g/dL (ref 13.0–17.0)
MCH: 32.1 pg (ref 26.0–34.0)
MCHC: 33.2 g/dL (ref 30.0–36.0)
MCV: 96.8 fL (ref 80.0–100.0)
Platelets: 321 10*3/uL (ref 150–400)
RBC: 4.39 MIL/uL (ref 4.22–5.81)
RDW: 13.2 % (ref 11.5–15.5)
WBC: 8 10*3/uL (ref 4.0–10.5)
nRBC: 0 % (ref 0.0–0.2)

## 2022-11-14 LAB — ACETAMINOPHEN LEVEL: Acetaminophen (Tylenol), Serum: <10 ug/mL — ABNORMAL LOW (ref 10–30)

## 2022-11-14 LAB — ETHANOL: Alcohol, Ethyl (B): <10 mg/dL (ref <10)

## 2022-11-14 MED ORDER — MIRTAZAPINE 15 MG PO TABS: 30.0000 mg | ORAL_TABLET | Freq: Every day | ORAL | Status: DC

## 2022-11-14 MED ORDER — METOPROLOL SUCCINATE ER 50 MG PO TB24: 50.0000 mg | ORAL_TABLET | Freq: Every day | ORAL | Status: DC

## 2022-11-14 MED ORDER — DIGOXIN 250 MCG PO TABS: 250.0000 ug | ORAL_TABLET | Freq: Every day | ORAL | Status: DC

## 2022-11-14 MED ORDER — BUSPIRONE HCL 5 MG PO TABS: 30.0000 mg | ORAL_TABLET | Freq: Two times a day (BID) | ORAL | Status: DC

## 2022-11-14 MED ORDER — RIVAROXABAN 20 MG PO TABS: 20.0000 mg | ORAL_TABLET | Freq: Every morning | ORAL | Status: DC

## 2022-11-14 MED ORDER — RISPERIDONE 0.5 MG PO TBDP: 0.5000 mg | ORAL_TABLET | Freq: Every day | ORAL | Status: DC

## 2022-11-14 MED ORDER — RAMELTEON 8 MG PO TABS: 8.0000 mg | ORAL_TABLET | Freq: Every day | ORAL | Status: DC

## 2022-11-14 MED ORDER — MIDODRINE HCL 5 MG PO TABS: 5.0000 mg | ORAL_TABLET | Freq: Two times a day (BID) | ORAL | Status: DC

## 2022-11-14 MED ORDER — OXCARBAZEPINE 300 MG PO TABS: 600.0000 mg | ORAL_TABLET | Freq: Two times a day (BID) | ORAL | Status: DC

## 2022-11-14 MED ORDER — LEVOTHYROXINE SODIUM 50 MCG PO TABS: 75.0000 ug | ORAL_TABLET | Freq: Every day | ORAL | Status: DC

## 2022-11-14 NOTE — ED Notes (Signed)
Pt given milk

## 2022-11-14 NOTE — ED Provider Notes (Signed)
[facility] Provider Note    Event Date/Time   First MD Initiated Contact with Patient 11/14/22 2010     (approximate)   History   Chief Complaint Psychiatric Evaluation   HPI  Curtis Hodges is a 52 y.o. male with past medical history of bipolar disorder, atrial fibrillation on Xarelto, hypothyroidism, and diastolic CHF who presents to the ED for psychiatric evaluation.  Per patient's sister at bedside, he has been acting increasingly erratically over the past couple of days.  He has been dealing with the death of his father, has been having increased angry outburst per sister.  He has been taking his medications as prescribed, continues to follow with psychiatry, who recommended he came to the ED today for further evaluation.  Patient reportedly send multiple incoherent text messages to his psychiatrist, who then got in contact with patient's family.  Patient denies any suicidal or homicidal ideation, does admit that he has not slept much in a couple of weeks.     Physical Exam   Triage Vital Signs: ED Triage Vitals  Enc Vitals Group     BP 11/14/22 1618 (!) 138/98     Pulse Rate 11/14/22 1618 84     Resp 11/14/22 1618 18     Temp 11/14/22 1618 98.3 F (36.8 C)     Temp Source 11/14/22 1618 Oral     SpO2 11/14/22 1618 100 %     Weight 11/14/22 1619 225 lb (102.1 kg)     Height 11/14/22 1619 '6\' 5"'$  (1.956 m)     Head Circumference --      Peak Flow --      Pain Score 11/14/22 1618 0     Pain Loc --      Pain Edu? --      Excl. in [place]? --     Most recent vital signs: Vitals:   11/14/22 1618  BP: (!) 138/98  Pulse: 84  Resp: 18  Temp: 98.3 F (36.8 C)  SpO2: 100%    Constitutional: Alert and oriented. Eyes: Conjunctivae are normal. Head: Atraumatic. Nose: No congestion/rhinnorhea. Mouth/Throat: Mucous membranes are moist.  Cardiovascular: Normal rate, regular rhythm. Grossly normal heart sounds.  2+ radial pulses  bilaterally. Respiratory: Normal respiratory effort.  No retractions. Lungs CTAB. Gastrointestinal: Soft and nontender. No distention. Musculoskeletal: No lower extremity tenderness nor edema.  Neurologic:  Normal speech and language. No gross focal neurologic deficits are appreciated.    ED Results / Procedures / Treatments   Labs (all labs ordered are listed, but only abnormal results are displayed) Labs Reviewed  COMPREHENSIVE METABOLIC PANEL - Abnormal; Notable for the following components:      Result Value   Glucose, Bld 124 (*)    All other components within normal limits  SALICYLATE LEVEL - Abnormal; Notable for the following components:   Salicylate Lvl [id number] (*)    All other components within normal limits  ACETAMINOPHEN LEVEL - Abnormal; Notable for the following components:   Acetaminophen (Tylenol), Serum <10 (*)    All other components within normal limits  ETHANOL  CBC  URINE DRUG SCREEN, QUALITATIVE ([facility] ONLY)    PROCEDURES:  Critical Care performed: No  Procedures   MEDICATIONS ORDERED IN ED: Medications  busPIRone (BUSPAR) tablet 30 mg (30 mg Oral Not Given 11/14/22 2250)  digoxin (LANOXIN) tablet 250 mcg (has no administration in time range)  levothyroxine (SYNTHROID) tablet 75 mcg (has no administration in time range)  metoprolol succinate (  TOPROL-XL) 24 hr tablet 50 mg (50 mg Oral Not Given 11/14/22 2250)  midodrine (PROAMATINE) tablet 5 mg (has no administration in time range)  mirtazapine (REMERON) tablet 30 mg (30 mg Oral Not Given 11/14/22 2250)  Oxcarbazepine (TRILEPTAL) tablet 600 mg (600 mg Oral Not Given 11/14/22 2250)  ramelteon (ROZEREM) tablet 8 mg (8 mg Oral Not Given 11/14/22 2251)  risperiDONE (RISPERDAL M-TABS) disintegrating tablet 0.5 mg (has no administration in time range)  rivaroxaban (XARELTO) tablet 20 mg (has no administration in time range)     IMPRESSION / MDM / ASSESSMENT AND PLAN / ED COURSE  I reviewed the triage vital  signs and the nursing notes.                              52 y.o. male with past medical history of bipolar disorder, atrial fibrillation on Xarelto, hypothyroidism, and diastolic CHF who presents to the ED for psychiatric evaluation due to increasing erratic behavior with insomnia and angry outburst.  Patient's presentation is most consistent with acute presentation with potential threat to life or bodily function.  Differential diagnosis includes, but is not limited to, mania, psychosis, depression, anxiety, medication noncompliance.  Patient nontoxic-appearing and in no acute distress, vital signs are unremarkable.  He denies any medical complaints and screening labs are unremarkable with no significant anemia, leukocytosis, electrolyte abnormality, or AKI.  Tylenol and salicylate levels are undetectable, patient may be medically cleared for psychiatric disposition.  He has been evaluated by psychiatry and they are recommending inpatient admission.  The patient has been placed in psychiatric observation due to the need to provide a safe environment for the patient while obtaining psychiatric consultation and evaluation, as well as ongoing medical and medication management to treat the patient's condition.  The patient has not been placed under full IVC at this time.      FINAL CLINICAL IMPRESSION(S) / ED DIAGNOSES   Final diagnoses:  Mania ([place])     Rx / DC Orders   ED Discharge Orders     None        Note:  This document was prepared using Dragon voice recognition software and may include unintentional dictation errors.   Blake Divine, MD 11/14/22 2252

## 2022-11-14 NOTE — ED Notes (Signed)
Pt has sores on feet and lower legs.

## 2022-11-14 NOTE — ED Triage Notes (Addendum)
Pt brought in by sister.Pt is VOL.  Sister reports recent death in family, anger issues, insomnia.     Pt reports taking meds but adjustments have been made.  Pt denies drugs or etoh use.  Pt denies SI or HI   Pt cooperative.

## 2022-11-14 NOTE — ED Notes (Signed)
Black sneakers White socks [employer] White underwear [place] WILL TAKE BELONGINGS WITH HER

## 2022-11-14 NOTE — ED Notes (Addendum)
Pts sister left at this time, pt up to use restroom and returned to bed

## 2022-11-14 NOTE — ED Notes (Signed)
Pt asleep in bed, sister at bedside

## 2022-11-14 NOTE — ED Notes (Signed)
Pt given blanket.

## 2022-11-14 NOTE — ED Notes (Addendum)
Pt agitated at this time over wait in lobby, pt states that all he needs is 12 hours of sleep and a billing plan to pay for his ER visit today. Pt denies SI/HI. Sister at bedside

## 2022-11-15 ENCOUNTER — Encounter: Payer: BC Managed Care – PPO | Admitting: Physical Therapy

## 2022-11-15 ENCOUNTER — Inpatient Hospital Stay
Admission: AD | Admit: 2022-11-15 | Discharge: 2022-12-03 | DRG: 885 | Disposition: A | Discharge status: Home / Self Care | Payer: BC Managed Care – PPO | Source: Intra-hospital | Attending: Psychiatry | Admitting: Psychiatry

## 2022-11-15 DIAGNOSIS — F3113 Bipolar disorder, current episode manic without psychotic features, severe: Secondary | ICD-10-CM | POA: Diagnosis not present

## 2022-11-15 DIAGNOSIS — F319 Bipolar disorder, unspecified: Secondary | ICD-10-CM | POA: Diagnosis present

## 2022-11-15 DIAGNOSIS — I4891 Unspecified atrial fibrillation: Secondary | ICD-10-CM | POA: Diagnosis present

## 2022-11-15 DIAGNOSIS — I5042 Chronic combined systolic (congestive) and diastolic (congestive) heart failure: Secondary | ICD-10-CM | POA: Diagnosis present

## 2022-11-15 DIAGNOSIS — D649 Anemia, unspecified: Secondary | ICD-10-CM | POA: Diagnosis present

## 2022-11-15 DIAGNOSIS — Z79899 Other long term (current) drug therapy: Secondary | ICD-10-CM

## 2022-11-15 DIAGNOSIS — G4700 Insomnia, unspecified: Secondary | ICD-10-CM | POA: Diagnosis present

## 2022-11-15 DIAGNOSIS — F3481 Disruptive mood dysregulation disorder: Secondary | ICD-10-CM | POA: Diagnosis present

## 2022-11-15 DIAGNOSIS — Z20822 Contact with and (suspected) exposure to covid-19: Secondary | ICD-10-CM | POA: Diagnosis present

## 2022-11-15 DIAGNOSIS — Z634 Disappearance and death of family member: Secondary | ICD-10-CM | POA: Diagnosis not present

## 2022-11-15 DIAGNOSIS — Z86711 Personal history of pulmonary embolism: Secondary | ICD-10-CM | POA: Diagnosis present

## 2022-11-15 DIAGNOSIS — E039 Hypothyroidism, unspecified: Secondary | ICD-10-CM | POA: Diagnosis present

## 2022-11-15 DIAGNOSIS — Z79890 Hormone replacement therapy (postmenopausal): Secondary | ICD-10-CM

## 2022-11-15 DIAGNOSIS — F250 Schizoaffective disorder, bipolar type: Secondary | ICD-10-CM | POA: Diagnosis not present

## 2022-11-15 DIAGNOSIS — I5022 Chronic systolic (congestive) heart failure: Secondary | ICD-10-CM | POA: Diagnosis present

## 2022-11-15 DIAGNOSIS — I110 Hypertensive heart disease with heart failure: Secondary | ICD-10-CM | POA: Diagnosis present

## 2022-11-15 DIAGNOSIS — Z7901 Long term (current) use of anticoagulants: Secondary | ICD-10-CM | POA: Diagnosis not present

## 2022-11-15 DIAGNOSIS — R600 Localized edema: Secondary | ICD-10-CM | POA: Diagnosis present

## 2022-11-15 LAB — RESP PANEL BY RT-PCR (RSV, FLU A&B, COVID)  RVPGX2
Influenza A by PCR: NEGATIVE
Influenza B by PCR: NEGATIVE
Resp Syncytial Virus by PCR: NEGATIVE
SARS Coronavirus 2 by RT PCR: NEGATIVE

## 2022-11-15 MED ORDER — RISPERIDONE 1 MG PO TBDP
0.5000 mg | ORAL_TABLET | Freq: Every day | ORAL | Status: DC
  Filled 2022-11-15: qty 1

## 2022-11-15 MED ORDER — RISPERIDONE 1 MG PO TBDP
1.5000 mg | ORAL_TABLET | Freq: Every day | ORAL | Status: DC
  Administered 2022-11-16 – 2022-11-20 (×5): 1.5 mg via ORAL
  Filled 2022-11-15 (×6): qty 2

## 2022-11-15 MED ORDER — LORAZEPAM 1 MG PO TABS
1.0000 mg | ORAL_TABLET | ORAL | Status: DC | PRN
  Filled 2022-11-15: qty 1

## 2022-11-15 MED ORDER — ZIPRASIDONE MESYLATE 20 MG IM SOLR
20.0000 mg | INTRAMUSCULAR | Status: AC | PRN
  Administered 2022-11-15: 20 mg via INTRAMUSCULAR
  Filled 2022-11-15: qty 20

## 2022-11-15 MED ORDER — RISPERIDONE 1 MG PO TBDP
2.0000 mg | ORAL_TABLET | Freq: Three times a day (TID) | ORAL | Status: DC | PRN
  Administered 2022-11-15 – 2022-11-17 (×2): 2 mg via ORAL
  Filled 2022-11-15 (×3): qty 2

## 2022-11-15 MED ORDER — BUSPIRONE HCL 5 MG PO TABS
15.0000 mg | ORAL_TABLET | Freq: Two times a day (BID) | ORAL | Status: DC
  Administered 2022-11-15 – 2022-11-27 (×22): 15 mg via ORAL
  Filled 2022-11-15 (×23): qty 3

## 2022-11-15 MED ORDER — HALOPERIDOL 5 MG PO TABS
5.0000 mg | ORAL_TABLET | Freq: Four times a day (QID) | ORAL | Status: DC | PRN
  Filled 2022-11-15 (×2): qty 1

## 2022-11-15 MED ORDER — DIGOXIN 250 MCG PO TABS
250.0000 ug | ORAL_TABLET | Freq: Every day | ORAL | Status: DC
  Administered 2022-11-15 – 2022-11-29 (×11): 250 ug via ORAL
  Filled 2022-11-15 (×15): qty 1

## 2022-11-15 MED ORDER — MIRTAZAPINE 15 MG PO TABS: 30.0000 mg | ORAL_TABLET | Freq: Every day | ORAL | Status: DC

## 2022-11-15 MED ORDER — BENZTROPINE MESYLATE 1 MG PO TABS: 1.0000 mg | ORAL_TABLET | Freq: Four times a day (QID) | ORAL | Status: DC | PRN

## 2022-11-15 MED ORDER — HALOPERIDOL 5 MG PO TABS: 5.0000 mg | ORAL_TABLET | Freq: Four times a day (QID) | ORAL | Status: DC | PRN

## 2022-11-15 MED ORDER — LORAZEPAM 1 MG PO TABS
1.0000 mg | ORAL_TABLET | ORAL | Status: AC | PRN
  Administered 2022-11-22: 1 mg via ORAL

## 2022-11-15 MED ORDER — RISPERIDONE 1 MG PO TBDP
2.0000 mg | ORAL_TABLET | Freq: Three times a day (TID) | ORAL | Status: DC | PRN
  Administered 2022-11-22: 2 mg via ORAL

## 2022-11-15 MED ORDER — OXCARBAZEPINE 300 MG PO TABS
600.0000 mg | ORAL_TABLET | Freq: Two times a day (BID) | ORAL | Status: DC
  Administered 2022-11-15 – 2022-11-27 (×23): 600 mg via ORAL
  Filled 2022-11-15 (×26): qty 2

## 2022-11-15 MED ORDER — ACETAMINOPHEN 325 MG PO TABS
650.0000 mg | ORAL_TABLET | Freq: Four times a day (QID) | ORAL | Status: DC | PRN
  Administered 2022-11-21: 650 mg via ORAL
  Filled 2022-11-15: qty 2

## 2022-11-15 MED ORDER — LEVOTHYROXINE SODIUM 50 MCG PO TABS: 75.0000 ug | ORAL_TABLET | Freq: Every day | ORAL | Status: DC

## 2022-11-15 MED ORDER — LEVOTHYROXINE SODIUM 50 MCG PO TABS
75.0000 ug | ORAL_TABLET | Freq: Every day | ORAL | Status: DC
  Administered 2022-11-19 – 2022-11-22 (×4): 75 ug via ORAL
  Filled 2022-11-15 (×7): qty 2

## 2022-11-15 MED ORDER — MAGNESIUM HYDROXIDE 400 MG/5ML PO SUSP: 30.0000 mL | Freq: Every day | ORAL | Status: DC | PRN

## 2022-11-15 MED ORDER — QUETIAPINE FUMARATE 100 MG PO TABS
100.0000 mg | ORAL_TABLET | Freq: Every day | ORAL | Status: DC
  Administered 2022-11-15: 100 mg via ORAL
  Filled 2022-11-15: qty 1

## 2022-11-15 MED ORDER — RIVAROXABAN 20 MG PO TABS
20.0000 mg | ORAL_TABLET | Freq: Every morning | ORAL | Status: DC
  Administered 2022-11-15 – 2022-11-22 (×8): 20 mg via ORAL
  Filled 2022-11-15 (×8): qty 1

## 2022-11-15 MED ORDER — BUSPIRONE HCL 5 MG PO TABS
30.0000 mg | ORAL_TABLET | Freq: Two times a day (BID) | ORAL | Status: DC
  Administered 2022-11-15: 30 mg via ORAL
  Filled 2022-11-15: qty 6

## 2022-11-15 MED ORDER — MIDODRINE HCL 5 MG PO TABS
5.0000 mg | ORAL_TABLET | Freq: Two times a day (BID) | ORAL | Status: DC
  Administered 2022-11-15 – 2022-11-27 (×24): 5 mg via ORAL
  Filled 2022-11-15 (×27): qty 1

## 2022-11-15 MED ORDER — METOPROLOL SUCCINATE ER 25 MG PO TB24
50.0000 mg | ORAL_TABLET | Freq: Every day | ORAL | Status: DC
  Administered 2022-11-15 – 2022-11-20 (×6): 50 mg via ORAL
  Filled 2022-11-15 (×7): qty 2

## 2022-11-15 MED ORDER — MIRTAZAPINE 15 MG PO TABS
15.0000 mg | ORAL_TABLET | Freq: Every day | ORAL | Status: DC
  Administered 2022-11-15: 15 mg via ORAL
  Filled 2022-11-15: qty 1

## 2022-11-15 MED ORDER — ALUM & MAG HYDROXIDE-SIMETH 200-200-20 MG/5ML PO SUSP: 30.0000 mL | ORAL | Status: DC | PRN

## 2022-11-15 MED ORDER — RAMELTEON 8 MG PO TABS
8.0000 mg | ORAL_TABLET | Freq: Every day | ORAL | Status: DC
  Administered 2022-11-15 – 2022-11-20 (×6): 8 mg via ORAL
  Filled 2022-11-15 (×7): qty 1

## 2022-11-15 NOTE — ED Notes (Signed)
Pt awake and irritated stating none of his needs are being met, when questioned what his needs are pt states I need all the phones to stop ringing and the beeping to stop and warm milk. Pt informed he is still in emergency room and we cannot turn off our phones or make the beeping stop completely, pt also informed that we do not have warm milk. Pt irritated and raising voice, pt returned to bed.
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
## 2022-11-15 NOTE — BH Assessment (Signed)
Comprehensive Clinical Assessment (CCA) Note  11/15/2022 Curtis Hodges [id number] Recommendations for Services/Supports/Treatments: Consulted with Lynder Parents., NP, who determined pt. meets inpatient criteria. AC notified  Curtis Hodges is a 52 year old Caucasian male with a psych hx of bipolar disorder, unspecified. Per triage note: Pt brought in by sister. Pt is VOL.  Sister reports recent death in family, anger issues, insomnia. Pt reports taking meds but adjustments have been made.  Upon assessment, when asked why he'd presented to the ER pt. expressed that he has sleep disturbance. Pt then went on bizarre tangents that were nonsensical and delusional. Pt had a hostile attitude and would begin yelling when responding to assessment questions. Pt presented with intense and inappropriate anger, and he easily agitated. Pt presented with a labile mood and a congruent affect. Pt's thoughts were loose and difficult to follow. The pt. had restless psychomotor activity. Pt did not appear to be responding to internal/external stimuli. The patient denied current SI, HI or AV/H. Pt did however report that he "goes through scenarios in my head trying to solve em".   Collateral 1: DOWELL,CHRISTY (Sister) [phone] At bedside: Sister reported that the pt's psychiatrist Dr. Sharlett Iles urged her to bring pt to the ED due to pt sending him a series of bizarre texts. Sister reported that the pt was unusually anxious and not himself when she came for a wellness check. Sister reported that the pt lives independently and works at [employer].  Collateral 2: Psychiatrist Dr. Sharlett Iles [phone]) Psychiatrist reported that the pt's text messages were indicative of a thought disorder. Psychiatrist expressed concerns that the pt's behavior is not at baseline.   Chief Complaint:  Chief Complaint  Patient presents with   Psychiatric Evaluation   Visit Diagnosis: Bipolar disorder, unspecified    CCA Screening, Triage  and Referral (STR)  Patient Reported Information How did you hear about [place]? Family/Friend  Referral name: No data recorded Referral phone number: No data recorded  Whom do you see for routine medical problems? No data recorded Practice/Facility Name: No data recorded Practice/Facility Phone Number: No data recorded Name of Contact: No data recorded Contact Number: No data recorded Contact Fax Number: No data recorded Prescriber Name: No data recorded Prescriber Address (if known): No data recorded  What Is the Reason for Your Visit/Call Today? Pt brought in by sister.Pt is VOL.  Sister reports recent death in family, anger issues, insomnia.     Pt reports taking meds but adjustments have been made.  How Long Has This Been Causing You Problems? <Week  What Do You Feel Would Help You the Most Today? Treatment for Depression or other mood problem   Have You Recently Been in Any Inpatient Treatment (Hospital/Detox/Crisis Center/28-Day Program)? No data recorded Name/Location of Program/Hospital:No data recorded How Long Were You There? No data recorded When Were You Discharged? No data recorded  Have You Ever Received Services From [facility] Before? No data recorded Who Do You See at [facility]? No data recorded  Have You Recently Had Any Thoughts About Hurting Yourself? No  Are You Planning to Commit Suicide/Harm Yourself At This time? No   Have you Recently Had Thoughts About [place]? No  Explanation: No data recorded  Have You Used Any Alcohol or Drugs in the Past 24 Hours? No  How Long Ago Did You Use Drugs or Alcohol? No data recorded What Did You Use and How Much? n/a   Do You Currently Have a Therapist/Psychiatrist? Yes  Name  of Therapist/Psychiatrist: Dr. Sharlett Iles [phone]   Have You Been Recently Discharged From Any Office Practice or Programs? No  Explanation of Discharge From Practice/Program: n/a     CCA Screening Triage Referral  Assessment Type of Contact: Face-to-Face  Is this Initial or Reassessment? No data recorded Date Telepsych consult ordered in [facility]:  No data recorded Time Telepsych consult ordered in [facility]:  No data recorded  Patient Reported Information Reviewed? No data recorded Patient Left Without Being Seen? No data recorded Reason for Not Completing Assessment: No data recorded  Collateral Involvement: DOWELL,CHRISTY (Sister) [phone]   Does Patient Have a Court Appointed Legal Guardian? No data recorded Name and Contact of Legal Guardian: No data recorded If Minor and Not Living with Parent(s), Who has Custody? n/a  Is CPS involved or ever been involved? Never  Is APS involved or ever been involved? Never   Patient Determined To Be At Risk for Harm To Self or Others Based on Review of Patient Reported Information or Presenting Complaint? No  Method: No Plan  Availability of Means: No access or NA  Intent: Vague intent or NA  Notification Required: No need or identified person  Additional Information for Danger to Others Potential: Active psychosis  Additional Comments for Danger to Others Potential: n/a  Are There Guns or Other Weapons in Your Home? No  Types of Guns/Weapons: n/a  Are These Weapons Safely Secured?                            No  Who Could Verify You Are Able To Have These Secured: n/a  Do You Have any Outstanding Charges, Pending Court Dates, Parole/Probation? None reported  Contacted To Inform of Risk of Harm To Self or Others: Other: Comment   Location of Assessment: [facility] ED   Does Patient Present under Involuntary Commitment? No  IVC Papers Initial File Date: No data recorded  [place] of Residence:    Patient Currently Receiving the Following Services: Medication Management   Determination of Need: Emergent (2 hours)   Options For Referral: Inpatient Hospitalization     CCA Biopsychosocial Intake/Chief Complaint:  No data  recorded Current Symptoms/Problems: No data recorded  Patient Reported Schizophrenia/Schizoaffective Diagnosis in Past: No   Strengths: Pt has good insight; pt is receptive to treatment; pt has support family  Preferences: No data recorded Abilities: No data recorded  Type of Services Patient Feels are Needed: No data recorded  Initial Clinical Notes/Concerns: No data recorded  Mental Health Symptoms Depression:   None   Duration of Depressive symptoms: No data recorded  Mania:   Irritability; Racing thoughts; Change in energy/activity   Anxiety:    Restlessness; Irritability; Tension; Worrying   Psychosis:   Grossly disorganized speech   Duration of Psychotic symptoms:  Less than six months   Trauma:   None   Obsessions:   None   Compulsions:   None   Inattention:   None   Hyperactivity/Impulsivity:   None   Oppositional/Defiant Behaviors:   Angry; Easily annoyed; Aggression towards people/animals   Emotional Irregularity:   Mood lability; Intense/inappropriate anger   Other Mood/Personality Symptoms:  No data recorded   Mental Status Exam Appearance and self-care  Stature:   Tall   Weight:   Average weight   Clothing:   -- (In scrubs)   Grooming:   Normal   Cosmetic use:   None   Posture/gait:   Normal   Motor  activity:   Restless   Sensorium  Attention:   Vigilant   Concentration:   Normal   Orientation:   Situation; Person; Place; Object   Recall/memory:   Normal   Affect and Mood  Affect:   Labile   Mood:   Angry   Relating  Eye contact:   Normal   Facial expression:   Angry   Attitude toward examiner:   Hostile   Thought and Language  Speech flow:  Loud   Thought content:   Appropriate to Mood and Circumstances   Preoccupation:   None   Hallucinations:   None   Organization:  No data recorded  [employer] of Knowledge:   Fair   Intelligence:   Needs investigation    Abstraction:   Concrete   Judgement:   Poor   Reality Testing:   Distorted   Insight:   Flashes of insight   Decision Making:   Impulsive   Social Functioning  Social Maturity:   Responsible   Social Judgement:   Normal   Stress  Stressors:   Other (Comment)   Coping Ability:   Overwhelmed   Skill Deficits:   Self-control   Supports:   Family; Friends/Service system     Religion: Religion/Spirituality Are You A Religious Person?:  (UTA) How Might This Affect Treatment?: UTA  Leisure/Recreation: Leisure / Recreation Do You Have Hobbies?: No  Exercise/Diet: Exercise/Diet Do You Exercise?: No Have You Gained or Lost A Significant Amount of Weight in the Past Six Months?: No Do You Follow a Special Diet?: No Do You Have Any Trouble Sleeping?: Yes Explanation of Sleeping Difficulties: Pt reported having sleep disturbance.   CCA Employment/Education Employment/Work Situation: Employment / Work Situation Employment Situation: Employed Work Stressors: None noted Patient's Job has Been Impacted by Current Illness: No Has Patient ever Been in Passenger transport manager?: No  Education: Education Is Patient Currently Attending School?: No Did Physicist, medical?: No Did You Have An Individualized Education Program (IIEP): No Did You Have Any Difficulty At [employer]?: No Patient's Education Has Been Impacted by Current Illness: No   CCA Family/Childhood History Family and Relationship History: Family history Marital status: Single  Childhood History:  Childhood History By whom was/is the patient raised?: Both parents Did patient suffer any verbal/emotional/physical/sexual abuse as a child?: No Did patient suffer from severe childhood neglect?: No Has patient ever been sexually abused/assaulted/raped as an adolescent or adult?: No Was the patient ever a victim of a crime or a disaster?: No Witnessed domestic violence?: No Has patient been affected by domestic  violence as an adult?: No  Child/Adolescent Assessment:     CCA Substance Use Alcohol/Drug Use: Alcohol / Drug Use Pain Medications: See MAR Prescriptions: See MAR Over the Counter: See MAR History of alcohol / drug use?: No history of alcohol / drug abuse                         ASAM's:  Six Dimensions of Multidimensional Assessment  Dimension 1:  Acute Intoxication and/or Withdrawal Potential:      Dimension 2:  Biomedical Conditions and Complications:      Dimension 3:  Emotional, Behavioral, or Cognitive Conditions and Complications:     Dimension 4:  Readiness to Change:     Dimension 5:  Relapse, Continued use, or Continued Problem Potential:     Dimension 6:  Recovery/Living Environment:     ASAM Severity Score:  ASAM Recommended Level of Treatment:     Substance use Disorder (SUD)    Recommendations for Services/Supports/Treatments:    DSM5 Diagnoses: Patient Active Problem List   Diagnosis Date Noted   Chronic venous insufficiency 09/29/2022   Lymphedema 09/29/2022   Cellulitis of foot, left 08/11/2021   Bilateral edema of lower extremity 05/04/2021   Adrenal nodule ([place]) 05/04/2021   Onychodystrophy 05/04/2021   Longstanding persistent atrial fibrillation ([place]) 04/25/2021   Medication management [id number]   Chronic systolic heart failure ([place]) 02/03/2021   Primary hypertension 02/03/2021   Bradycardia, unspecified 01/20/2021   Hypotension 01/09/2021   Single subsegmental pulmonary embolism without acute cor pulmonale ([place]) 01/09/2021   Swelling of toe of right foot 01/09/2021   NAFL (nonalcoholic fatty liver) 0000000   Bipolar disorder, unspecified ([place]) 09/17/2020   Hypothyroidism, unspecified 09/17/2020   Unspecified atrial fibrillation ([place]) 09/17/2020   Personal history of pulmonary embolism 09/17/2020   Eosinophilia, unspecified 09/17/2020   Anemia, unspecified 09/17/2020   Cardiomyopathy, unspecified ([place]) 0000000    Diastolic heart failure ([place]) 09/17/2020   Hypo-osmolality and hyponatremia 09/17/2020   Long term (current) use of anticoagulants 09/17/2020   Claris Pech R Benelli Winther, LCAS

## 2022-11-15 NOTE — BHH Suicide Risk Assessment (Signed)
[facility] Admission Suicide Risk Assessment   Nursing information obtained from:  Patient Demographic factors:  NA Current Mental Status:  NA Loss Factors:  NA Historical Factors:  NA Risk Reduction Factors:  NA  Total Time spent with patient: 45 minutes Principal Problem: Schizoaffective disorder, bipolar type ([place]) Diagnosis:  Principal Problem:   Schizoaffective disorder, bipolar type ([place]) Active Problems:   Hypothyroidism, unspecified   Unspecified atrial fibrillation ([place])   Personal history of pulmonary embolism   Bilateral edema of lower extremity   Chronic systolic heart failure (HCC)   Bipolar 1 disorder (HCC)  Subjective Data: Patient seen and chart reviewed.  52 year old man with a history of chronic mental illness worsening over the last couple months.  Not sleeping well hyperactive irritable made hostile statements towards workers and bizarre statements towards his psychiatrist.  Patient and family both recognize he is decompensating.  Continued Clinical Symptoms:  Alcohol Use Disorder Identification Test Final Score (AUDIT): 0 The "Alcohol Use Disorders Identification Test", Guidelines for Use in Primary Care, Second Edition.  World Pharmacologist [facility]). Score between 0-7:  no or low risk or alcohol related problems. Score between 8-15:  moderate risk of alcohol related problems. Score between 16-19:  high risk of alcohol related problems. Score 20 or above:  warrants further diagnostic evaluation for alcohol dependence and treatment.   CLINICAL FACTORS:   Bipolar Disorder:   Mixed State Schizophrenia:   Paranoid or undifferentiated type   Musculoskeletal: Strength & Muscle Tone: within normal limits Gait & Station: normal Patient leans: N/A  Psychiatric Specialty Exam:  Presentation  General Appearance:  Bizarre  Eye Contact: Minimal  Speech: Pressured; Clear and Coherent  Speech Volume: Increased  Handedness: Right   Mood and Affect   Mood: Angry; Irritable  Affect: Inappropriate; Full Range   Thought Process  Thought Processes: Coherent  Descriptions of Associations:Circumstantial  Orientation:Full (Time, Place and Person)  Thought Content:Logical; Obsessions  History of Schizophrenia/Schizoaffective disorder:No  Duration of Psychotic Symptoms:Less than six months  Hallucinations:Hallucinations: None  Ideas of Reference:None  Suicidal Thoughts:Suicidal Thoughts: No  Homicidal Thoughts:Homicidal Thoughts: No   Sensorium  Memory: Immediate Good; Recent Good; Remote Good  Judgment: Poor  Insight: Fair   Community education officer  Concentration: Poor  Attention Span: Poor  Recall: Good  Fund of Knowledge: Good  Language: Good   Psychomotor Activity  Psychomotor Activity: Psychomotor Activity: Normal   Assets  Assets: Communication Skills; Desire for Improvement; Social Support; Resilience; Physical Health   Sleep  Sleep: Sleep: Poor Number of Hours of Sleep: 6    Physical Exam: Physical Exam Vitals and nursing note reviewed.  Constitutional:      Appearance: Normal appearance.  HENT:     Head: Normocephalic and atraumatic.     Mouth/Throat:     Pharynx: Oropharynx is clear.  Eyes:     Pupils: Pupils are equal, round, and reactive to light.  Cardiovascular:     Rate and Rhythm: Normal rate and regular rhythm.  Pulmonary:     Effort: Pulmonary effort is normal.     Breath sounds: Normal breath sounds.  Abdominal:     General: Abdomen is flat.     Palpations: Abdomen is soft.  Musculoskeletal:        General: Normal range of motion.  Skin:    General: Skin is warm and dry.  Neurological:     General: No focal deficit present.     Mental Status: He is alert. Mental status is at baseline.  Psychiatric:        Attention and Perception: Attention normal.        Mood and Affect: Mood is anxious. Affect is blunt.        Speech: Speech is tangential.         Behavior: Behavior is withdrawn.        Thought Content: Thought content is paranoid. Thought content does not include homicidal or suicidal ideation.        Cognition and Memory: Cognition is impaired. Memory is impaired.        Judgment: Judgment is impulsive and inappropriate.    Review of Systems  Constitutional: Negative.   HENT: Negative.    Eyes: Negative.   Respiratory: Negative.    Cardiovascular: Negative.   Gastrointestinal: Negative.   Musculoskeletal: Negative.   Skin: Negative.   Neurological: Negative.   Psychiatric/Behavioral:  Negative for depression, hallucinations, substance abuse and suicidal ideas. The patient is nervous/anxious and has insomnia.    Blood pressure 138/85, pulse 87, temperature 98.3 F (36.8 C), temperature source Oral, resp. rate 18, height '6\' 5"'$  (1.956 m), weight 102.1 kg, SpO2 99 %. Body mass index is 26.68 kg/m.   COGNITIVE FEATURES THAT CONTRIBUTE TO RISK:  None    SUICIDE RISK:   Mild:  Suicidal ideation of limited frequency, intensity, duration, and specificity.  There are no identifiable plans, no associated intent, mild dysphoria and related symptoms, good self-control (both objective and subjective assessment), few other risk factors, and identifiable protective factors, including available and accessible social support.  PLAN OF CARE: Continue 15-minute checks.  Engage in individual and group therapy.  Adjust medication for symptoms.  Ongoing assessment of dangerousness prior to discharge  I certify that inpatient services furnished can reasonably be expected to improve the patient's condition.   Alethia Berthold, MD 11/15/2022, 2:41 PM

## 2022-11-15 NOTE — [facility] Suicide Risk Assessment (Signed)
[facility] INPATIENT:  Family/Significant Other Suicide Prevention Education  Suicide Prevention Education:  Education Completed; Merrilee Seashore, mother, [phone], has been identified by the patient as the family member/significant other with whom the patient will be residing, and identified as the person(s) who will aid the patient in the event of a mental health crisis (suicidal ideations/suicide attempt).  With written consent from the patient, the family member/significant other has been provided the following suicide prevention education, prior to the and/or following the discharge of the patient.  The suicide prevention education provided includes the following: Suicide risk factors Suicide prevention and interventions National Suicide Hotline telephone number [facility] assessment telephone number [facility] Emergency Assistance [place] and/or Residential Mobile Crisis Unit telephone number  Request made of family/significant other to: Remove weapons (e.g., guns, rifles, knives), all items previously/currently identified as safety concern.   Remove drugs/medications (over-the-counter, prescriptions, illicit drugs), all items previously/currently identified as a safety concern.  The family member/significant other verbalizes understanding of the suicide prevention education information provided.  The family member/significant other agrees to remove the items of safety concern listed above.  Mother reports that the patient has been "hyper" lately. She reports that she believed his hyperness to be related to his diagnosis of Bipolar Disorder.   She reports that patient was recently triggered by the death of a family friend. She also indicates that patient has become agitated as March Madness begins and he is big into basketball.  Mom reports no access to weapons and patient is not a danger to himself or others.    Rozann Lesches 11/15/2022, 10:59 AM

## 2022-11-15 NOTE — Progress Notes (Signed)
Patient had to be given IM Geodon after multiple out burst in his room out in the hall and at the nurses station.  Very loud and cannot be redirected.  Insist on being provided an Melburn Popper to go home to night.  Staff advised that he will have to wait until morning to see the doctor, but he is resistant. Patient was asked multiple times to lower his voice but he refused. Patient also went into another patients room waking them up.  Very disruptive to the unit.    C Butler-Nicholson, LPN

## 2022-11-15 NOTE — Consult Note (Signed)
[facility] Face-to-Face [facility]ry Consult   Reason for Consult:Psychiatric Evaluation  Referring Physician: Dr. Charna Hodges Patient Identification: Curtis Hodges MRN:  [id number] Principal Diagnosis: <principal problem not specified> Diagnosis:  Active Problems:   Bipolar disorder, unspecified ([place])   Anemia, unspecified   Bilateral edema of lower extremity   Bradycardia, unspecified   Hypotension   Primary hypertension   Disruptive mood dysregulation disorder ([place])   Total Time spent with patient: 1 hour  Subjective: "Turn off the lights or I am going to leave."  Curtis Hodges is a 52 y.o. male patient presented to [facility] ED with his sister due to a recent death in his family. The patient reports taking his prescribed medication, but there were some adjustments made, which Curtis Hodges disputes that it was not very recent. The patient is easily irritated or described as angry. The patient mood will go from "zero to ten" in seconds. This is the concerning factor that requires admission. The patient lives independently, and his sister checks in on him once a week. With his mood being so volatile, the patient is at significant risk of injuring someone, or he is at risk for someone hurting him. Curtis Hodges agreed and stated that the behavior the patient is presenting has never seen the patient mood like this. He shared that the patient is calm and cooperative; he does not raise his voice. The patient has a more serene, tranquil, and harmonious type mood. He shared that the patient is very manic. Curtis Hodges sees the patient via Zoom and has done so for many years.   This provider saw The patient face-to-face; the chart was reviewed, and Curtis Hodges was consulted on 11/14/2022 due to the patient's care. It was discussed with the EDP that the patient does meet the criteria to be admitted to the psychiatric inpatient unit.  On evaluation, the patient is alert and oriented x4, angry, mad, manic,  uncooperative, and mood-congruent with affect. The patient does not appear to be responding to internal or external stimuli. Neither is the patient presenting with any delusional thinking. The patient denies auditory or visual hallucinations currently, but he does admit to raising thoughts. The patient denies any suicidal, homicidal, or self-harm ideations. The patient is presenting with some psychotic behaviors. The patient is not presenting with any paranoid behaviors. During an encounter with the patient, he could not answer most questions appropriately due to his tendency to become angry quickly.  Collateral 1: Curtis Hodges (Sister) [phone] At bedside: Sister reported that the pt's psychiatrist Curtis Hodges urged her to bring pt to the ED due to pt sending him a series of bizarre texts. Sister reported that the pt was unusually anxious and not himself when she came for a wellness check. Sister reported that the pt lives independently and works at [employer].   Collateral 2: Psychiatrist Curtis Hodges [phone]) Psychiatrist reported that the pt's text messages were indicative of a thought disorder. Psychiatrist expressed concerns that the pt's behavior is not at baseline.   HPI: Per Curtis Hodges, Curtis Hodges is a 52 y.o. male with past medical history of bipolar disorder, atrial fibrillation on Xarelto, hypothyroidism, and diastolic CHF who presents to the ED for psychiatric evaluation.  Per patient's sister at bedside, he has been acting increasingly erratically over the past couple of days.  He has been dealing with the death of his father, has been having increased angry outburst per sister.  He has been taking his medications as prescribed, continues to follow  with psychiatry, who recommended he came to the ED today for further evaluation.  Patient reportedly send multiple incoherent text messages to his psychiatrist, who then got in contact with patient's family.  Patient denies any  suicidal or homicidal ideation, does admit that he has not slept much in a couple of weeks.    Past Psychiatric History:   Risk to Self:   Risk to Others:   Prior Inpatient Therapy:   Prior Outpatient Therapy:    Past Medical History:  Past Medical History:  Diagnosis Date   Atrial fibrillation ([place])    Hypertension    Thyroid disease     Past Surgical History:  Procedure Laterality Date   CHOLECYSTECTOMY     GALLBLADDER SURGERY     Family History:  Family History  Problem Relation Age of Onset   COPD Mother    Atrial fibrillation Mother    Brain cancer Father    Family Psychiatric  History:  Aunt-Bipolar Social History:  Social History   Substance and Sexual Activity  Alcohol Use Not Currently     Social History   Substance and Sexual Activity  Drug Use Never    Social History   Socioeconomic History   Marital status: Single    Spouse name: Not on file   Number of children: Not on file   Years of education: Not on file   Highest education level: Not on file  Occupational History   Not on file  Tobacco Use   Smoking status: Never   Smokeless tobacco: Never  Vaping Use   Vaping Use: Never used  Substance and Sexual Activity   Alcohol use: Not Currently   Drug use: Never   Sexual activity: Not Currently  Other Topics Concern   Not on file  Social History Narrative   Not on file   Social Determinants of Health   Financial Resource Strain: Low Risk  (07/23/2022)   Overall Financial Resource Strain (CARDIA)    Difficulty of Paying Living Expenses: Not hard at all  Food Insecurity: No Food Insecurity (07/23/2022)   Hunger Vital Sign    Worried About Running Out of Food in the Last Year: Never true    Ran Out of Food in the Last Year: Never true  Transportation Needs: No Transportation Needs (07/23/2022)   PRAPARE - Hydrologist (Medical): No    Lack of Transportation (Non-Medical): No  Physical Activity: Insufficiently  Active (07/23/2022)   Exercise Vital Sign    Days of Exercise per Week: 5 days    Minutes of Exercise per Session: 10 min  Stress: No Stress Concern Present (07/23/2022)   [place]    Feeling of Stress : Only a little  Social Connections: Socially Isolated (07/23/2022)   Social Connection and Isolation Panel [NHANES]    Frequency of Communication with Friends and Family: Once a week    Frequency of Social Gatherings with Friends and Family: Once a week    Attends Religious Services: Never    Marine scientist or Organizations: No    Attends Music therapist: 1 to 4 times per year    Marital Status: Never married   Additional Social History:    Allergies:   Allergies  Allergen Reactions   Cat Hair Extract     Labs:  Results for orders placed or performed during the Hodges encounter of 11/14/22 (from the past 48 hour(s))  Comprehensive metabolic panel     Status: Abnormal   Collection Time: 11/14/22  4:23 PM  Result Value Ref Range   Sodium 136 135 - 145 mmol/L   Potassium 4.2 3.5 - 5.1 mmol/L   Chloride 104 98 - 111 mmol/L   CO2 24 22 - 32 mmol/L   Glucose, Bld 124 (H) 70 - 99 mg/dL    Comment: Glucose reference range applies only to samples taken after fasting for at least 8 hours.   BUN 20 6 - 20 mg/dL   Creatinine, Ser 0.92 0.61 - 1.24 mg/dL   Calcium 9.7 8.9 - 10.3 mg/dL   Total Protein 8.0 6.5 - 8.1 g/dL   Albumin 4.4 3.5 - 5.0 g/dL   AST 26 15 - 41 U/L   ALT 22 0 - 44 U/L   Alkaline Phosphatase 114 38 - 126 U/L   Total Bilirubin 0.5 0.3 - 1.2 mg/dL   GFR, Estimated >60 >60 mL/min    Comment: (NOTE) Calculated using the CKD-EPI Creatinine Equation (2021)    Anion gap 8 5 - 15    Comment: Performed at [facility], [address] [place], [place] [address]  Ethanol     Status: None   Collection Time: 11/14/22  4:23 PM  Result Value Ref Range   Alcohol, Ethyl (B) <10  <10 mg/dL    Comment: (NOTE) Lowest detectable limit for serum alcohol is 10 mg/dL.  For medical purposes only. Performed at [facility], [address] [place], [place] [id number]   Salicylate level     Status: Abnormal   Collection Time: 11/14/22  4:23 PM  Result Value Ref Range   Salicylate Lvl [id number] (L) 7.0 - 30.0 mg/dL    Comment: Performed at [facility], [address] [place], [place] [address]  Acetaminophen level     Status: Abnormal   Collection Time: 11/14/22  4:23 PM  Result Value Ref Range   Acetaminophen (Tylenol), Serum <10 (L) 10 - 30 ug/mL    Comment: (NOTE) Therapeutic concentrations vary significantly. A range of 10-30 ug/mL  may be an effective concentration for many patients. However, some  are best treated at concentrations outside of this range. Acetaminophen concentrations >150 ug/mL at 4 hours after ingestion  and >50 ug/mL at 12 hours after ingestion are often associated with  toxic reactions.  Performed at [facility], [address] [place], [place] [address]   cbc     Status: None   Collection Time: 11/14/22  4:23 PM  Result Value Ref Range   WBC 8.0 4.0 - 10.5 K/uL   RBC 4.39 4.22 - 5.81 MIL/uL   Hemoglobin 14.1 13.0 - 17.0 g/dL   HCT 42.5 39.0 - 52.0 %   MCV 96.8 80.0 - 100.0 fL   MCH 32.1 26.0 - 34.0 pg   MCHC 33.2 30.0 - 36.0 g/dL   RDW 13.2 11.5 - 15.5 %   Platelets 321 150 - 400 K/uL   nRBC 0.0 0.0 - 0.2 %    Comment: Performed at [facility], [address] [place], [place] [address]  Urine Drug Screen, Qualitative     Status: None   Collection Time: 11/14/22  4:23 PM  Result Value Ref Range   Tricyclic, Ur Screen NONE DETECTED NONE DETECTED   Amphetamines, Ur Screen NONE DETECTED NONE DETECTED   MDMA (Ecstasy)Ur Screen NONE DETECTED NONE DETECTED   Cocaine Metabolite,Ur [place] NONE DETECTED NONE DETECTED   Opiate,  Ur Screen NONE DETECTED NONE DETECTED   Phencyclidine (PCP) Ur S NONE  DETECTED NONE DETECTED   Cannabinoid 50 Ng, Ur [place] NONE DETECTED NONE DETECTED   Barbiturates, Ur Screen NONE DETECTED NONE DETECTED   Benzodiazepine, Ur Scrn NONE DETECTED NONE DETECTED   Methadone Scn, Ur NONE DETECTED NONE DETECTED    Comment: (NOTE) Tricyclics + metabolites, urine    Cutoff 1000 ng/mL Amphetamines + metabolites, urine  Cutoff 1000 ng/mL MDMA (Ecstasy), urine              Cutoff 500 ng/mL Cocaine Metabolite, urine          Cutoff 300 ng/mL Opiate + metabolites, urine        Cutoff 300 ng/mL Phencyclidine (PCP), urine         Cutoff 25 ng/mL Cannabinoid, urine                 Cutoff 50 ng/mL Barbiturates + metabolites, urine  Cutoff 200 ng/mL Benzodiazepine, urine              Cutoff 200 ng/mL Methadone, urine                   Cutoff 300 ng/mL  The urine drug screen provides only a preliminary, unconfirmed analytical test result and should not be used for non-medical purposes. Clinical consideration and professional judgment should be applied to any positive drug screen result due to possible interfering substances. A more specific alternate chemical method must be used in order to obtain a confirmed analytical result. Gas chromatography / mass spectrometry (GC/MS) is the preferred confirm atory method. Performed at [facility], [address] [place], [place] [address]   Resp panel by RT-PCR (RSV, Flu A&B, Covid) Anterior Nasal Swab     Status: None   Collection Time: 11/15/22 12:50 AM   Specimen: Anterior Nasal Swab  Result Value Ref Range   SARS Coronavirus 2 by RT PCR NEGATIVE NEGATIVE    Comment: (NOTE) SARS-CoV-2 target nucleic acids are NOT DETECTED.  The SARS-CoV-2 RNA is generally detectable in upper respiratory specimens during the acute phase of infection. The lowest concentration of SARS-CoV-2 viral copies this assay can detect is 138 copies/mL. A negative result does not preclude SARS-Cov-2 infection and should not be used as the  sole basis for treatment or other patient management decisions. A negative result may occur with  improper specimen collection/handling, submission of specimen other than nasopharyngeal swab, presence of viral mutation(s) within the areas targeted by this assay, and inadequate number of viral copies(<138 copies/mL). A negative result must be combined with clinical observations, patient history, and epidemiological information. The expected result is Negative.  Fact Sheet for Patients:  EntrepreneurPulse.com.au  Fact Sheet for Healthcare Providers:  IncredibleEmployment.be  This test is no t yet approved or cleared by the [place] FDA and  has been authorized for detection and/or diagnosis of SARS-CoV-2 by FDA under an Emergency Use Authorization (EUA). This EUA will remain  in effect (meaning this test can be used) for the duration of the COVID-19 declaration under Section 564(b)(1) of the Act, 21 U.S.C.section 360bbb-3(b)(1), unless the authorization is terminated  or revoked sooner.       Influenza A by PCR NEGATIVE NEGATIVE   Influenza B by PCR NEGATIVE NEGATIVE    Comment: (NOTE) The Xpert Xpress SARS-CoV-2/FLU/RSV plus assay is intended as an aid in the diagnosis of influenza from Nasopharyngeal swab specimens and should not be used as a sole  basis for treatment. Nasal washings and aspirates are unacceptable for Xpert Xpress SARS-CoV-2/FLU/RSV testing.  Fact Sheet for Patients: EntrepreneurPulse.com.au  Fact Sheet for Healthcare Providers: IncredibleEmployment.be  This test is not yet approved or cleared by the [place] FDA and has been authorized for detection and/or diagnosis of SARS-CoV-2 by FDA under an Emergency Use Authorization (EUA). This EUA will remain in effect (meaning this test can be used) for the duration of the COVID-19 declaration under Section 564(b)(1) of the Act, 21  U.S.C. section 360bbb-3(b)(1), unless the authorization is terminated or revoked.     Resp Syncytial Virus by PCR NEGATIVE NEGATIVE    Comment: (NOTE) Fact Sheet for Patients: EntrepreneurPulse.com.au  Fact Sheet for Healthcare Providers: IncredibleEmployment.be  This test is not yet approved or cleared by the [place] FDA and has been authorized for detection and/or diagnosis of SARS-CoV-2 by FDA under an Emergency Use Authorization (EUA). This EUA will remain in effect (meaning this test can be used) for the duration of the COVID-19 declaration under Section 564(b)(1) of the Act, 21 U.S.C. section 360bbb-3(b)(1), unless the authorization is terminated or revoked.  Performed at [facility], [address] [place], [place] [address]     No current facility-administered medications for this encounter.   No current outpatient medications on file.   Facility-Administered Medications Ordered in Other Encounters  Medication Dose Route Frequency Provider Last Rate Last Admin   acetaminophen (TYLENOL) tablet 650 mg  650 mg Oral Q6H PRN Caroline Sauger, NP       alum & mag hydroxide-simeth (MAALOX/MYLANTA) 200-200-20 MG/5ML suspension 30 mL  30 mL Oral Q4H PRN Caroline Sauger, NP       busPIRone (BUSPAR) tablet 30 mg  30 mg Oral BID Caroline Sauger, NP       digoxin Fonnie Birkenhead) tablet 250 mcg  250 mcg Oral Daily Caroline Sauger, NP       levothyroxine (SYNTHROID) tablet 75 mcg  75 mcg Oral Q0600 Caroline Sauger, NP       magnesium hydroxide (MILK OF MAGNESIA) suspension 30 mL  30 mL Oral Daily PRN Caroline Sauger, NP       metoprolol succinate (TOPROL-XL) 24 hr tablet 50 mg  50 mg Oral QHS Caroline Sauger, NP       midodrine (PROAMATINE) tablet 5 mg  5 mg Oral BID WC Caroline Sauger, NP       mirtazapine (REMERON) tablet 30 mg  30 mg Oral QHS Caroline Sauger, NP       Oxcarbazepine  (TRILEPTAL) tablet 600 mg  600 mg Oral BID Caroline Sauger, NP       ramelteon (ROZEREM) tablet 8 mg  8 mg Oral QHS Caroline Sauger, NP       risperiDONE (RISPERDAL M-TABS) disintegrating tablet 0.5 mg  0.5 mg Oral Daily Caroline Sauger, NP       rivaroxaban (XARELTO) tablet 20 mg  20 mg Oral q morning Caroline Sauger, NP        Musculoskeletal: Strength & Muscle Tone: within normal limits Gait & Station: normal Patient leans: N/A  Psychiatric Specialty Exam:  Presentation  General Appearance:  Bizarre  Eye Contact: Minimal  Speech: Pressured; Clear and Coherent  Speech Volume: Increased  Handedness: Right   Mood and Affect  Mood: Angry; Irritable  Affect: Inappropriate; Full Range   Thought Process  Thought Processes: Coherent  Descriptions of Associations:Circumstantial  Orientation:Full (Time, Place and Person)  Thought Content:Logical; Obsessions  History of Schizophrenia/Schizoaffective disorder:No  Duration of Psychotic Symptoms:Less  than six months  Hallucinations:Hallucinations: None  Ideas of Reference:None  Suicidal Thoughts:Suicidal Thoughts: No  Homicidal Thoughts:Homicidal Thoughts: No   Sensorium  Memory: Immediate Good; Recent Good; Remote Good  Judgment: Poor  Insight: Fair   Community education officer  Concentration: Poor  Attention Span: Poor  Recall: Good  Fund of Knowledge: Good  Language: Good   Psychomotor Activity  Psychomotor Activity: Psychomotor Activity: Normal   Assets  Assets: Communication Skills; Desire for Improvement; Social Support; Resilience; Physical Health   Sleep  Sleep: Sleep: Poor Number of Hours of Sleep: 6   Physical Exam: Physical Exam Vitals and nursing note reviewed. Exam conducted with a chaperone present.  Constitutional:      Appearance: Normal appearance. He is normal weight.  HENT:     Head: Normocephalic and atraumatic.     Right Ear:  External ear normal.     Left Ear: External ear normal.     Nose: Nose normal.     Mouth/Throat:     Mouth: Mucous membranes are dry.  Cardiovascular:     Rate and Rhythm: Normal rate.     Pulses: Normal pulses.  Pulmonary:     Effort: Pulmonary effort is normal.  Musculoskeletal:        General: Normal range of motion.     Cervical back: Normal range of motion and neck supple.  Neurological:     General: No focal deficit present.     Mental Status: He is alert and oriented to person, place, and time.  Psychiatric:        Attention and Perception: Attention and perception normal.        Mood and Affect: Mood is anxious. Affect is inappropriate.        Speech: Speech is rapid and pressured and tangential.        Behavior: Behavior is uncooperative, agitated, aggressive and hyperactive.        Thought Content: Thought content is paranoid.        Cognition and Memory: Cognition and memory normal.        Judgment: Judgment is impulsive and inappropriate.    Review of Systems  Psychiatric/Behavioral:  The patient is nervous/anxious and has insomnia.   All other systems reviewed and are negative.  Blood pressure 133/82, pulse 88, temperature 98.2 F (36.8 C), resp. rate 16, height '6\' 5"'$  (1.956 m), weight 102.1 kg, SpO2 99 %. Body mass index is 26.68 kg/m.  Treatment Plan Summary: Medication management and Plan   Patient does meet the criteria for psychiatric inpatient admission.  Disposition: Recommend psychiatric Inpatient admission when medically cleared. Supportive therapy provided about ongoing stressors.   Caroline Sauger, NP 11/15/2022 2:20 AM

## 2022-11-15 NOTE — Plan of Care (Signed)
Patient in & out of his room. Appears anxious and pacing in the hallway at times. Patient refuses medicine and insisting to call his sister. Patient loud and yelled at staff. Patient accepted medicine later and talked to his sister. Patient denies SI,HI and AVH. Appetite and energy level good. Support and encouragement given.

## 2022-11-15 NOTE — Progress Notes (Signed)
Patient appears anxious and asking to get uber to get out from here. Redirected patient to get ready for breakfast. ADLs done and had breakfast. Synthroid offered but patient refused. Patient took all other medications except Risperdal. Patient stated that he takes at night. Patient needed redirections to find his room. Safety maintained with 15 min checks. Support and encouragement given.

## 2022-11-15 NOTE — [facility] Group Notes (Signed)
[place] Group Notes:  (Nursing/MHT/Case Management/Adjunct)  Date:  11/15/2022  Time:  4:45 PM  Type of Therapy:  Psychoeducational Skills  Participation Level:  Did Not Attend   Adela Lank [facility] 11/15/2022, 4:45 PM

## 2022-11-15 NOTE — Plan of Care (Signed)
Patient new to the unit, hasn't had time to progress  Problem: Education: Goal: Knowledge of General Education information will improve Description: Including pain rating scale, medication(s)/side effects and non-pharmacologic comfort measures Outcome: Not Progressing   Problem: Health Behavior/Discharge Planning: Goal: Ability to manage health-related needs will improve Outcome: Not Progressing   Problem: Clinical Measurements: Goal: Ability to maintain clinical measurements within normal limits will improve Outcome: Not Progressing Goal: Will remain free from infection Outcome: Not Progressing Goal: Diagnostic test results will improve Outcome: Not Progressing Goal: Respiratory complications will improve Outcome: Not Progressing Goal: Cardiovascular complication will be avoided Outcome: Not Progressing   Problem: Activity: Goal: Risk for activity intolerance will decrease Outcome: Not Progressing   Problem: Nutrition: Goal: Adequate nutrition will be maintained Outcome: Not Progressing   Problem: Coping: Goal: Level of anxiety will decrease Outcome: Not Progressing   Problem: Elimination: Goal: Will not experience complications related to bowel motility Outcome: Not Progressing Goal: Will not experience complications related to urinary retention Outcome: Not Progressing   Problem: Pain Managment: Goal: General experience of comfort will improve Outcome: Not Progressing   Problem: Safety: Goal: Ability to remain free from injury will improve Outcome: Not Progressing   Problem: Skin Integrity: Goal: Risk for impaired skin integrity will decrease Outcome: Not Progressing

## 2022-11-15 NOTE — Progress Notes (Signed)
Patient has been isolative to his room this evening stating he wants to sleep. Pt denies SI/HI/AVH. Pt compliant with medication administration per MD orders. Pt given education, support, and encouragement to be active in his treatment plan. Pt being monitored Q 15 minutes for safety, remains safe on the unit

## 2022-11-15 NOTE — [facility] Group Notes (Signed)
[facility] Group Notes:  (Nursing/MHT/Case Management/Adjunct)  Date:  11/15/2022  Time:  9:21 AM  Type of Therapy:   community meeting  Participation Level:  Did Not Attend    Antonieta Pert 11/15/2022, 9:21 AM

## 2022-11-15 NOTE — H&P (Signed)
Psychiatric Admission Assessment Adult  Patient Identification: Curtis Hodges MRN:  [id number] Date of Evaluation:  11/15/2022 Chief Complaint:  Bipolar 1 disorder ([place]) [F31.9] Principal Diagnosis: Schizoaffective disorder, bipolar type ([place]) Diagnosis:  Principal Problem:   Schizoaffective disorder, bipolar type ([place]) Active Problems:   Hypothyroidism, unspecified   Unspecified atrial fibrillation ([place])   Personal history of pulmonary embolism   Bilateral edema of lower extremity   Chronic systolic heart failure ([place])   Bipolar 1 disorder ([place])  History of Present Illness: Patient seen and chart reviewed.  52 year old man with chronic mental health problems brought to the hospital by family and himself with concerns that he is decompensating.  Over the last month or so he has been having more difficulty sleeping.  Restless only sleeping a few minutes at a time sometimes.  Feeling more irritable.  He says he has been feeling angry with coworkers and has made some angry statements towards them but he denies homicidal thought.  Also having more physical symptoms feeling some discomfort in his chest from time to time.  Patient recently sent some very bizarre text messages to his psychiatrist which alerted the psychiatrist and notify the family.  Patient says he has been compliant with his medications but he is on relatively less medicine than he has been at times in the past.  Completely denies drugs or alcohol.  Spoke with his sister who confirms that the patient's symptoms have been worsening over the last couple months threatening his job and his relationships with others. Associated Signs/Symptoms: Depression Symptoms:  insomnia, anxiety, (Hypo) Manic Symptoms:  Impulsivity, Irritable Mood, Labiality of Mood, Anxiety Symptoms:  Excessive Worry, Psychotic Symptoms:  Ideas of Reference, Paranoia, PTSD Symptoms: Negative Total Time spent with patient: 45 minutes  Past Psychiatric  History: Patient has a history of chronic mental health problems going back to around his college years.  Graduated from college but has never really functioned at the appropriate level since.  Currently works the night shift at [employer].  Lives by himself.  Patient has had hospitalizations in the past most recently within the last couple years down in [place].  He has a Teacher, music he sees in [place].  Has been on higher doses of medicine in the past but more recently not on as much antipsychotic.  No history of suicide attempts.  No history of violence.  Diagnosis of bipolar disorder although the history he is giving much more consistent with schizoaffective disorder.  Is the patient at risk to self? Yes.    Has the patient been a risk to self in the past 6 months? Yes.    Has the patient been a risk to self within the distant past? Yes.    Is the patient a risk to others? Yes.    Has the patient been a risk to others in the past 6 months? Yes.    Has the patient been a risk to others within the distant past? Yes.     [place] Scale:  Flowsheet Row Admission (Current) from 11/15/2022 in [place] ED from 11/14/2022 in [facility] Emergency Department at [facility] ED from 08/19/2022 in [facility] [place] No Risk No Risk No Risk        Prior Inpatient Therapy: Yes.   If yes, describe has had inpatient treatment in the past as recently as the last couple years Prior Outpatient Therapy: Yes.   If yes, describe regular outpatient  treatment now done through his doctor in [place]  Alcohol Screening: 1. How often do you have a drink containing alcohol?: Never 2. How many drinks containing alcohol do you have on a typical day when you are drinking?: 1 or 2 3. How often do you have six or more drinks on one occasion?: Never AUDIT-C Score: 0 4. How often during the last year have you found that you were not able to stop  drinking once you had started?: Never 5. How often during the last year have you failed to do what was normally expected from you because of drinking?: Never 6. How often during the last year have you needed a first drink in the morning to get yourself going after a heavy drinking session?: Never 7. How often during the last year have you had a feeling of guilt of remorse after drinking?: Never 8. How often during the last year have you been unable to remember what happened the night before because you had been drinking?: Never 9. Have you or someone else been injured as a result of your drinking?: No 10. Has a relative or friend or a doctor or another health worker been concerned about your drinking or suggested you cut down?: No Alcohol Use Disorder Identification Test Final Score (AUDIT): 0 Substance Abuse History in the last 12 months:  No. Consequences of Substance Abuse: Negative Previous Psychotropic Medications: Yes  Psychological Evaluations: Yes  Past Medical History:  Past Medical History:  Diagnosis Date   Atrial fibrillation ([place])    Hypertension    Thyroid disease     Past Surgical History:  Procedure Laterality Date   CHOLECYSTECTOMY     GALLBLADDER SURGERY     Family History:  Family History  Problem Relation Age of Onset   COPD Mother    Atrial fibrillation Mother    Brain cancer Father    Family Psychiatric  History: None reported Tobacco Screening:  Social History   Tobacco Use  Smoking Status Never  Smokeless Tobacco Never    BH Tobacco Counseling     Are you interested in Tobacco Cessation Medications?  N/A, patient does not use tobacco products Counseled patient on smoking cessation:  N/A, patient does not use tobacco products Reason Tobacco Screening Not Completed: No value filed.       Social History:  Social History   Substance and Sexual Activity  Alcohol Use Not Currently     Social History   Substance and Sexual Activity  Drug Use  Never    Additional Social History: Marital status: Single                         Allergies:   Allergies  Allergen Reactions   Cat Hair Extract    Lab Results:  Results for orders placed or performed during the hospital encounter of 11/14/22 (from the past 48 hour(s))  Comprehensive metabolic panel     Status: Abnormal   Collection Time: 11/14/22  4:23 PM  Result Value Ref Range   Sodium 136 135 - 145 mmol/L   Potassium 4.2 3.5 - 5.1 mmol/L   Chloride 104 98 - 111 mmol/L   CO2 24 22 - 32 mmol/L   Glucose, Bld 124 (H) 70 - 99 mg/dL    Comment: Glucose reference range applies only to samples taken after fasting for at least 8 hours.   BUN 20 6 - 20 mg/dL   Creatinine, Ser 0.92 0.61 - 1.24  mg/dL   Calcium 9.7 8.9 - 10.3 mg/dL   Total Protein 8.0 6.5 - 8.1 g/dL   Albumin 4.4 3.5 - 5.0 g/dL   AST 26 15 - 41 U/L   ALT 22 0 - 44 U/L   Alkaline Phosphatase 114 38 - 126 U/L   Total Bilirubin 0.5 0.3 - 1.2 mg/dL   GFR, Estimated >60 >60 mL/min    Comment: (NOTE) Calculated using the CKD-EPI Creatinine Equation (2021)    Anion gap 8 5 - 15    Comment: Performed at [facility], [address] [place], [place] [address]  Ethanol     Status: None   Collection Time: 11/14/22  4:23 PM  Result Value Ref Range   Alcohol, Ethyl (B) <10 <10 mg/dL    Comment: (NOTE) Lowest detectable limit for serum alcohol is 10 mg/dL.  For medical purposes only. Performed at [facility], [address] [place], [place] [id number]   Salicylate level     Status: Abnormal   Collection Time: 11/14/22  4:23 PM  Result Value Ref Range   Salicylate Lvl [id number] (L) 7.0 - 30.0 mg/dL    Comment: Performed at [facility], [address] [place], [place] [address]  Acetaminophen level     Status: Abnormal   Collection Time: 11/14/22  4:23 PM  Result Value Ref Range   Acetaminophen (Tylenol), Serum <10 (L) 10 - 30 ug/mL    Comment: (NOTE) Therapeutic concentrations  vary significantly. A range of 10-30 ug/mL  may be an effective concentration for many patients. However, some  are best treated at concentrations outside of this range. Acetaminophen concentrations >150 ug/mL at 4 hours after ingestion  and >50 ug/mL at 12 hours after ingestion are often associated with  toxic reactions.  Performed at [facility], [address] [place], [place] [address]   cbc     Status: None   Collection Time: 11/14/22  4:23 PM  Result Value Ref Range   WBC 8.0 4.0 - 10.5 K/uL   RBC 4.39 4.22 - 5.81 MIL/uL   Hemoglobin 14.1 13.0 - 17.0 g/dL   HCT 42.5 39.0 - 52.0 %   MCV 96.8 80.0 - 100.0 fL   MCH 32.1 26.0 - 34.0 pg   MCHC 33.2 30.0 - 36.0 g/dL   RDW 13.2 11.5 - 15.5 %   Platelets 321 150 - 400 K/uL   nRBC 0.0 0.0 - 0.2 %    Comment: Performed at [facility], [address] [place], [place] [address]  Urine Drug Screen, Qualitative     Status: None   Collection Time: 11/14/22  4:23 PM  Result Value Ref Range   Tricyclic, Ur Screen NONE DETECTED NONE DETECTED   Amphetamines, Ur Screen NONE DETECTED NONE DETECTED   MDMA (Ecstasy)Ur Screen NONE DETECTED NONE DETECTED   Cocaine Metabolite,Ur [place] NONE DETECTED NONE DETECTED   Opiate, Ur Screen NONE DETECTED NONE DETECTED   Phencyclidine (PCP) Ur S NONE DETECTED NONE DETECTED   Cannabinoid 50 Ng, Ur  NONE DETECTED NONE DETECTED   Barbiturates, Ur Screen NONE DETECTED NONE DETECTED   Benzodiazepine, Ur Scrn NONE DETECTED NONE DETECTED   Methadone Scn, Ur NONE DETECTED NONE DETECTED    Comment: (NOTE) Tricyclics + metabolites, urine    Cutoff 1000 ng/mL Amphetamines + metabolites, urine  Cutoff 1000 ng/mL MDMA (Ecstasy), urine              Cutoff 500 ng/mL Cocaine Metabolite, urine  Cutoff 300 ng/mL Opiate + metabolites, urine        Cutoff 300 ng/mL Phencyclidine (PCP), urine         Cutoff 25 ng/mL Cannabinoid, urine                 Cutoff 50 ng/mL Barbiturates +  metabolites, urine  Cutoff 200 ng/mL Benzodiazepine, urine              Cutoff 200 ng/mL Methadone, urine                   Cutoff 300 ng/mL  The urine drug screen provides only a preliminary, unconfirmed analytical test result and should not be used for non-medical purposes. Clinical consideration and professional judgment should be applied to any positive drug screen result due to possible interfering substances. A more specific alternate chemical method must be used in order to obtain a confirmed analytical result. Gas chromatography / mass spectrometry (GC/MS) is the preferred confirm atory method. Performed at [facility], [address] [place], [place] [address]   Resp panel by RT-PCR (RSV, Flu A&B, Covid) Anterior Nasal Swab     Status: None   Collection Time: 11/15/22 12:50 AM   Specimen: Anterior Nasal Swab  Result Value Ref Range   SARS Coronavirus 2 by RT PCR NEGATIVE NEGATIVE    Comment: (NOTE) SARS-CoV-2 target nucleic acids are NOT DETECTED.  The SARS-CoV-2 RNA is generally detectable in upper respiratory specimens during the acute phase of infection. The lowest concentration of SARS-CoV-2 viral copies this assay can detect is 138 copies/mL. A negative result does not preclude SARS-Cov-2 infection and should not be used as the sole basis for treatment or other patient management decisions. A negative result may occur with  improper specimen collection/handling, submission of specimen other than nasopharyngeal swab, presence of viral mutation(s) within the areas targeted by this assay, and inadequate number of viral copies(<138 copies/mL). A negative result must be combined with clinical observations, patient history, and epidemiological information. The expected result is Negative.  Fact Sheet for Patients:  EntrepreneurPulse.com.au  Fact Sheet for Healthcare Providers:  IncredibleEmployment.be  This test is no t  yet approved or cleared by the [place] FDA and  has been authorized for detection and/or diagnosis of SARS-CoV-2 by FDA under an Emergency Use Authorization (EUA). This EUA will remain  in effect (meaning this test can be used) for the duration of the COVID-19 declaration under Section 564(b)(1) of the Act, 21 U.S.C.section 360bbb-3(b)(1), unless the authorization is terminated  or revoked sooner.       Influenza A by PCR NEGATIVE NEGATIVE   Influenza B by PCR NEGATIVE NEGATIVE    Comment: (NOTE) The Xpert Xpress SARS-CoV-2/FLU/RSV plus assay is intended as an aid in the diagnosis of influenza from Nasopharyngeal swab specimens and should not be used as a sole basis for treatment. Nasal washings and aspirates are unacceptable for Xpert Xpress SARS-CoV-2/FLU/RSV testing.  Fact Sheet for Patients: EntrepreneurPulse.com.au  Fact Sheet for Healthcare Providers: IncredibleEmployment.be  This test is not yet approved or cleared by the [place] FDA and has been authorized for detection and/or diagnosis of SARS-CoV-2 by FDA under an Emergency Use Authorization (EUA). This EUA will remain in effect (meaning this test can be used) for the duration of the COVID-19 declaration under Section 564(b)(1) of the Act, 21 U.S.C. section 360bbb-3(b)(1), unless the authorization is terminated or revoked.     Resp Syncytial Virus by PCR NEGATIVE NEGATIVE  Comment: (NOTE) Fact Sheet for Patients: EntrepreneurPulse.com.au  Fact Sheet for Healthcare Providers: IncredibleEmployment.be  This test is not yet approved or cleared by the [place] FDA and has been authorized for detection and/or diagnosis of SARS-CoV-2 by FDA under an Emergency Use Authorization (EUA). This EUA will remain in effect (meaning this test can be used) for the duration of the COVID-19 declaration under Section 564(b)(1) of the Act, 21  U.S.C. section 360bbb-3(b)(1), unless the authorization is terminated or revoked.  Performed at [facility], [address] [place], [place] [address]     Blood Alcohol level:  Lab Results  Component Value Date   [facility] <10 [id number]    Metabolic Disorder Labs:  Lab Results  Component Value Date   HGBA1C 5.7 (H) 07/23/2022   MPG 117 07/23/2022   No results found for: "PROLACTIN" Lab Results  Component Value Date   CHOL 155 07/23/2022   TRIG 67 07/23/2022   HDL 53 07/23/2022   CHOLHDL 2.9 07/23/2022   LDLCALC 87 07/23/2022    Current Medications: Current Facility-Administered Medications  Medication Dose Route Frequency Provider Last Rate Last Admin   acetaminophen (TYLENOL) tablet 650 mg  650 mg Oral Q6H PRN Caroline Sauger, NP       alum & mag hydroxide-simeth (MAALOX/MYLANTA) 200-200-20 MG/5ML suspension 30 mL  30 mL Oral Q4H PRN Caroline Sauger, NP       haloperidol (HALDOL) tablet 5 mg  5 mg Oral Q6H PRN Caroline Sauger, NP       And   benztropine (COGENTIN) tablet 1 mg  1 mg Oral Q6H PRN Caroline Sauger, NP       busPIRone (BUSPAR) tablet 15 mg  15 mg Oral BID Ashton Belote, Madie Reno, MD       digoxin Fonnie Birkenhead) tablet 250 mcg  250 mcg Oral Daily Caroline Sauger, NP   250 mcg at 11/15/22 [id number]   levothyroxine (SYNTHROID) tablet 75 mcg  75 mcg Oral Q0600 Caroline Sauger, NP       risperiDONE (RISPERDAL M-TABS) disintegrating tablet 2 mg  2 mg Oral Q8H PRN Caroline Sauger, NP       And   LORazepam (ATIVAN) tablet 1 mg  1 mg Oral PRN Caroline Sauger, NP       magnesium hydroxide (MILK OF MAGNESIA) suspension 30 mL  30 mL Oral Daily PRN Caroline Sauger, NP       metoprolol succinate (TOPROL-XL) 24 hr tablet 50 mg  50 mg Oral QHS Caroline Sauger, NP       midodrine (PROAMATINE) tablet 5 mg  5 mg Oral BID WC Caroline Sauger, NP   5 mg at 11/15/22 [id number]   mirtazapine (REMERON) tablet 15 mg  15 mg Oral QHS Aanchal Cope,  Madie Reno, MD       Oxcarbazepine (TRILEPTAL) tablet 600 mg  600 mg Oral BID Caroline Sauger, NP   600 mg at 11/15/22 [id number]   QUEtiapine (SEROQUEL) tablet 100 mg  100 mg Oral QHS Zafiro Routson T, MD       ramelteon (ROZEREM) tablet 8 mg  8 mg Oral QHS Caroline Sauger, NP       Derrill Memo ON 11/16/2022] risperiDONE (RISPERDAL M-TABS) disintegrating tablet 1.5 mg  1.5 mg Oral QHS Michiah Mudry, Madie Reno, MD       rivaroxaban (XARELTO) tablet 20 mg  20 mg Oral q morning Caroline Sauger, NP   20 mg at 11/15/22 0830   PTA Medications: Medications Prior to Admission  Medication Sig Dispense Refill Last  Dose   busPIRone (BUSPAR) 15 MG tablet Take 30 mg by mouth 2 (two) times daily.      digoxin (LANOXIN) 0.25 MG tablet Take 250 mcg by mouth daily.      levothyroxine (SYNTHROID) 75 MCG tablet Take 1 tablet (75 mcg total) by mouth daily. 90 tablet 3    metoprolol succinate (TOPROL-XL) 50 MG 24 hr tablet Take 50 mg by mouth at bedtime.      midodrine (PROAMATINE) 5 MG tablet Take 5 mg by mouth 2 (two) times daily with a meal.      mirtazapine (REMERON) 30 MG tablet Take 30 mg by mouth at bedtime.      mupirocin ointment (BACTROBAN) 2 % Apply 1 Application topically 2 (two) times daily. 22 g 0    oxcarbazepine (TRILEPTAL) 600 MG tablet Take 600 mg by mouth 2 (two) times daily.      ramelteon (ROZEREM) 8 MG tablet Take 8 mg by mouth at bedtime.      risperiDONE (RISPERDAL M-TABS) 0.5 MG disintegrating tablet Take 0.5 mg by mouth daily.      XARELTO 20 MG TABS tablet Take 20 mg by mouth every morning.       Musculoskeletal: Strength & Muscle Tone: within normal limits Gait & Station: normal Patient leans: N/A            Psychiatric Specialty Exam:  Presentation  General Appearance:  Bizarre  Eye Contact: Minimal  Speech: Pressured; Clear and Coherent  Speech Volume: Increased  Handedness: Right   Mood and Affect  Mood: Angry; Irritable  Affect: Inappropriate; Full  Range   Thought Process  Thought Processes: Coherent  Duration of Psychotic Symptoms: Chronic but worsening over the past 1-1/2 months with worsening paranoia Past Diagnosis of Schizophrenia or Psychoactive disorder: No  Descriptions of Associations:Circumstantial  Orientation:Full (Time, Place and Person)  Thought Content:Logical; Obsessions  Hallucinations:Hallucinations: None  Ideas of Reference:None  Suicidal Thoughts:Suicidal Thoughts: No  Homicidal Thoughts:Homicidal Thoughts: No   Sensorium  Memory: Immediate Good; Recent Good; Remote Good  Judgment: Poor  Insight: Fair   Community education officer  Concentration: Poor  Attention Span: Poor  Recall: Good  Fund of Knowledge: Good  Language: Good   Psychomotor Activity  Psychomotor Activity: Psychomotor Activity: Normal   Assets  Assets: Communication Skills; Desire for Improvement; Social Support; Resilience; Physical Health   Sleep  Sleep: Sleep: Poor Number of Hours of Sleep: 6    Physical Exam: Physical Exam Vitals and nursing note reviewed.  Constitutional:      Appearance: Normal appearance.  HENT:     Head: Normocephalic and atraumatic.     Mouth/Throat:     Pharynx: Oropharynx is clear.  Eyes:     Pupils: Pupils are equal, round, and reactive to light.  Cardiovascular:     Rate and Rhythm: Normal rate and regular rhythm.  Pulmonary:     Effort: Pulmonary effort is normal.     Breath sounds: Normal breath sounds.  Abdominal:     General: Abdomen is flat.     Palpations: Abdomen is soft.  Musculoskeletal:        General: Normal range of motion.  Skin:    General: Skin is warm and dry.  Neurological:     General: No focal deficit present.     Mental Status: He is alert. Mental status is at baseline.  Psychiatric:        Attention and Perception: Attention normal.        Mood  and Affect: Mood is anxious. Affect is blunt.        Speech: Speech is tangential.         Behavior: Behavior is withdrawn.        Thought Content: Thought content is paranoid.        Cognition and Memory: Cognition is impaired. Memory is impaired.        Judgment: Judgment is inappropriate.    Review of Systems  Constitutional: Negative.   HENT: Negative.    Eyes: Negative.   Respiratory: Negative.    Cardiovascular: Negative.   Gastrointestinal: Negative.   Musculoskeletal: Negative.   Skin: Negative.   Neurological: Negative.   Psychiatric/Behavioral:  Negative for depression, hallucinations, substance abuse and suicidal ideas. The patient is nervous/anxious and has insomnia.    Blood pressure 138/85, pulse 87, temperature 98.3 F (36.8 C), temperature source Oral, resp. rate 18, height '6\' 5"'$  (1.956 m), weight 102.1 kg, SpO2 99 %. Body mass index is 26.68 kg/m.  Treatment Plan Summary: Daily contact with patient to assess and evaluate symptoms and progress in treatment, Medication management, and Plan patient not only has mental health problems but multiple medical issues.  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  Also hypothyroidism.  Reviewed medicines he is really has not been on enough antipsychotic and the Trileptal may not be very effective.  I recommend cutting back on some antidepressants decreasing the dose of mirtazapine and BuSpar and starting Seroquel at night for bipolar disorder.  Patient agreeable.  Spoke with his sister with his consent.  Observation Level/Precautions:  15 minute checks  Laboratory:  Chemistry Profile  Psychotherapy:    Medications:    Consultations:    Discharge Concerns:    Estimated LOS:  Other:     Physician Treatment Plan for Primary Diagnosis: Schizoaffective disorder, bipolar type ([place]) Long Term Goal(s): Improvement in symptoms so as ready for discharge  Short Term Goals: Ability to verbalize feelings will improve,  Ability to demonstrate self-control will improve, and Ability to identify and develop effective coping behaviors will improve  Physician Treatment Plan for Secondary Diagnosis: Principal Problem:   Schizoaffective disorder, bipolar type ([place]) Active Problems:   Hypothyroidism, unspecified   Unspecified atrial fibrillation ([place])   Personal history of pulmonary embolism   Bilateral edema of lower extremity   Chronic systolic heart failure ([place])   Bipolar 1 disorder ([place])  Long Term Goal(s): Improvement in symptoms so as ready for discharge  Short Term Goals: Ability to maintain clinical measurements within normal limits will improve and Compliance with prescribed medications will improve  I certify that inpatient services furnished can reasonably be expected to improve the patient's condition.    Alethia Berthold, MD 2/29/20242:43 PM

## 2022-11-15 NOTE — Group Note (Signed)
Recreation Therapy Group Note   Group Topic:Relaxation  Group Date: 11/15/2022 Start Time: 1000 End Time: 1040 Facilitators: Vilma Prader, LRT, CTRS Location:  Dayroom  Group Description: PMR (Progressive Muscle Relaxation). LRT asks patients their current level of stress/anxiety from 1-10, with 10 being the highest. LRT educates patients on what PMR is and the benefits that come from it. Patients are asked to sit with their feet flat on the floor while sitting up and all the way back in their chair, if possible. LRT follows prompt that requires the patients to tense and release different muscles in their body and focus on their breathing. During session, lights are off and soft music is being played. At the end of the prompt, LRT asks patients to rank their current levels of stress/anxiety from 1-10, 10 being the highest.   Affect/Mood: Anxious   Participation Level: Minimal   Participation Quality: N/A   Behavior: Alert   Speech/Thought Process: Distracted   Insight: Limited   Judgement: Limited   Modes of Intervention: Activity and Education   Patient Response to Interventions:  Receptive   Education Outcome:  In group clarification offered    Clinical Observations/Individualized Feedback: Curtis Hodges was minimally active in their participation of session activities and group discussion. Pt was present during educational section of group and left in the middle of the relaxation prompt stating "I am going to try to get some sleep in my room". Pt came back to group with minutes remaining. Pt did not fill out his sheet and took it to his room with him when he left the first time.    Plan: Continue to engage patient in RT group sessions 2-3x/week.   Vilma Prader, LRT, CTRS 11/15/2022 11:12 AM

## 2022-11-15 NOTE — Progress Notes (Signed)
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 He also stated he needed an uber and to get the fuck out of here now. Pt was redirected to his room. Pt came back up to the nurses station yelling and went into another patients room, when the MHT and security guard stopped him and got the patient back to his room. The NP on call was called and they put in PRN medication. Pt took the PRN injection willingly. Pt is in his room resting and without complaint at this time. Pt remains safe on the unit, 15 minute checks to be maintained.

## 2022-11-15 NOTE — Progress Notes (Signed)
Patient was given PRN medication IM. Pt took the medication willingly. Security and staff were present but we didn't have to do a manual hold

## 2022-11-15 NOTE — Progress Notes (Signed)
Patient came out of his room naked and came to the nurses station stating he was going home. Patient walked down the blue hall and tried to get out of the emergency exit. Pt was redirected by staff and security to his room. Pt only has PRN PO medications and he is refusing them. NP on call notified, waiting to hear back.

## 2022-11-15 NOTE — Progress Notes (Signed)
This Probation officer held his 0600 scheduled medication because he is still asleep from the PRN medication given at 0400. Pt is asleep at this time, continued safety checks being done Q 15 minutes

## 2022-11-15 NOTE — Progress Notes (Signed)
Patient admitted from [facility] ED, report received from [place], [place]. Pt was irritable upon arrival because he had to wake up to come down to the unit. Pt given education, and support. Pt wanted to go to sleep. Pt oriented to his room and the unit. Skin assessment completed with Cleo, RN. Pt given support, education, and encouragement to be active in his treatment plan. Pt being monitored Q 15 minutes for safety per unit protocol, remains safe on the unit

## 2022-11-15 NOTE — Tx Team (Signed)
Initial Treatment Plan 11/15/2022 2:34 AM [name] [id number]    PATIENT STRESSORS: Medication change or noncompliance   Traumatic event     PATIENT STRENGTHS: Motivation for treatment/growth  Supportive family/friends    PATIENT IDENTIFIED PROBLEMS: Anger outbursts  Insomnia  Anxiety                 DISCHARGE CRITERIA:  Motivation to continue treatment in a less acute level of care Verbal commitment to aftercare and medication compliance  PRELIMINARY DISCHARGE PLAN: Outpatient therapy Return to previous living arrangement  PATIENT/FAMILY INVOLVEMENT: This treatment plan has been presented to and reviewed with the patient, Curtis Hodges. The patient has been given the opportunity to ask questions and make suggestions.  Mallie Darting, RN 11/15/2022, 2:34 AM

## 2022-11-16 DIAGNOSIS — F250 Schizoaffective disorder, bipolar type: Secondary | ICD-10-CM | POA: Diagnosis not present

## 2022-11-16 MED ORDER — LORAZEPAM 2 MG PO TABS
2.0000 mg | ORAL_TABLET | ORAL | Status: DC | PRN
  Administered 2022-11-16 – 2022-11-26 (×5): 2 mg via ORAL
  Filled 2022-11-16 (×5): qty 1

## 2022-11-16 MED ORDER — QUETIAPINE FUMARATE 200 MG PO TABS
200.0000 mg | ORAL_TABLET | Freq: Every day | ORAL | Status: DC
  Administered 2022-11-16: 200 mg via ORAL
  Filled 2022-11-16: qty 1

## 2022-11-16 MED ORDER — TEMAZEPAM 15 MG PO CAPS
15.0000 mg | ORAL_CAPSULE | Freq: Every evening | ORAL | Status: DC | PRN
  Administered 2022-11-17 – 2022-11-27 (×4): 15 mg via ORAL
  Filled 2022-11-16 (×5): qty 1

## 2022-11-16 MED ORDER — DIPHENHYDRAMINE HCL 25 MG PO CAPS
50.0000 mg | ORAL_CAPSULE | Freq: Four times a day (QID) | ORAL | Status: DC | PRN
  Administered 2022-11-19 – 2022-11-21 (×2): 50 mg via ORAL
  Filled 2022-11-16 (×2): qty 2

## 2022-11-16 MED ORDER — HALOPERIDOL LACTATE 5 MG/ML IJ SOLN: 5.0000 mg | Freq: Four times a day (QID) | INTRAMUSCULAR | Status: DC | PRN

## 2022-11-16 MED ORDER — DIPHENHYDRAMINE HCL 50 MG/ML IJ SOLN: 50.0000 mg | Freq: Four times a day (QID) | INTRAMUSCULAR | Status: DC | PRN

## 2022-11-16 MED ORDER — DIAZEPAM 5 MG/ML IJ SOLN: 5.0000 mg | INTRAMUSCULAR | Status: DC | PRN

## 2022-11-16 MED ORDER — HALOPERIDOL 5 MG PO TABS
5.0000 mg | ORAL_TABLET | Freq: Four times a day (QID) | ORAL | Status: DC | PRN
  Administered 2022-11-19: 5 mg via ORAL

## 2022-11-16 NOTE — BH IP Treatment Plan (Signed)
Interdisciplinary Treatment and Diagnostic Plan Update  11/16/2022 Time of Session: 0830 Curtis Hodges MRN: [id number]  Principal Diagnosis: Schizoaffective disorder, bipolar type [facility])  Secondary Diagnoses: Principal Problem:   Schizoaffective disorder, bipolar type ([place]) Active Problems:   Hypothyroidism, unspecified   Unspecified atrial fibrillation ([place])   Personal history of pulmonary embolism   Bilateral edema of lower extremity   Chronic systolic heart failure ([place])   Bipolar 1 disorder (HCC)   Current Medications:  Current Facility-Administered Medications  Medication Dose Route Frequency Provider Last Rate Last Admin   acetaminophen (TYLENOL) tablet 650 mg  650 mg Oral Q6H PRN Caroline Sauger, NP       alum & mag hydroxide-simeth (MAALOX/MYLANTA) 200-200-20 MG/5ML suspension 30 mL  30 mL Oral Q4H PRN Caroline Sauger, NP       haloperidol (HALDOL) tablet 5 mg  5 mg Oral Q6H PRN Caroline Sauger, NP       And   benztropine (COGENTIN) tablet 1 mg  1 mg Oral Q6H PRN Caroline Sauger, NP       busPIRone (BUSPAR) tablet 15 mg  15 mg Oral BID Clapacs, Madie Reno, MD   15 mg at 11/16/22 0751   diazepam (VALIUM) injection 5 mg  5 mg Intramuscular Q4H PRN Clapacs, Madie Reno, MD       digoxin (LANOXIN) tablet 250 mcg  250 mcg Oral Daily Caroline Sauger, NP   250 mcg at 11/16/22 [id number]   diphenhydrAMINE (BENADRYL) capsule 50 mg  50 mg Oral Q6H PRN Clapacs, Madie Reno, MD       Or   diphenhydrAMINE (BENADRYL) injection 50 mg  50 mg Intramuscular Q6H PRN Clapacs, Madie Reno, MD       haloperidol (HALDOL) tablet 5 mg  5 mg Oral Q6H PRN Clapacs, Madie Reno, MD       Or   haloperidol lactate (HALDOL) injection 5 mg  5 mg Intramuscular Q6H PRN Clapacs, Madie Reno, MD       levothyroxine (SYNTHROID) tablet 75 mcg  75 mcg Oral Q0600 Caroline Sauger, NP       risperiDONE (RISPERDAL M-TABS) disintegrating tablet 2 mg  2 mg Oral Q8H PRN Caroline Sauger, NP   2 mg at 11/15/22 2102   And    LORazepam (ATIVAN) tablet 1 mg  1 mg Oral PRN Caroline Sauger, NP       risperiDONE (RISPERDAL M-TABS) disintegrating tablet 2 mg  2 mg Oral Q8H PRN Caroline Sauger, NP       And   LORazepam (ATIVAN) tablet 1 mg  1 mg Oral PRN Caroline Sauger, NP       LORazepam (ATIVAN) tablet 2 mg  2 mg Oral Q4H PRN Clapacs, Madie Reno, MD       magnesium hydroxide (MILK OF MAGNESIA) suspension 30 mL  30 mL Oral Daily PRN Caroline Sauger, NP       metoprolol succinate (TOPROL-XL) 24 hr tablet 50 mg  50 mg Oral QHS Caroline Sauger, NP   50 mg at 11/15/22 2101   midodrine (PROAMATINE) tablet 5 mg  5 mg Oral BID WC Caroline Sauger, NP   5 mg at 11/16/22 0752   mirtazapine (REMERON) tablet 15 mg  15 mg Oral QHS Clapacs, John T, MD   15 mg at 11/15/22 2101   Oxcarbazepine (TRILEPTAL) tablet 600 mg  600 mg Oral BID Caroline Sauger, NP   600 mg at 11/16/22 0752   QUEtiapine (SEROQUEL) tablet 100 mg  100 mg Oral QHS Clapacs,  Madie Reno, MD   100 mg at 11/15/22 2100   ramelteon (ROZEREM) tablet 8 mg  8 mg Oral QHS Caroline Sauger, NP   8 mg at 11/15/22 2101   risperiDONE (RISPERDAL M-TABS) disintegrating tablet 1.5 mg  1.5 mg Oral QHS Clapacs, Madie Reno, MD       rivaroxaban Alveda Reasons) tablet 20 mg  20 mg Oral q morning Caroline Sauger, NP   20 mg at 11/16/22 [id number]   PTA Medications: Medications Prior to Admission  Medication Sig Dispense Refill Last Dose   busPIRone (BUSPAR) 15 MG tablet Take 30 mg by mouth 2 (two) times daily.      digoxin (LANOXIN) 0.25 MG tablet Take 250 mcg by mouth daily.      levothyroxine (SYNTHROID) 75 MCG tablet Take 1 tablet (75 mcg total) by mouth daily. 90 tablet 3    metoprolol succinate (TOPROL-XL) 50 MG 24 hr tablet Take 50 mg by mouth at bedtime.      midodrine (PROAMATINE) 5 MG tablet Take 5 mg by mouth 2 (two) times daily with a meal.      mirtazapine (REMERON) 30 MG tablet Take 30 mg by mouth at bedtime.      mupirocin ointment (BACTROBAN) 2 %  Apply 1 Application topically 2 (two) times daily. 22 g 0    oxcarbazepine (TRILEPTAL) 600 MG tablet Take 600 mg by mouth 2 (two) times daily.      ramelteon (ROZEREM) 8 MG tablet Take 8 mg by mouth at bedtime.      risperiDONE (RISPERDAL M-TABS) 0.5 MG disintegrating tablet Take 0.5 mg by mouth daily.      XARELTO 20 MG TABS tablet Take 20 mg by mouth every morning.       Patient Stressors: Medication change or noncompliance   Traumatic event    Patient Strengths: Motivation for treatment/growth  Supportive family/friends   Treatment Modalities: Medication Management, Group therapy, Case management,  1 to 1 session with clinician, Psychoeducation, Recreational therapy.   Physician Treatment Plan for Primary Diagnosis: Schizoaffective disorder, bipolar type ([place]) Long Term Goal(s): Improvement in symptoms so as ready for discharge   Short Term Goals: Ability to maintain clinical measurements within normal limits will improve Compliance with prescribed medications will improve Ability to verbalize feelings will improve Ability to demonstrate self-control will improve Ability to identify and develop effective coping behaviors will improve  Medication Management: Evaluate patient's response, side effects, and tolerance of medication regimen.  Therapeutic Interventions: 1 to 1 sessions, Unit Group sessions and Medication administration.  Evaluation of Outcomes: Progressing  Physician Treatment Plan for Secondary Diagnosis: Principal Problem:   Schizoaffective disorder, bipolar type ([place]) Active Problems:   Hypothyroidism, unspecified   Unspecified atrial fibrillation ([place])   Personal history of pulmonary embolism   Bilateral edema of lower extremity   Chronic systolic heart failure ([place])   Bipolar 1 disorder (HCC)  Long Term Goal(s): Improvement in symptoms so as ready for discharge   Short Term Goals: Ability to maintain clinical measurements within normal limits will  improve Compliance with prescribed medications will improve Ability to verbalize feelings will improve Ability to demonstrate self-control will improve Ability to identify and develop effective coping behaviors will improve     Medication Management: Evaluate patient's response, side effects, and tolerance of medication regimen.  Therapeutic Interventions: 1 to 1 sessions, Unit Group sessions and Medication administration.  Evaluation of Outcomes: Progressing   RN Treatment Plan for Primary Diagnosis: Schizoaffective disorder, bipolar type ([place]) Long Term Goal(s):  Knowledge of disease and therapeutic regimen to maintain health will improve  Short Term Goals: Ability to remain free from injury will improve, Ability to verbalize frustration and anger appropriately will improve, Ability to demonstrate self-control, Ability to participate in decision making will improve, Ability to verbalize feelings will improve, Ability to disclose and discuss suicidal ideas, Ability to identify and develop effective coping behaviors will improve, and Compliance with prescribed medications will improve  Medication Management: RN will administer medications as ordered by provider, will assess and evaluate patient's response and provide education to patient for prescribed medication. RN will report any adverse and/or side effects to prescribing provider.  Therapeutic Interventions: 1 on 1 counseling sessions, Psychoeducation, Medication administration, Evaluate responses to treatment, Monitor vital signs and CBGs as ordered, Perform/monitor CIWA, COWS, AIMS and Fall Risk screenings as ordered, Perform wound care treatments as ordered.  Evaluation of Outcomes: Progressing   LCSW Treatment Plan for Primary Diagnosis: Schizoaffective disorder, bipolar type ([place]) Long Term Goal(s): Safe transition to appropriate next level of care at discharge, Engage patient in therapeutic group addressing interpersonal  concerns.  Short Term Goals: Engage patient in aftercare planning with referrals and resources, Increase social support, Increase ability to appropriately verbalize feelings, Increase emotional regulation, Facilitate acceptance of mental health diagnosis and concerns, Facilitate patient progression through stages of change regarding substance use diagnoses and concerns, Identify triggers associated with mental health/substance abuse issues, and Increase skills for wellness and recovery  Therapeutic Interventions: Assess for all discharge needs, 1 to 1 time with Social worker, Explore available resources and support systems, Assess for adequacy in community support network, Educate family and significant other(s) on suicide prevention, Complete Psychosocial Assessment, Interpersonal group therapy.  Evaluation of Outcomes: Progressing   Progress in Treatment: Attending groups: No. Participating in groups: No. Taking medication as prescribed: Yes. Toleration medication: Yes. Family/Significant other contact made: Yes, individual(s) contacted:  Merrilee Seashore, mother, [phone] Patient understands diagnosis: No. Discussing patient identified problems/goals with staff: Yes. Medical problems stabilized or resolved: Yes. Denies suicidal/homicidal ideation: Yes. Issues/concerns per patient self-inventory: Yes. Other: none  New problem(s) identified: No, Describe:  none  New Short Term/Long Term Goal(s): Patient to work towards detox, elimination of symptoms of psychosis, medication management for mood stabilization; elimination of SI thoughts; development of comprehensive mental wellness/sobriety plan.   Patient Goals: Patient states their goal for treatment is to "I can not control my image and brand."  Discharge Plan or Barriers: No psychosocial barriers identified at this time, patient to return to place of residence when appropriate for discharge.    Reason for Continuation of  Hospitalization: Medication stabilization; Psychosis   Estimated Length of Stay: 1-7 days   Last 3 [place] Suicide Severity Risk Score: Flowsheet Row Admission (Current) from 11/15/2022 in [place] ED from 11/14/2022 in [facility] at [facility] ED from 08/19/2022 in [facility] [place] No Risk No Risk No Risk       Scribe for Treatment Team: Larose Kells 11/16/2022 11:20 AM

## 2022-11-16 NOTE — Group Note (Signed)
Recreation Therapy Group Note   Group Topic:Coping Skills  Group Date: 11/16/2022 Start Time: 1000 End Time: 1045 Facilitators: Vilma Prader, LRT, CTRS Location:  Craft Room  Group Description: Mind Map.  Patient was provided a blank template of a diagram with 32 blank boxes in a tiered system, branching from the center (similar to a bubble chart). LRT directed patients to label the middle of the diagram "Coping Skills". LRT and patients then came up with 8 different coping skills as examples. Pt were directed to record their coping skills in the 2nd tier boxes closest to the center.  Patients would then share their coping skills with the group as LRT wrote them out. LRT gave a handout of 100 different coping skills at the end of group.   Affect/Mood: N/A   Participation Level: Did not attend    Clinical Observations/Individualized Feedback: Rickardo did not attend group due to resting in his room.  Plan: Continue to engage patient in RT group sessions 2-3x/week.   Vilma Prader, LRT, CTRS 11/16/2022 11:22 AM

## 2022-11-16 NOTE — Progress Notes (Signed)
[facility] MD Progress Note  11/16/2022 1:41 PM ERASTO KUEHLER  MRN:  [id number] Subjective: Follow-up for this 52 year old man with schizoaffective disorder.  Patient seen today in treatment team.  Patient came to treatment team but was unable to participate appropriately.  He appeared paranoid from the beginning and was talking about things that were irrelevant to the situation.  After a couple of sentences that really made no sense he got up out of his chair and rushed out of the room.  Later in the morning he was sleeping.  Overnight patient was reported to have been out of his room without clothing on.  Agitated and noisy and confused.  Talking to himself about needing to leave the hospital immediately but unable to cooperate with appropriate conversation.  Vital signs slightly elevated blood pressure otherwise unremarkable.  Multiple lab studies have been ordered none of which seem to have been done. Principal Problem: Schizoaffective disorder, bipolar type (HCC) Diagnosis: Principal Problem:   Schizoaffective disorder, bipolar type ([place]) Active Problems:   Hypothyroidism, unspecified   Unspecified atrial fibrillation ([place])   Personal history of pulmonary embolism   Bilateral edema of lower extremity   Chronic systolic heart failure (HCC)   Bipolar 1 disorder (HCC)  Total Time spent with patient: 30 minutes  Past Psychiatric History: Patient has a history of a diagnosis of bipolar disorder from what I can tell although the presentation and history to me seem more consistent with schizoaffective disorder and that appears that he has chronic poor functioning even when not considered actually symptomatic  Past Medical History:  Past Medical History:  Diagnosis Date   Atrial fibrillation ([place])    Hypertension    Thyroid disease     Past Surgical History:  Procedure Laterality Date   CHOLECYSTECTOMY     GALLBLADDER SURGERY     Family History:  Family History  Problem Relation Age of Onset    COPD Mother    Atrial fibrillation Mother    Brain cancer Father    Family Psychiatric  History: See previous Social History:  Social History   Substance and Sexual Activity  Alcohol Use Not Currently     Social History   Substance and Sexual Activity  Drug Use Never    Social History   Socioeconomic History   Marital status: Single    Spouse name: Not on file   Number of children: Not on file   Years of education: Not on file   Highest education level: Not on file  Occupational History   Not on file  Tobacco Use   Smoking status: Never   Smokeless tobacco: Never  Vaping Use   Vaping Use: Never used  Substance and Sexual Activity   Alcohol use: Not Currently   Drug use: Never   Sexual activity: Not Currently  Other Topics Concern   Not on file  Social History Narrative   Not on file   Social Determinants of Health   Financial Resource Strain: Low Risk  (07/23/2022)   Overall Financial Resource Strain (CARDIA)    Difficulty of Paying Living Expenses: Not hard at all  Food Insecurity: No Food Insecurity (11/15/2022)   Hunger Vital Sign    Worried About Running Out of Food in the Last Year: Never true    Ran Out of Food in the Last Year: Never true  Transportation Needs: No Transportation Needs (11/15/2022)   PRAPARE - Transportation    Lack of Transportation (Medical): No    Lack of  Transportation (Non-Medical): No  Physical Activity: Insufficiently Active (07/23/2022)   Exercise Vital Sign    Days of Exercise per Week: 5 days    Minutes of Exercise per Session: 10 min  Stress: No Stress Concern Present (07/23/2022)   [place]    Feeling of Stress : Only a little  Social Connections: Socially Isolated (07/23/2022)   Social Connection and Isolation Panel [NHANES]    Frequency of Communication with Friends and Family: Once a week    Frequency of Social Gatherings with Friends and Family: Once a  week    Attends Religious Services: Never    Marine scientist or Organizations: No    Attends Music therapist: 1 to 4 times per year    Marital Status: Never married   Additional Social History:                         Sleep: Poor  Appetite:  Fair  Current Medications: Current Facility-Administered Medications  Medication Dose Route Frequency Provider Last Rate Last Admin   acetaminophen (TYLENOL) tablet 650 mg  650 mg Oral Q6H PRN Caroline Sauger, NP       alum & mag hydroxide-simeth (MAALOX/MYLANTA) 200-200-20 MG/5ML suspension 30 mL  30 mL Oral Q4H PRN Caroline Sauger, NP       haloperidol (HALDOL) tablet 5 mg  5 mg Oral Q6H PRN Caroline Sauger, NP       And   benztropine (COGENTIN) tablet 1 mg  1 mg Oral Q6H PRN Caroline Sauger, NP       busPIRone (BUSPAR) tablet 15 mg  15 mg Oral BID Konstantin Lehnen, Madie Reno, MD   15 mg at 11/16/22 0751   diazepam (VALIUM) injection 5 mg  5 mg Intramuscular Q4H PRN Janece Laidlaw, Madie Reno, MD       digoxin (LANOXIN) tablet 250 mcg  250 mcg Oral Daily Caroline Sauger, NP   250 mcg at 11/16/22 [id number]   diphenhydrAMINE (BENADRYL) capsule 50 mg  50 mg Oral Q6H PRN Andrej Spagnoli, Madie Reno, MD       Or   diphenhydrAMINE (BENADRYL) injection 50 mg  50 mg Intramuscular Q6H PRN Osamu Olguin T, MD       haloperidol (HALDOL) tablet 5 mg  5 mg Oral Q6H PRN Gladys Deckard, Madie Reno, MD       Or   haloperidol lactate (HALDOL) injection 5 mg  5 mg Intramuscular Q6H PRN Tishana Clinkenbeard, Madie Reno, MD       levothyroxine (SYNTHROID) tablet 75 mcg  75 mcg Oral Q0600 Caroline Sauger, NP       risperiDONE (RISPERDAL M-TABS) disintegrating tablet 2 mg  2 mg Oral Q8H PRN Caroline Sauger, NP   2 mg at 11/15/22 2102   And   LORazepam (ATIVAN) tablet 1 mg  1 mg Oral PRN Caroline Sauger, NP       risperiDONE (RISPERDAL M-TABS) disintegrating tablet 2 mg  2 mg Oral Q8H PRN Caroline Sauger, NP       And   LORazepam (ATIVAN) tablet 1 mg  1 mg  Oral PRN Caroline Sauger, NP       LORazepam (ATIVAN) tablet 2 mg  2 mg Oral Q4H PRN Cadence Haslam T, MD       magnesium hydroxide (MILK OF MAGNESIA) suspension 30 mL  30 mL Oral Daily PRN Caroline Sauger, NP       metoprolol succinate (TOPROL-XL) 24 hr  tablet 50 mg  50 mg Oral QHS Caroline Sauger, NP   50 mg at 11/15/22 2101   midodrine (PROAMATINE) tablet 5 mg  5 mg Oral BID WC Caroline Sauger, NP   5 mg at 11/16/22 [id number]   Oxcarbazepine (TRILEPTAL) tablet 600 mg  600 mg Oral BID Caroline Sauger, NP   600 mg at 11/16/22 [id number]   QUEtiapine (SEROQUEL) tablet 200 mg  200 mg Oral QHS Kohen Reither, Madie Reno, MD       ramelteon (ROZEREM) tablet 8 mg  8 mg Oral QHS Caroline Sauger, NP   8 mg at 11/15/22 2101   risperiDONE (RISPERDAL M-TABS) disintegrating tablet 1.5 mg  1.5 mg Oral QHS Dotty Gonzalo, Madie Reno, MD       rivaroxaban Alveda Reasons) tablet 20 mg  20 mg Oral q morning Caroline Sauger, NP   20 mg at 11/16/22 0951   temazepam (RESTORIL) capsule 15 mg  15 mg Oral QHS PRN Zacharias Ridling, Madie Reno, MD        Lab Results:  Results for orders placed or performed during the hospital encounter of 11/14/22 (from the past 48 hour(s))  Comprehensive metabolic panel     Status: Abnormal   Collection Time: 11/14/22  4:23 PM  Result Value Ref Range   Sodium 136 135 - 145 mmol/L   Potassium 4.2 3.5 - 5.1 mmol/L   Chloride 104 98 - 111 mmol/L   CO2 24 22 - 32 mmol/L   Glucose, Bld 124 (H) 70 - 99 mg/dL    Comment: Glucose reference range applies only to samples taken after fasting for at least 8 hours.   BUN 20 6 - 20 mg/dL   Creatinine, Ser 0.92 0.61 - 1.24 mg/dL   Calcium 9.7 8.9 - 10.3 mg/dL   Total Protein 8.0 6.5 - 8.1 g/dL   Albumin 4.4 3.5 - 5.0 g/dL   AST 26 15 - 41 U/L   ALT 22 0 - 44 U/L   Alkaline Phosphatase 114 38 - 126 U/L   Total Bilirubin 0.5 0.3 - 1.2 mg/dL   GFR, Estimated >60 >60 mL/min    Comment: (NOTE) Calculated using the CKD-EPI Creatinine Equation (2021)     Anion gap 8 5 - 15    Comment: Performed at [facility] [address] [place], [place] [address]  Ethanol     Status: None   Collection Time: 11/14/22  4:23 PM  Result Value Ref Range   Alcohol, Ethyl (B) <10 <10 mg/dL    Comment: (NOTE) Lowest detectable limit for serum alcohol is 10 mg/dL.  For medical purposes only. Performed at [facility], [address] [place], [place] [id number]   Salicylate level     Status: Abnormal   Collection Time: 11/14/22  4:23 PM  Result Value Ref Range   Salicylate Lvl [id number] (L) 7.0 - 30.0 mg/dL    Comment: Performed at [facility], [address] [place], [place] [address]  Acetaminophen level     Status: Abnormal   Collection Time: 11/14/22  4:23 PM  Result Value Ref Range   Acetaminophen (Tylenol), Serum <10 (L) 10 - 30 ug/mL    Comment: (NOTE) Therapeutic concentrations vary significantly. A range of 10-30 ug/mL  may be an effective concentration for many patients. However, some  are best treated at concentrations outside of this range. Acetaminophen concentrations >150 ug/mL at 4 hours after ingestion  and >50 ug/mL at 12 hours after ingestion are often associated with  toxic  reactions.  Performed at [facility], [address] [place], [place] [address]   cbc     Status: None   Collection Time: 11/14/22  4:23 PM  Result Value Ref Range   WBC 8.0 4.0 - 10.5 K/uL   RBC 4.39 4.22 - 5.81 MIL/uL   Hemoglobin 14.1 13.0 - 17.0 g/dL   HCT 42.5 39.0 - 52.0 %   MCV 96.8 80.0 - 100.0 fL   MCH 32.1 26.0 - 34.0 pg   MCHC 33.2 30.0 - 36.0 g/dL   RDW 13.2 11.5 - 15.5 %   Platelets 321 150 - 400 K/uL   nRBC 0.0 0.0 - 0.2 %    Comment: Performed at [facility], [address] [place], [place] [address]  Urine Drug Screen, Qualitative     Status: None   Collection Time: 11/14/22  4:23 PM  Result Value Ref Range   Tricyclic, Ur Screen NONE DETECTED NONE DETECTED   Amphetamines, Ur  Screen NONE DETECTED NONE DETECTED   MDMA (Ecstasy)Ur Screen NONE DETECTED NONE DETECTED   Cocaine Metabolite,Ur [place] NONE DETECTED NONE DETECTED   Opiate, Ur Screen NONE DETECTED NONE DETECTED   Phencyclidine (PCP) Ur S NONE DETECTED NONE DETECTED   Cannabinoid 50 Ng, Ur [place] NONE DETECTED NONE DETECTED   Barbiturates, Ur Screen NONE DETECTED NONE DETECTED   Benzodiazepine, Ur Scrn NONE DETECTED NONE DETECTED   Methadone Scn, Ur NONE DETECTED NONE DETECTED    Comment: (NOTE) Tricyclics + metabolites, urine    Cutoff 1000 ng/mL Amphetamines + metabolites, urine  Cutoff 1000 ng/mL MDMA (Ecstasy), urine              Cutoff 500 ng/mL Cocaine Metabolite, urine          Cutoff 300 ng/mL Opiate + metabolites, urine        Cutoff 300 ng/mL Phencyclidine (PCP), urine         Cutoff 25 ng/mL Cannabinoid, urine                 Cutoff 50 ng/mL Barbiturates + metabolites, urine  Cutoff 200 ng/mL Benzodiazepine, urine              Cutoff 200 ng/mL Methadone, urine                   Cutoff 300 ng/mL  The urine drug screen provides only a preliminary, unconfirmed analytical test result and should not be used for non-medical purposes. Clinical consideration and professional judgment should be applied to any positive drug screen result due to possible interfering substances. A more specific alternate chemical method must be used in order to obtain a confirmed analytical result. Gas chromatography / mass spectrometry (GC/MS) is the preferred confirm atory method. Performed at [facility], [address] [place], [place] [address]   Resp panel by RT-PCR (RSV, Flu A&B, Covid) Anterior Nasal Swab     Status: None   Collection Time: 11/15/22 12:50 AM   Specimen: Anterior Nasal Swab  Result Value Ref Range   SARS Coronavirus 2 by RT PCR NEGATIVE NEGATIVE    Comment: (NOTE) SARS-CoV-2 target nucleic acids are NOT DETECTED.  The SARS-CoV-2 RNA is generally detectable in upper  respiratory specimens during the acute phase of infection. The lowest concentration of SARS-CoV-2 viral copies this assay can detect is 138 copies/mL. A negative result does not preclude SARS-Cov-2 infection and should not be used as the sole basis for treatment or other patient management  decisions. A negative result may occur with  improper specimen collection/handling, submission of specimen other than nasopharyngeal swab, presence of viral mutation(s) within the areas targeted by this assay, and inadequate number of viral copies(<138 copies/mL). A negative result must be combined with clinical observations, patient history, and epidemiological information. The expected result is Negative.  Fact Sheet for Patients:  EntrepreneurPulse.com.au  Fact Sheet for Healthcare Providers:  IncredibleEmployment.be  This test is no t yet approved or cleared by the [place] FDA and  has been authorized for detection and/or diagnosis of SARS-CoV-2 by FDA under an Emergency Use Authorization (EUA). This EUA will remain  in effect (meaning this test can be used) for the duration of the COVID-19 declaration under Section 564(b)(1) of the Act, 21 U.S.C.section 360bbb-3(b)(1), unless the authorization is terminated  or revoked sooner.       Influenza A by PCR NEGATIVE NEGATIVE   Influenza B by PCR NEGATIVE NEGATIVE    Comment: (NOTE) The Xpert Xpress SARS-CoV-2/FLU/RSV plus assay is intended as an aid in the diagnosis of influenza from Nasopharyngeal swab specimens and should not be used as a sole basis for treatment. Nasal washings and aspirates are unacceptable for Xpert Xpress SARS-CoV-2/FLU/RSV testing.  Fact Sheet for Patients: EntrepreneurPulse.com.au  Fact Sheet for Healthcare Providers: IncredibleEmployment.be  This test is not yet approved or cleared by the [place] FDA and has been authorized for  detection and/or diagnosis of SARS-CoV-2 by FDA under an Emergency Use Authorization (EUA). This EUA will remain in effect (meaning this test can be used) for the duration of the COVID-19 declaration under Section 564(b)(1) of the Act, 21 U.S.C. section 360bbb-3(b)(1), unless the authorization is terminated or revoked.     Resp Syncytial Virus by PCR NEGATIVE NEGATIVE    Comment: (NOTE) Fact Sheet for Patients: EntrepreneurPulse.com.au  Fact Sheet for Healthcare Providers: IncredibleEmployment.be  This test is not yet approved or cleared by the [place] FDA and has been authorized for detection and/or diagnosis of SARS-CoV-2 by FDA under an Emergency Use Authorization (EUA). This EUA will remain in effect (meaning this test can be used) for the duration of the COVID-19 declaration under Section 564(b)(1) of the Act, 21 U.S.C. section 360bbb-3(b)(1), unless the authorization is terminated or revoked.  Performed at [facility], [address] [place], [place] [address]     Blood Alcohol level:  Lab Results  Component Value Date   [facility] <10 [id number]    Metabolic Disorder Labs: Lab Results  Component Value Date   HGBA1C 5.7 (H) 07/23/2022   MPG 117 07/23/2022   No results found for: "PROLACTIN" Lab Results  Component Value Date   CHOL 155 07/23/2022   TRIG 67 07/23/2022   HDL 53 07/23/2022   CHOLHDL 2.9 07/23/2022   LDLCALC 87 07/23/2022    Physical Findings: AIMS:  , ,  ,  ,    CIWA:    COWS:     Musculoskeletal: Strength & Muscle Tone: within normal limits Gait & Station: normal Patient leans: N/A  Psychiatric Specialty Exam:  Presentation  General Appearance:  Bizarre  Eye Contact: Minimal  Speech: Pressured; Clear and Coherent  Speech Volume: Increased  Handedness: Right   Mood and Affect  Mood: Angry; Irritable  Affect: Inappropriate; Full Range   Thought Process  Thought  Processes: Coherent  Descriptions of Associations:Circumstantial  Orientation:Full (Time, Place and Person)  Thought Content:Logical; Obsessions  History of Schizophrenia/Schizoaffective disorder:No  Duration of Psychotic Symptoms:Less than six months  Hallucinations:No  data recorded Ideas of Reference:None  Suicidal Thoughts:No data recorded Homicidal Thoughts:No data recorded  Sensorium  Memory: Immediate Good; Recent Good; Remote Good  Judgment: Poor  Insight: Fair   Community education officer  Concentration: Poor  Attention Span: Poor  Recall: Good  Fund of Knowledge: Good  Language: Good   Psychomotor Activity  Psychomotor Activity:No data recorded  Assets  Assets: Communication Skills; Desire for Improvement; Social Support; Resilience; Physical Health   Sleep  Sleep:No data recorded   Physical Exam: Physical Exam Vitals and nursing note reviewed.  Constitutional:      General: He is in acute distress.     Appearance: Normal appearance.  HENT:     Head: Normocephalic and atraumatic.     Mouth/Throat:     Pharynx: Oropharynx is clear.  Eyes:     Pupils: Pupils are equal, round, and reactive to light.  Cardiovascular:     Rate and Rhythm: Normal rate and regular rhythm.  Pulmonary:     Effort: Pulmonary effort is normal.     Breath sounds: Normal breath sounds.  Abdominal:     General: Abdomen is flat.     Palpations: Abdomen is soft.  Musculoskeletal:        General: Normal range of motion.  Skin:    General: Skin is warm and dry.  Neurological:     General: No focal deficit present.     Mental Status: He is alert. Mental status is at baseline.  Psychiatric:        Attention and Perception: He is inattentive.        Mood and Affect: Affect is labile and inappropriate.        Speech: He is noncommunicative. Speech is rapid and pressured and tangential.        Behavior: Behavior is agitated.        Thought Content: Thought content  is delusional.        Cognition and Memory: Cognition is impaired. Memory is impaired.        Judgment: Judgment is inappropriate.    Review of Systems  Unable to perform ROS: Psychiatric disorder   Blood pressure (!) 158/84, pulse 70, temperature 98.4 F (36.9 C), temperature source Oral, resp. rate 18, height '6\' 5"'$  (1.956 m), weight 102.1 kg, SpO2 97 %. Body mass index is 26.68 kg/m.   Treatment Plan Summary: Daily contact with patient to assess and evaluate symptoms and progress in treatment, Medication management, and Plan after the agitation last night and this morning allowed patient to sleep.  Currently he is clearly still psychotic and if anything appears worse than when he first came in.  Reviewed laboratories and reviewed the orders.  I have placed additional as needed orders for haloperidol benzodiazepines and Benadryl as needed for agitation.  I will increase tonight's dose of Seroquel.  I have added as needed Restoril specifically if he needs it to sleep at night.  I have requested that the labs and EKG that were ordered to be completed.  Continue current level of check staff aware of his clinical condition.  Alethia Berthold, MD 11/16/2022, 1:41 PM

## 2022-11-16 NOTE — Progress Notes (Signed)
D- Patient alert and disoriented. Affect blank/mood disorganized and bizarre. Denies SI/ HI/ AVH.  A- Scheduled medications administered to patient, per MD orders. PRN Ativan 2 mg administered for anxiety and outburst- effective. He watching TV now. Support and encouragement provided.  Routine safety checks conducted every 15 minutes.  Patient informed to notify staff with problems or concerns. R- No adverse drug reactions noted. Patient compliant with medications and treatment plan. Patient remains safe at this time.

## 2022-11-16 NOTE — Group Note (Signed)
[facility] LCSW Group Therapy Note   Group Date: 11/16/2022 Start Time: 1300 End Time: 1400   Type of Therapy/Topic:  Group Therapy:  Balance in Life  Participation Level:  Did Not Attend   Description of Group:    This group will address the concept of balance and how it feels and looks when one is unbalanced. Patients will be encouraged to process areas in their lives that are out of balance, and identify reasons for remaining unbalanced. Facilitators will guide patients utilizing problem- solving interventions to address and correct the stressor making their life unbalanced. Understanding and applying boundaries will be explored and addressed for obtaining  and maintaining a balanced life. Patients will be encouraged to explore ways to assertively make their unbalanced needs known to significant others in their lives, using other group members and facilitator for support and feedback.  Therapeutic Goals: Patient will identify two or more emotions or situations they have that consume much of in their lives. Patient will identify signs/triggers that life has become out of balance:  Patient will identify two ways to set boundaries in order to achieve balance in their lives:  Patient will demonstrate ability to communicate their needs through discussion and/or role plays  Summary of Patient Progress:    X    Therapeutic Modalities:   Cognitive Behavioral Therapy Solution-Focused Therapy Assertiveness Training   Rozann Lesches, LCSW

## 2022-11-16 NOTE — Progress Notes (Signed)
D- Patient alert and oriented. Affect/mood. Denies SI, HI, AVH, and pain. Quotes. Goal. How did they do with achieving previous goal. A- Scheduled medications administered to patient, per MD orders. Support and encouragement provided.  Routine safety checks conducted every 15 minutes.  Patient informed to notify staff with problems or concerns. R- No adverse drug reactions noted. Patient contracts for safety at this time. Patient compliant with medications and treatment plan. Patient receptive, calm, and cooperative. Patient interacts well with others on the unit.  Patient remains safe at this time.

## 2022-11-17 DIAGNOSIS — F250 Schizoaffective disorder, bipolar type: Secondary | ICD-10-CM | POA: Diagnosis not present

## 2022-11-17 LAB — LIPID PANEL
Cholesterol: 129 mg/dL (ref 0–200)
HDL: 65 mg/dL (ref >40)
LDL Cholesterol: 48 mg/dL (ref 0–99)
Total CHOL/HDL Ratio: 2 RATIO
Triglycerides: 81 mg/dL (ref <150)
VLDL: 16 mg/dL (ref 0–40)

## 2022-11-17 LAB — TSH: TSH: 3.769 u[IU]/mL (ref 0.350–4.500)

## 2022-11-17 MED ORDER — QUETIAPINE FUMARATE 200 MG PO TABS
300.0000 mg | ORAL_TABLET | Freq: Every day | ORAL | Status: DC
  Administered 2022-11-17: 300 mg via ORAL
  Filled 2022-11-17: qty 1

## 2022-11-17 NOTE — BHH Counselor (Signed)
CSW spoke with pt regarding calling his job to inform them he will be out for a few days. Pt shares he does not remember is EID because he has "schizophrenic brain." Pt shares this number is on his phone that his sister or mother currently have.  CSW called pt's sister who shares that she was already able to call pt's employer to inform them. She shared that they will need paperwork completed for him to return. Pt's sister provided CSW with pt's WIN/EID: [id number]. CSW attempted to call pt's employer to work on obtaining needed paperwork however, office that handles this matter is closed on weekends. Will need to call during M-F business hours at [phone].     Darletta Moll MSW, LCSW Geneticist, molecular

## 2022-11-17 NOTE — Progress Notes (Signed)
D: Patient denies SI/HI/AVH, affect is blunted he was irritable and angry this shift, he appears anxious and he is not interacting with peers and staff appropriately.  A: Patient  was offered support and encouragement. Pt was given scheduled medications. Pt was encouraged to attend groups. 15 minute checks were done for safety.  R:Patient did not attend groups with peers and staff. Pt is complaint medication. Pt has no complaints.Pt receptive to treatment and safety maintained on unit.

## 2022-11-17 NOTE — B[facility] Group Notes (Signed)
[place] Group Notes:  (Nursing/MHT/Case Management/Adjunct)  Date:  11/17/2022  Time:  2:34 PM  Type of Therapy:   NA/AA  Participation Level:  Did Not Attend  [facility] 11/17/2022, 2:34 PM

## 2022-11-17 NOTE — Progress Notes (Signed)
[facility] MD Progress Note  11/17/2022 2:06 PM Curtis Hodges  MRN:  [id number] Subjective: Follow-up with this 52 year old gentleman who has what I believe is schizoaffective disorder.  Patient continues to be agitated at times.  Will talk rapidly paced rapidly he seems paranoid.  He wanted to speak with me today and then after only a couple of minutes suddenly bolted up saying some things that did not make any sense and ran out of the room.  Has not been violent towards anyone.  Continues to talk about how he needs to get "an Charisse March" to get out of the hospital. Principal Problem: Schizoaffective disorder, bipolar type ([place]) Diagnosis: Principal Problem:   Schizoaffective disorder, bipolar type ([place]) Active Problems:   Hypothyroidism, unspecified   Unspecified atrial fibrillation ([place])   Personal history of pulmonary embolism   Bilateral edema of lower extremity   Chronic systolic heart failure ([place])   Bipolar 1 disorder ([place])  Total Time spent with patient: 30 minutes  Past Psychiatric History: Past history of diagnosis of bipolar disorder  Past Medical History:  Past Medical History:  Diagnosis Date   Atrial fibrillation ([place])    Hypertension    Thyroid disease     Past Surgical History:  Procedure Laterality Date   CHOLECYSTECTOMY     GALLBLADDER SURGERY     Family History:  Family History  Problem Relation Age of Onset   COPD Mother    Atrial fibrillation Mother    Brain cancer Father    Family Psychiatric  History: See previous Social History:  Social History   Substance and Sexual Activity  Alcohol Use Not Currently     Social History   Substance and Sexual Activity  Drug Use Never    Social History   Socioeconomic History   Marital status: Single    Spouse name: Not on file   Number of children: Not on file   Years of education: Not on file   Highest education level: Not on file  Occupational History   Not on file  Tobacco Use   Smoking status: Never    Smokeless tobacco: Never  Vaping Use   Vaping Use: Never used  Substance and Sexual Activity   Alcohol use: Not Currently   Drug use: Never   Sexual activity: Not Currently  Other Topics Concern   Not on file  Social History Narrative   Not on file   Social Determinants of Health   Financial Resource Strain: Low Risk  (07/23/2022)   Overall Financial Resource Strain (CARDIA)    Difficulty of Paying Living Expenses: Not hard at all  Food Insecurity: No Food Insecurity (11/15/2022)   Hunger Vital Sign    Worried About Running Out of Food in the Last Year: Never true    Ran Out of Food in the Last Year: Never true  Transportation Needs: No Transportation Needs (11/15/2022)   PRAPARE - Hydrologist (Medical): No    Lack of Transportation (Non-Medical): No  Physical Activity: Insufficiently Active (07/23/2022)   Exercise Vital Sign    Days of Exercise per Week: 5 days    Minutes of Exercise per Session: 10 min  Stress: No Stress Concern Present (07/23/2022)   [place]    Feeling of Stress : Only a little  Social Connections: Socially Isolated (07/23/2022)   Social Connection and Isolation Panel [NHANES]    Frequency of Communication with Friends and Family: Once  a week    Frequency of Social Gatherings with Friends and Family: Once a week    Attends Religious Services: Never    Marine scientist or Organizations: No    Attends Music therapist: 1 to 4 times per year    Marital Status: Never married   Additional Social History:                         Sleep: Fair  Appetite:  Fair  Current Medications: Current Facility-Administered Medications  Medication Dose Route Frequency Provider Last Rate Last Admin   acetaminophen (TYLENOL) tablet 650 mg  650 mg Oral Q6H PRN Caroline Sauger, NP       alum & mag hydroxide-simeth (MAALOX/MYLANTA) 200-200-20  MG/5ML suspension 30 mL  30 mL Oral Q4H PRN Caroline Sauger, NP       haloperidol (HALDOL) tablet 5 mg  5 mg Oral Q6H PRN Caroline Sauger, NP       And   benztropine (COGENTIN) tablet 1 mg  1 mg Oral Q6H PRN Caroline Sauger, NP       busPIRone (BUSPAR) tablet 15 mg  15 mg Oral BID Brin Ruggerio, Madie Reno, MD   15 mg at 11/17/22 0847   diazepam (VALIUM) injection 5 mg  5 mg Intramuscular Q4H PRN Pritika Alvarez, Madie Reno, MD       digoxin (LANOXIN) tablet 250 mcg  250 mcg Oral Daily Caroline Sauger, NP   250 mcg at 11/17/22 0848   diphenhydrAMINE (BENADRYL) capsule 50 mg  50 mg Oral Q6H PRN Yeshua Stryker, Madie Reno, MD       Or   diphenhydrAMINE (BENADRYL) injection 50 mg  50 mg Intramuscular Q6H PRN Tammatha Cobb T, MD       haloperidol (HALDOL) tablet 5 mg  5 mg Oral Q6H PRN Jelisa , Madie Reno, MD       Or   haloperidol lactate (HALDOL) injection 5 mg  5 mg Intramuscular Q6H PRN Vance Belcourt, Madie Reno, MD       levothyroxine (SYNTHROID) tablet 75 mcg  75 mcg Oral Q0600 Caroline Sauger, NP       risperiDONE (RISPERDAL M-TABS) disintegrating tablet 2 mg  2 mg Oral Q8H PRN Caroline Sauger, NP   2 mg at 11/17/22 1146   And   LORazepam (ATIVAN) tablet 1 mg  1 mg Oral PRN Caroline Sauger, NP       risperiDONE (RISPERDAL M-TABS) disintegrating tablet 2 mg  2 mg Oral Q8H PRN Caroline Sauger, NP       And   LORazepam (ATIVAN) tablet 1 mg  1 mg Oral PRN Caroline Sauger, NP       LORazepam (ATIVAN) tablet 2 mg  2 mg Oral Q4H PRN Angelissa Supan T, MD   2 mg at 11/16/22 1743   magnesium hydroxide (MILK OF MAGNESIA) suspension 30 mL  30 mL Oral Daily PRN Caroline Sauger, NP       metoprolol succinate (TOPROL-XL) 24 hr tablet 50 mg  50 mg Oral QHS Caroline Sauger, NP   50 mg at 11/16/22 2222   midodrine (PROAMATINE) tablet 5 mg  5 mg Oral BID WC Caroline Sauger, NP   5 mg at 11/17/22 0848   Oxcarbazepine (TRILEPTAL) tablet 600 mg  600 mg Oral BID Caroline Sauger, NP   600 mg at  11/17/22 0847   QUEtiapine (SEROQUEL) tablet 300 mg  300 mg Oral QHS Apolo Cutshaw, Madie Reno, MD  ramelteon (ROZEREM) tablet 8 mg  8 mg Oral QHS Caroline Sauger, NP   8 mg at 11/16/22 2223   risperiDONE (RISPERDAL M-TABS) disintegrating tablet 1.5 mg  1.5 mg Oral QHS Haytham Maher T, MD   1.5 mg at 11/16/22 2222   rivaroxaban (XARELTO) tablet 20 mg  20 mg Oral q morning Caroline Sauger, NP   20 mg at 11/17/22 0848   temazepam (RESTORIL) capsule 15 mg  15 mg Oral QHS PRN Quashaun Lazalde, Madie Reno, MD        Lab Results:  Results for orders placed or performed during the hospital encounter of 11/15/22 (from the past 48 hour(s))  Lipid panel     Status: None   Collection Time: 11/17/22 11:30 AM  Result Value Ref Range   Cholesterol 129 0 - 200 mg/dL   Triglycerides 81 <150 mg/dL   HDL 65 >40 mg/dL   Total CHOL/HDL Ratio 2.0 RATIO   VLDL 16 0 - 40 mg/dL   LDL Cholesterol 48 0 - 99 mg/dL    Comment:        Total Cholesterol/HDL:CHD Risk Coronary Heart Disease Risk Table                     Men   Women  1/2 Average Risk   3.4   3.3  Average Risk       5.0   4.4  2 X Average Risk   9.6   7.1  3 X Average Risk  23.4   11.0        Use the calculated Patient Ratio above and the CHD Risk Table to determine the patient's CHD Risk.        ATP III CLASSIFICATION (LDL):  <100     mg/dL   Optimal  100-129  mg/dL   Near or Above                    Optimal  130-159  mg/dL   Borderline  160-189  mg/dL   High  >190     mg/dL   Very High Performed at [facility], [address] [place], [place] [address]   TSH     Status: None   Collection Time: 11/17/22 11:30 AM  Result Value Ref Range   TSH 3.769 0.350 - 4.500 uIU/mL    Comment: Performed by a 3rd Generation assay with a functional sensitivity of <=0.01 uIU/mL. Performed at [facility], [address] [place], [place] [address]     Blood Alcohol level:  Lab Results  Component Value Date   [facility] <10 [id number]     Metabolic Disorder Labs: Lab Results  Component Value Date   HGBA1C 5.7 (H) 07/23/2022   MPG 117 07/23/2022   No results found for: "PROLACTIN" Lab Results  Component Value Date   CHOL 129 11/17/2022   TRIG 81 11/17/2022   HDL 65 11/17/2022   CHOLHDL 2.0 11/17/2022   VLDL 16 11/17/2022   LDLCALC 48 11/17/2022   LDLCALC 87 07/23/2022    Physical Findings: AIMS:  , ,  ,  ,    CIWA:    COWS:     Musculoskeletal: Strength & Muscle Tone: within normal limits Gait & Station: normal Patient leans: N/A  Psychiatric Specialty Exam:  Presentation  General Appearance:  Bizarre  Eye Contact: Minimal  Speech: Pressured; Clear and Coherent  Speech Volume: Increased  Handedness: Right   Mood and Affect  Mood: Angry;  Irritable  Affect: Inappropriate; Full Range   Thought Process  Thought Processes: Coherent  Descriptions of Associations:Circumstantial  Orientation:Full (Time, Place and Person)  Thought Content:Logical; Obsessions  History of Schizophrenia/Schizoaffective disorder:No  Duration of Psychotic Symptoms:Less than six months  Hallucinations:No data recorded Ideas of Reference:None  Suicidal Thoughts:No data recorded Homicidal Thoughts:No data recorded  Sensorium  Memory: Immediate Good; Recent Good; Remote Good  Judgment: Poor  Insight: Fair   Community education officer  Concentration: Poor  Attention Span: Poor  Recall: Good  Fund of Knowledge: Good  Language: Good   Psychomotor Activity  Psychomotor Activity:No data recorded  Assets  Assets: Communication Skills; Desire for Improvement; Social Support; Resilience; Physical Health   Sleep  Sleep:No data recorded   Physical Exam: Physical Exam Vitals and nursing note reviewed.  Constitutional:      Appearance: Normal appearance.  HENT:     Head: Normocephalic and atraumatic.     Mouth/Throat:     Pharynx: Oropharynx is clear.  Eyes:     Pupils:  Pupils are equal, round, and reactive to light.  Cardiovascular:     Rate and Rhythm: Normal rate and regular rhythm.  Pulmonary:     Effort: Pulmonary effort is normal.     Breath sounds: Normal breath sounds.  Abdominal:     General: Abdomen is flat.     Palpations: Abdomen is soft.  Musculoskeletal:        General: Normal range of motion.  Skin:    General: Skin is warm and dry.  Neurological:     General: No focal deficit present.     Mental Status: He is alert. Mental status is at baseline.  Psychiatric:        Attention and Perception: He is inattentive.        Mood and Affect: Mood normal. Affect is labile and inappropriate.        Speech: Speech is rapid and pressured and tangential.        Behavior: Behavior is agitated. Behavior is not aggressive.        Thought Content: Thought content is paranoid and delusional.        Judgment: Judgment is impulsive and inappropriate.    Review of Systems  Constitutional: Negative.   HENT: Negative.    Eyes: Negative.   Respiratory: Negative.    Cardiovascular: Negative.   Gastrointestinal: Negative.   Musculoskeletal: Negative.   Skin: Negative.   Neurological: Negative.   Psychiatric/Behavioral:  Positive for hallucinations. Negative for depression, substance abuse and suicidal ideas. The patient is nervous/anxious.    Blood pressure 131/83, pulse 91, temperature 97.8 F (36.6 C), temperature source Oral, resp. rate 18, height '6\' 5"'$  (1.956 m), weight 102.1 kg, SpO2 100 %. Body mass index is 26.68 kg/m.   Treatment Plan Summary: Medication management and Plan explained to the patient that the plan is to gradually increase Seroquel by 100 mg a day which means going up to 300 at night.  Continue as needed orders.  Encourage patient to stay calm and ask nurses for things if he needs them.  Alethia Berthold, MD 11/17/2022, 2:06 PM

## 2022-11-18 DIAGNOSIS — F250 Schizoaffective disorder, bipolar type: Secondary | ICD-10-CM | POA: Diagnosis not present

## 2022-11-18 MED ORDER — QUETIAPINE FUMARATE 200 MG PO TABS
400.0000 mg | ORAL_TABLET | Freq: Every day | ORAL | Status: DC
  Administered 2022-11-18 – 2022-11-20 (×3): 400 mg via ORAL
  Filled 2022-11-18 (×4): qty 2

## 2022-11-18 NOTE — Progress Notes (Signed)
[facility] MD Progress Note  11/18/2022 12:10 PM Curtis Hodges  MRN:  [id number] Subjective: Follow-up patient with schizoaffective disorder.  To my assessment he seems a little calmer today.  No report from nursing of any bizarre behavior overnight over this morning.  He was able to sit down for a few minutes and talk with me although he is still scattered and disorganized in his thinking.  He was at least able to have some conversation about his medicine.  I have reviewed his chart and saw that in the past he seemed to have done well on lithium for quite a while.  Patient claims that he was told not to take this by cardiology.  I have gone back through his chart and that does not actually seem to be the case.  From what I can tell it was never discontinued by medical practitioners but was discontinued during a psychiatric hospitalization because someone thought that it was not effective.  Patient remains somewhat adamant about this and we will have to readdress it tomorrow. Principal Problem: Schizoaffective disorder, bipolar type (H[facility]) Diagnosis: Principal Problem:   Schizoaffective disorder, bipolar type ([place]) Active Problems:   Hypothyroidism, unspecified   Unspecified atrial fibrillation ([place])   Personal history of pulmonary embolism   Bilateral edema of lower extremity   Chronic systolic heart failure (H[facility])   Bipolar 1 disorder ([facility])  Total Time spent with patient: 30 minutes  Past Psychiatric History: Past history of schizoaffective disorder  Past Medical History:  Past Medical History:  Diagnosis Date   Atrial fibrillation ([place])    Hypertension    Thyroid disease     Past Surgical History:  Procedure Laterality Date   CHOLECYSTECTOMY     GALLBLADDER SURGERY     Family History:  Family History  Problem Relation Age of Onset   COPD Mother    Atrial fibrillation Mother    Brain cancer Father    Family Psychiatric  History: See previous Social History:  Social History    Substance and Sexual Activity  Alcohol Use Not Currently     Social History   Substance and Sexual Activity  Drug Use Never    Social History   Socioeconomic History   Marital status: Single    Spouse name: Not on file   Number of children: Not on file   Years of education: Not on file   Highest education level: Not on file  Occupational History   Not on file  Tobacco Use   Smoking status: Never   Smokeless tobacco: Never  Vaping Use   Vaping Use: Never used  Substance and Sexual Activity   Alcohol use: Not Currently   Drug use: Never   Sexual activity: Not Currently  Other Topics Concern   Not on file  Social History Narrative   Not on file   Social Determinants of Health   Financial Resource Strain: Low Risk  (07/23/2022)   Overall Financial Resource Strain (CARDIA)    Difficulty of Paying Living Expenses: Not hard at all  Food Insecurity: No Food Insecurity (11/15/2022)   Hunger Vital Sign    Worried About Running Out of Food in the Last Year: Never true    Ran Out of Food in the Last Year: Never true  Transportation Needs: No Transportation Needs (11/15/2022)   PRAPARE - Hydrologist (Medical): No    Lack of Transportation (Non-Medical): No  Physical Activity: Insufficiently Active (07/23/2022)   Exercise Vital Sign  Days of Exercise per Week: 5 days    Minutes of Exercise per Session: 10 min  Stress: No Stress Concern Present (07/23/2022)   [place]    Feeling of Stress : Only a little  Social Connections: Socially Isolated (07/23/2022)   Social Connection and Isolation Panel [NHANES]    Frequency of Communication with Friends and Family: Once a week    Frequency of Social Gatherings with Friends and Family: Once a week    Attends Religious Services: Never    Marine scientist or Organizations: No    Attends Music therapist: 1 to 4 times per  year    Marital Status: Never married   Additional Social History:                         Sleep: Fair  Appetite:  Fair  Current Medications: Current Facility-Administered Medications  Medication Dose Route Frequency Provider Last Rate Last Admin   acetaminophen (TYLENOL) tablet 650 mg  650 mg Oral Q6H PRN Caroline Sauger, NP       alum & mag hydroxide-simeth (MAALOX/MYLANTA) 200-200-20 MG/5ML suspension 30 mL  30 mL Oral Q4H PRN Caroline Sauger, NP       haloperidol (HALDOL) tablet 5 mg  5 mg Oral Q6H PRN Caroline Sauger, NP       And   benztropine (COGENTIN) tablet 1 mg  1 mg Oral Q6H PRN Caroline Sauger, NP       busPIRone (BUSPAR) tablet 15 mg  15 mg Oral BID Rahkeem Senft, Madie Reno, MD   15 mg at 11/18/22 0834   diazepam (VALIUM) injection 5 mg  5 mg Intramuscular Q4H PRN Maddalyn Lutze, Madie Reno, MD       digoxin (LANOXIN) tablet 250 mcg  250 mcg Oral Daily Caroline Sauger, NP   250 mcg at 11/18/22 [id number]   diphenhydrAMINE (BENADRYL) capsule 50 mg  50 mg Oral Q6H PRN Haydyn Liddell, Madie Reno, MD       Or   diphenhydrAMINE (BENADRYL) injection 50 mg  50 mg Intramuscular Q6H PRN Farhaan Mabee T, MD       haloperidol (HALDOL) tablet 5 mg  5 mg Oral Q6H PRN Benny Henrie, Madie Reno, MD       Or   haloperidol lactate (HALDOL) injection 5 mg  5 mg Intramuscular Q6H PRN Silviano Neuser, Madie Reno, MD       levothyroxine (SYNTHROID) tablet 75 mcg  75 mcg Oral Q0600 Caroline Sauger, NP       risperiDONE (RISPERDAL M-TABS) disintegrating tablet 2 mg  2 mg Oral Q8H PRN Caroline Sauger, NP   2 mg at 11/17/22 1146   And   LORazepam (ATIVAN) tablet 1 mg  1 mg Oral PRN Caroline Sauger, NP       risperiDONE (RISPERDAL M-TABS) disintegrating tablet 2 mg  2 mg Oral Q8H PRN Caroline Sauger, NP       And   LORazepam (ATIVAN) tablet 1 mg  1 mg Oral PRN Caroline Sauger, NP       LORazepam (ATIVAN) tablet 2 mg  2 mg Oral Q4H PRN Kismet Facemire T, MD   2 mg at 11/16/22 1743   magnesium  hydroxide (MILK OF MAGNESIA) suspension 30 mL  30 mL Oral Daily PRN Caroline Sauger, NP       metoprolol succinate (TOPROL-XL) 24 hr tablet 50 mg  50 mg Oral QHS Caroline Sauger, NP   50  mg at 11/17/22 2118   midodrine (PROAMATINE) tablet 5 mg  5 mg Oral BID WC Caroline Sauger, NP   5 mg at 11/18/22 [id number]   Oxcarbazepine (TRILEPTAL) tablet 600 mg  600 mg Oral BID Caroline Sauger, NP   600 mg at 11/18/22 [id number]   QUEtiapine (SEROQUEL) tablet 400 mg  400 mg Oral QHS Deral Schellenberg, Madie Reno, MD       ramelteon (ROZEREM) tablet 8 mg  8 mg Oral QHS Caroline Sauger, NP   8 mg at 11/17/22 2119   risperiDONE (RISPERDAL M-TABS) disintegrating tablet 1.5 mg  1.5 mg Oral QHS Amberley Hamler T, MD   1.5 mg at 11/17/22 2119   rivaroxaban (XARELTO) tablet 20 mg  20 mg Oral q morning Caroline Sauger, NP   20 mg at 11/18/22 0835   temazepam (RESTORIL) capsule 15 mg  15 mg Oral QHS PRN Tianni Escamilla, Madie Reno, MD   15 mg at 11/17/22 2118    Lab Results:  Results for orders placed or performed during the hospital encounter of 11/15/22 (from the past 48 hour(s))  Lipid panel     Status: None   Collection Time: 11/17/22 11:30 AM  Result Value Ref Range   Cholesterol 129 0 - 200 mg/dL   Triglycerides 81 <150 mg/dL   HDL 65 >40 mg/dL   Total CHOL/HDL Ratio 2.0 RATIO   VLDL 16 0 - 40 mg/dL   LDL Cholesterol 48 0 - 99 mg/dL    Comment:        Total Cholesterol/HDL:CHD Risk Coronary Heart Disease Risk Table                     Men   Women  1/2 Average Risk   3.4   3.3  Average Risk       5.0   4.4  2 X Average Risk   9.6   7.1  3 X Average Risk  23.4   11.0        Use the calculated Patient Ratio above and the CHD Risk Table to determine the patient's CHD Risk.        ATP III CLASSIFICATION (LDL):  <100     mg/dL   Optimal  100-129  mg/dL   Near or Above                    Optimal  130-159  mg/dL   Borderline  160-189  mg/dL   High  >190     mg/dL   Very High Performed at [facility], [address] [place], [place] [address]   TSH     Status: None   Collection Time: 11/17/22 11:30 AM  Result Value Ref Range   TSH 3.769 0.350 - 4.500 uIU/mL    Comment: Performed by a 3rd Generation assay with a functional sensitivity of <=0.01 uIU/mL. Performed at [facility], [address] [place], [place] [address]     Blood Alcohol level:  Lab Results  Component Value Date   [facility] <10 [id number]    Metabolic Disorder Labs: Lab Results  Component Value Date   HGBA1C 5.7 (H) 07/23/2022   MPG 117 07/23/2022   No results found for: "PROLACTIN" Lab Results  Component Value Date   CHOL 129 11/17/2022   TRIG 81 11/17/2022   HDL 65 11/17/2022   CHOLHDL 2.0 11/17/2022   VLDL 16 11/17/2022   LDLCALC 48 11/17/2022   LDLCALC 87 07/23/2022  Physical Findings: AIMS:  , ,  ,  ,    CIWA:    COWS:     Musculoskeletal: Strength & Muscle Tone: within normal limits Gait & Station: normal Patient leans: N/A  Psychiatric Specialty Exam:  Presentation  General Appearance:  Bizarre  Eye Contact: Minimal  Speech: Pressured; Clear and Coherent  Speech Volume: Increased  Handedness: Right   Mood and Affect  Mood: Angry; Irritable  Affect: Inappropriate; Full Range   Thought Process  Thought Processes: Coherent  Descriptions of Associations:Circumstantial  Orientation:Full (Time, Place and Person)  Thought Content:Logical; Obsessions  History of Schizophrenia/Schizoaffective disorder:No  Duration of Psychotic Symptoms:Less than six months  Hallucinations:No data recorded Ideas of Reference:None  Suicidal Thoughts:No data recorded Homicidal Thoughts:No data recorded  Sensorium  Memory: Immediate Good; Recent Good; Remote Good  Judgment: Poor  Insight: Fair   Community education officer  Concentration: Poor  Attention Span: Poor  Recall: Good  Fund of Knowledge: Good  Language: Good   Psychomotor Activity   Psychomotor Activity:No data recorded  Assets  Assets: Communication Skills; Desire for Improvement; Social Support; Resilience; Physical Health   Sleep  Sleep:No data recorded   Physical Exam: Physical Exam Vitals and nursing note reviewed.  Constitutional:      Appearance: Normal appearance.  HENT:     Head: Normocephalic and atraumatic.     Mouth/Throat:     Pharynx: Oropharynx is clear.  Eyes:     Pupils: Pupils are equal, round, and reactive to light.  Cardiovascular:     Rate and Rhythm: Normal rate and regular rhythm.  Pulmonary:     Effort: Pulmonary effort is normal.     Breath sounds: Normal breath sounds.  Abdominal:     General: Abdomen is flat.     Palpations: Abdomen is soft.  Musculoskeletal:        General: Normal range of motion.  Skin:    General: Skin is warm and dry.  Neurological:     General: No focal deficit present.     Mental Status: He is alert. Mental status is at baseline.  Psychiatric:        Attention and Perception: He is inattentive.        Mood and Affect: Mood is anxious. Affect is blunt.        Speech: Speech is tangential.        Behavior: Behavior is agitated. Behavior is not aggressive.        Thought Content: Thought content is paranoid and delusional.        Cognition and Memory: Cognition is impaired.    Review of Systems  Constitutional: Negative.   HENT: Negative.    Eyes: Negative.   Respiratory: Negative.    Cardiovascular: Negative.   Gastrointestinal: Negative.   Musculoskeletal: Negative.   Skin: Negative.   Neurological: Negative.   Psychiatric/Behavioral:  The patient is nervous/anxious.    Blood pressure 134/86, pulse 90, temperature 97.8 F (36.6 C), temperature source Oral, resp. rate 18, height '6\' 5"'$  (1.956 m), weight 102.1 kg, SpO2 100 %. Body mass index is 26.68 kg/m.   Treatment Plan Summary: Medication management and Plan patient with schizoaffective disorder.  Slightly improved.  Increase  Seroquel to 400 at night.  I suggested to him that we restart lithium but he is against it based on the reasoning that I think is probably misguided so I will continue to talk about it with him  Alethia Berthold, MD 11/18/2022, 12:10 PM

## 2022-11-18 NOTE — Plan of Care (Signed)
Data: Denies SI / HI / AVH. Patient has been found multiple time in other patients rooms. Patient is easily redirected but continue behavior. Pt has had multiple staff members educate him on not entering other rooms. PT continue to be confused and disorganized.   Action:  Q x 15 minute observation checks were completed for safety. Patient was provided with education on medications. Patient was offered support and encouragement. Patient was given scheduled medications. Patient  was encourage to attend groups, participate in unit activities and continue with plan of care.   ` Response:Patient has no complaints at this time. Patient is receptive to treatment and safety maintained on unit.   Problem: Education: Goal: Knowledge of General Education information will improve Description: Including pain rating scale, medication(s)/side effects and non-pharmacologic comfort measures Outcome: Not Progressing   Problem: Health Behavior/Discharge Planning: Goal: Ability to manage health-related needs will improve Outcome: Not Progressing

## 2022-11-18 NOTE — [facility] Group Notes (Signed)
[facility] Group Notes:  (Nursing/MHT/Case Management/Adjunct)  Date:  11/18/2022  Time:  5:01 PM  Type of Therapy:  Psychoeducational Skills  Participation Level:  Minimal  Participation Quality:  Appropriate  Affect:  Appropriate  Cognitive:  Appropriate  Insight:  Appropriate  Engagement in Group:  Engaged  Modes of Intervention:  Activity  Summary of Progress/Problems:  Curtis Hodges 11/18/2022, 5:01 PM

## 2022-11-18 NOTE — [facility] Group Notes (Signed)
[facility] Group Notes:  (Nursing/MHT/Case Management/Adjunct)  Date:  11/18/2022  Time:  5:02 PM  Type of Therapy:  Psychoeducational Skills  Participation Level:  Active  Participation Quality:  Appropriate  Affect:  Appropriate  Cognitive:  Appropriate  Insight:  Appropriate  Engagement in Group:  Engaged  Modes of Intervention:  Activity  Summary of Progress/Problems:  Curtis Hodges [name] 11/18/2022, 5:02 PM

## 2022-11-19 ENCOUNTER — Encounter: Payer: BC Managed Care – PPO | Admitting: Physical Therapy

## 2022-11-19 DIAGNOSIS — F250 Schizoaffective disorder, bipolar type: Secondary | ICD-10-CM | POA: Diagnosis not present

## 2022-11-19 LAB — HEMOGLOBIN A1C
Hgb A1c MFr Bld: 5.8 % — ABNORMAL HIGH (ref 4.8–5.6)
Mean Plasma Glucose: 120 mg/dL

## 2022-11-19 MED ORDER — QUETIAPINE FUMARATE 100 MG PO TABS
100.0000 mg | ORAL_TABLET | Freq: Every day | ORAL | Status: DC
  Administered 2022-11-19 – 2022-11-23 (×4): 100 mg via ORAL
  Filled 2022-11-19 (×6): qty 1

## 2022-11-19 NOTE — Progress Notes (Signed)
As per per the cardiologist the EKG done on 11/15/22 is good for him and told not to repeat the EKG again.

## 2022-11-19 NOTE — Consult Note (Signed)
CARDIOLOGY CONSULT NOTE               Patient ID: Curtis Hodges MRN: [id number] DOB/AGE: [birth year] 52 y.o.  Admit date: 11/15/2022 Referring Physician Dr. Alethia Berthold psychiatry Primary Physician  Primary Cardiologist Dr. Loyal Buba [place] Reason for Consultation atrial fibrillation consideration for interested in lithium therapy  HPI: Patient presented to psychiatric facility because of chronic mental health problems concern for decompensation because of bipolar disorder and schizophrenia has had difficulty sleeping and restlessness and some delirious thought of hide ears trouble with maintaining focus and concentration patient reports has been compliant with his medication denies drugs or alcohol with worsening condition which may be threatening his job at [employer] he was brought in for further evaluation and management.  Patient has been treated for his schizophrenia and bipolar in the past with lithium done reasonably well but was discontinued for unclear reasons he has been on digoxin for atrial fibrillation now here for consideration of reinstituting of lithium to help with his current psychiatric condition  Review of systems complete and found to be negative unless listed above     Past Medical History:  Diagnosis Date   Atrial fibrillation ([place])    Hypertension    Thyroid disease     Past Surgical History:  Procedure Laterality Date   CHOLECYSTECTOMY     GALLBLADDER SURGERY      Medications Prior to Admission  Medication Sig Dispense Refill Last Dose   busPIRone (BUSPAR) 15 MG tablet Take 30 mg by mouth 2 (two) times daily.      digoxin (LANOXIN) 0.25 MG tablet Take 250 mcg by mouth daily.      levothyroxine (SYNTHROID) 75 MCG tablet Take 1 tablet (75 mcg total) by mouth daily. 90 tablet 3    metoprolol succinate (TOPROL-XL) 50 MG 24 hr tablet Take 50 mg by mouth at bedtime.      midodrine (PROAMATINE) 5 MG tablet Take 5 mg by mouth 2 (two) times daily with a meal.       mirtazapine (REMERON) 30 MG tablet Take 30 mg by mouth at bedtime.      mupirocin ointment (BACTROBAN) 2 % Apply 1 Application topically 2 (two) times daily. 22 g 0    oxcarbazepine (TRILEPTAL) 600 MG tablet Take 600 mg by mouth 2 (two) times daily.      ramelteon (ROZEREM) 8 MG tablet Take 8 mg by mouth at bedtime.      risperiDONE (RISPERDAL M-TABS) 0.5 MG disintegrating tablet Take 0.5 mg by mouth daily.      XARELTO 20 MG TABS tablet Take 20 mg by mouth every morning.      Social History   Socioeconomic History   Marital status: Single    Spouse name: Not on file   Number of children: Not on file   Years of education: Not on file   Highest education level: Not on file  Occupational History   Not on file  Tobacco Use   Smoking status: Never   Smokeless tobacco: Never  Vaping Use   Vaping Use: Never used  Substance and Sexual Activity   Alcohol use: Not Currently   Drug use: Never   Sexual activity: Not Currently  Other Topics Concern   Not on file  Social History Narrative   Not on file   Social Determinants of Health   Financial Resource Strain: Low Risk  (07/23/2022)   Overall Financial Resource Strain (CARDIA)    Difficulty of Paying Living Expenses:  Not hard at all  Food Insecurity: No Food Insecurity (11/15/2022)   Hunger Vital Sign    Worried About Running Out of Food in the Last Year: Never true    Ran Out of Food in the Last Year: Never true  Transportation Needs: No Transportation Needs (11/15/2022)   PRAPARE - Hydrologist (Medical): No    Lack of Transportation (Non-Medical): No  Physical Activity: Insufficiently Active (07/23/2022)   Exercise Vital Sign    Days of Exercise per Week: 5 days    Minutes of Exercise per Session: 10 min  Stress: No Stress Concern Present (07/23/2022)   [place]    Feeling of Stress : Only a little  Social Connections: Socially  Isolated (07/23/2022)   Social Connection and Isolation Panel [NHANES]    Frequency of Communication with Friends and Family: Once a week    Frequency of Social Gatherings with Friends and Family: Once a week    Attends Religious Services: Never    Marine scientist or Organizations: No    Attends Music therapist: 1 to 4 times per year    Marital Status: Never married  Intimate Partner Violence: Not At Risk (11/15/2022)   Humiliation, Afraid, Rape, and Kick questionnaire    Fear of Current or Ex-Partner: No    Emotionally Abused: No    Physically Abused: No    Sexually Abused: No    Family History  Problem Relation Age of Onset   COPD Mother    Atrial fibrillation Mother    Brain cancer Father       Review of systems complete and found to be negative unless listed above      PHYSICAL EXAM  General: Well developed, well nourished, in no acute distress HEENT:  Normocephalic and atramatic Neck:  No JVD.  Lungs: Clear bilaterally to auscultation and percussion. Heart: Irregular irregular. Normal S1 and S2 without gallops or murmurs.  Abdomen: Bowel sounds are positive, abdomen soft and non-tender  Msk:  Back normal, normal gait. Normal strength and tone for age. Extremities: No clubbing, cyanosis or 1+edema.   Neuro: Alert and oriented X 3. Psych:  Good affect, responds appropriately  Labs:   Lab Results  Component Value Date   WBC 8.0 11/14/2022   HGB 14.1 11/14/2022   HCT 42.5 11/14/2022   MCV 96.8 11/14/2022   PLT 321 11/14/2022    Recent Labs  Lab 11/14/22 1623  NA 136  K 4.2  CL 104  CO2 24  BUN 20  CREATININE 0.92  CALCIUM 9.7  PROT 8.0  BILITOT 0.5  ALKPHOS 114  ALT 22  AST 26  GLUCOSE 124*   No results found for: "CKTOTAL", "CKMB", "CKMBINDEX", "TROPONINI"  Lab Results  Component Value Date   CHOL 129 11/17/2022   CHOL 155 07/23/2022   Lab Results  Component Value Date   HDL 65 11/17/2022   HDL 53 07/23/2022   Lab  Results  Component Value Date   LDLCALC 48 11/17/2022   LDLCALC 87 07/23/2022   Lab Results  Component Value Date   TRIG 81 11/17/2022   TRIG 67 07/23/2022   Lab Results  Component Value Date   CHOLHDL 2.0 11/17/2022   CHOLHDL 2.9 07/23/2022   No results found for: "LDLDIRECT"    Radiology: No results found.  EKG: Atrial fibrillation rate controlled at around 60 nonspecific ST-T wave changes  ASSESSMENT AND  PLAN:  Atrial fibrillation Hypertension Schizophrenia Bipolar disorder Mild cardiomyopathy EF around 45% Stable pulmonary embolus Chronic systolic congestive heart failure Lower extremity edema Thyroid disease  Plan Continue metoprolol for rate control for atrial fibrillation Agree with Xarelto for anticoagulation for atrial fibrillation Recommend discontinuing digoxin reportedly for atrial fibrillation Consider ACE or ARB because of mild systolic cardiomyopathy Consider spironolactone continue beta-blocker because mild cardiomyopathy Appears to be an adequate candidate for lithium therapy if necessary Lower extremity edema consider diuretics support stockings elevation I do not necessarily recommend antiarrhythmic at this point   Signed: Yolonda Kida MD, 11/19/2022, 11:44 AM

## 2022-11-19 NOTE — Plan of Care (Signed)
Patient in and out of the room wandering around in the unit.No irritable behaviors noted. Patient states " I am getting out of here at 4.30." Patient denies SI,HI and AVH. Appetite and energy level good. Compliant with medications. Support and encouragement given.

## 2022-11-19 NOTE — Progress Notes (Signed)
Patient became very agitated after talking to his Mother on the phone stating he wants Hansel Starling, he wants to go home. Patient pacing in the hall wall appears confused stating I am very anxious I need my Hansel Starling tonight Patient went in his room banging his door. Patient offered PRN medications and compliant PRN medications given (See Mar). Q 15 minutes safety checks ongoing. Support and encouragement provided.

## 2022-11-19 NOTE — Group Note (Signed)
[facility] LCSW Group Therapy Note   Group Date: 11/19/2022 Start Time: 1300 End Time: 1400  Type of Therapy and Topic:  Group Therapy:  Feelings around Relapse and Recovery  Participation Level:  Minimal   Description of Group:    Patients in this group will discuss emotions they experience before and after a relapse. They will process how experiencing these feelings, or avoidance of experiencing them, relates to having a relapse. Facilitator will guide patients to explore emotions they have related to recovery. Patients will be encouraged to process which emotions are more powerful. They will be guided to discuss the emotional reaction significant others in their lives may have to patients' relapse or recovery. Patients will be assisted in exploring ways to respond to the emotions of others without this contributing to a relapse.  Therapeutic Goals: Patient will identify two or more emotions that lead to relapse for them:  Patient will identify two emotions that result when they relapse:  Patient will identify two emotions related to recovery:  Patient will demonstrate ability to communicate their needs through discussion and/or role plays.   Summary of Patient Progress: Patient was present for the entirety of group session. Patient participated in opening and closing remarks. However, patient did not contribute at all to the topic of discussion despite encouraged participation.    Therapeutic Modalities:   Cognitive Behavioral Therapy Solution-Focused Therapy Assertiveness Training Relapse Prevention Therapy   Durenda Hurt, [place]

## 2022-11-19 NOTE — Progress Notes (Signed)
[facility] MD Progress Note  11/19/2022 11:21 AM Curtis Hodges  MRN:  [id number] Subjective: Follow-up for this 52 year old man with schizoaffective disorder.  Patient remains hyperactive.  He had a hard time sitting still for a conversation with me.  I wanted to talk with him about the possibility of restarting lithium but he jumped up in the middle of the conversation and walked out.  While he was here he seemed a little confused and still talks only about needing to get out of the hospital but was not quite as bizarre as he was the last couple days.  Still poor self-care.  No specific physical complaints Principal Problem: Schizoaffective disorder, bipolar type (HCC) Diagnosis: Principal Problem:   Schizoaffective disorder, bipolar type ([place]) Active Problems:   Hypothyroidism, unspecified   Unspecified atrial fibrillation (HCC)   Personal history of pulmonary embolism   Bilateral edema of lower extremity   Chronic systolic heart failure (HCC)   Bipolar 1 disorder (HCC)  Total Time spent with patient: 30 minutes  Past Psychiatric History: Past history of schizoaffective disorder  Past Medical History:  Past Medical History:  Diagnosis Date   Atrial fibrillation ([place])    Hypertension    Thyroid disease     Past Surgical History:  Procedure Laterality Date   CHOLECYSTECTOMY     GALLBLADDER SURGERY     Family History:  Family History  Problem Relation Age of Onset   COPD Mother    Atrial fibrillation Mother    Brain cancer Father    Family Psychiatric  History: See previous Social History:  Social History   Substance and Sexual Activity  Alcohol Use Not Currently     Social History   Substance and Sexual Activity  Drug Use Never    Social History   Socioeconomic History   Marital status: Single    Spouse name: Not on file   Number of children: Not on file   Years of education: Not on file   Highest education level: Not on file  Occupational History   Not on file   Tobacco Use   Smoking status: Never   Smokeless tobacco: Never  Vaping Use   Vaping Use: Never used  Substance and Sexual Activity   Alcohol use: Not Currently   Drug use: Never   Sexual activity: Not Currently  Other Topics Concern   Not on file  Social History Narrative   Not on file   Social Determinants of Health   Financial Resource Strain: Low Risk  (07/23/2022)   Overall Financial Resource Strain (CARDIA)    Difficulty of Paying Living Expenses: Not hard at all  Food Insecurity: No Food Insecurity (11/15/2022)   Hunger Vital Sign    Worried About Running Out of Food in the Last Year: Never true    Ran Out of Food in the Last Year: Never true  Transportation Needs: No Transportation Needs (11/15/2022)   PRAPARE - Hydrologist (Medical): No    Lack of Transportation (Non-Medical): No  Physical Activity: Insufficiently Active (07/23/2022)   Exercise Vital Sign    Days of Exercise per Week: 5 days    Minutes of Exercise per Session: 10 min  Stress: No Stress Concern Present (07/23/2022)   [place]    Feeling of Stress : Only a little  Social Connections: Socially Isolated (07/23/2022)   Social Connection and Isolation Panel [NHANES]    Frequency of Communication with  Friends and Family: Once a week    Frequency of Social Gatherings with Friends and Family: Once a week    Attends Religious Services: Never    Printmaker: No    Attends Music therapist: 1 to 4 times per year    Marital Status: Never married   Additional Social History:                         Sleep: Fair  Appetite:  Fair  Current Medications: Current Facility-Administered Medications  Medication Dose Route Frequency Provider Last Rate Last Admin   acetaminophen (TYLENOL) tablet 650 mg  650 mg Oral Q6H PRN Caroline Sauger, NP       alum & mag  hydroxide-simeth (MAALOX/MYLANTA) 200-200-20 MG/5ML suspension 30 mL  30 mL Oral Q4H PRN Caroline Sauger, NP       haloperidol (HALDOL) tablet 5 mg  5 mg Oral Q6H PRN Caroline Sauger, NP       And   benztropine (COGENTIN) tablet 1 mg  1 mg Oral Q6H PRN Caroline Sauger, NP       busPIRone (BUSPAR) tablet 15 mg  15 mg Oral BID Caliyah Sieh, Madie Reno, MD   15 mg at 11/19/22 [id number]   diazepam (VALIUM) injection 5 mg  5 mg Intramuscular Q4H PRN Lenis Nettleton, Madie Reno, MD       digoxin (LANOXIN) tablet 250 mcg  250 mcg Oral Daily Caroline Sauger, NP   250 mcg at 11/19/22 0857   diphenhydrAMINE (BENADRYL) capsule 50 mg  50 mg Oral Q6H PRN Jahmari Esbenshade, Madie Reno, MD       Or   diphenhydrAMINE (BENADRYL) injection 50 mg  50 mg Intramuscular Q6H PRN Riley Papin T, MD       haloperidol (HALDOL) tablet 5 mg  5 mg Oral Q6H PRN Lillee Mooneyhan, Madie Reno, MD       Or   haloperidol lactate (HALDOL) injection 5 mg  5 mg Intramuscular Q6H PRN Macallister Ashmead, Madie Reno, MD       levothyroxine (SYNTHROID) tablet 75 mcg  75 mcg Oral Q0600 Caroline Sauger, NP   75 mcg at 11/19/22 0658   risperiDONE (RISPERDAL M-TABS) disintegrating tablet 2 mg  2 mg Oral Q8H PRN Caroline Sauger, NP   2 mg at 11/17/22 1146   And   LORazepam (ATIVAN) tablet 1 mg  1 mg Oral PRN Caroline Sauger, NP       risperiDONE (RISPERDAL M-TABS) disintegrating tablet 2 mg  2 mg Oral Q8H PRN Caroline Sauger, NP       And   LORazepam (ATIVAN) tablet 1 mg  1 mg Oral PRN Caroline Sauger, NP       LORazepam (ATIVAN) tablet 2 mg  2 mg Oral Q4H PRN Amadi Yoshino T, MD   2 mg at 11/16/22 1743   magnesium hydroxide (MILK OF MAGNESIA) suspension 30 mL  30 mL Oral Daily PRN Caroline Sauger, NP       metoprolol succinate (TOPROL-XL) 24 hr tablet 50 mg  50 mg Oral QHS Caroline Sauger, NP   50 mg at 11/18/22 2135   midodrine (PROAMATINE) tablet 5 mg  5 mg Oral BID WC Caroline Sauger, NP   5 mg at 11/19/22 [id number]   Oxcarbazepine (TRILEPTAL)  tablet 600 mg  600 mg Oral BID Caroline Sauger, NP   600 mg at 11/19/22 0856   QUEtiapine (SEROQUEL) tablet 100 mg  100 mg Oral Daily Leib Elahi  T, MD   100 mg at 11/19/22 1109   QUEtiapine (SEROQUEL) tablet 400 mg  400 mg Oral QHS Amiria Orrison T, MD   400 mg at 11/18/22 2135   ramelteon (ROZEREM) tablet 8 mg  8 mg Oral QHS Caroline Sauger, NP   8 mg at 11/18/22 2136   risperiDONE (RISPERDAL M-TABS) disintegrating tablet 1.5 mg  1.5 mg Oral QHS Sal Spratley T, MD   1.5 mg at 11/18/22 2135   rivaroxaban (XARELTO) tablet 20 mg  20 mg Oral q morning Caroline Sauger, NP   20 mg at 11/19/22 [id number]   temazepam (RESTORIL) capsule 15 mg  15 mg Oral QHS PRN Kylian Loh, Madie Reno, MD   15 mg at 11/18/22 2135    Lab Results:  Results for orders placed or performed during the hospital encounter of 11/15/22 (from the past 48 hour(s))  Lipid panel     Status: None   Collection Time: 11/17/22 11:30 AM  Result Value Ref Range   Cholesterol 129 0 - 200 mg/dL   Triglycerides 81 <150 mg/dL   HDL 65 >40 mg/dL   Total CHOL/HDL Ratio 2.0 RATIO   VLDL 16 0 - 40 mg/dL   LDL Cholesterol 48 0 - 99 mg/dL    Comment:        Total Cholesterol/HDL:CHD Risk Coronary Heart Disease Risk Table                     Men   Women  1/2 Average Risk   3.4   3.3  Average Risk       5.0   4.4  2 X Average Risk   9.6   7.1  3 X Average Risk  23.4   11.0        Use the calculated Patient Ratio above and the CHD Risk Table to determine the patient's CHD Risk.        ATP III CLASSIFICATION (LDL):  <100     mg/dL   Optimal  100-129  mg/dL   Near or Above                    Optimal  130-159  mg/dL   Borderline  160-189  mg/dL   High  >190     mg/dL   Very High Performed at [facility], [address] [place], [place] [address]   TSH     Status: None   Collection Time: 11/17/22 11:30 AM  Result Value Ref Range   TSH 3.769 0.350 - 4.500 uIU/mL    Comment: Performed by a 3rd Generation assay with a  functional sensitivity of <=0.01 uIU/mL. Performed at [facility], [address] [place], [place] [address]     Blood Alcohol level:  Lab Results  Component Value Date   [facility] <10 [id number]    Metabolic Disorder Labs: Lab Results  Component Value Date   HGBA1C 5.7 (H) 07/23/2022   MPG 117 07/23/2022   No results found for: "PROLACTIN" Lab Results  Component Value Date   CHOL 129 11/17/2022   TRIG 81 11/17/2022   HDL 65 11/17/2022   CHOLHDL 2.0 11/17/2022   VLDL 16 11/17/2022   LDLCALC 48 11/17/2022   LDLCALC 87 07/23/2022    Physical Findings: AIMS:  , ,  ,  ,    CIWA:    COWS:     Musculoskeletal: Strength & Muscle Tone: within normal limits Gait & Station: normal Patient leans:  N/A  Psychiatric Specialty Exam:  Presentation  General Appearance:  Bizarre  Eye Contact: Minimal  Speech: Pressured; Clear and Coherent  Speech Volume: Increased  Handedness: Right   Mood and Affect  Mood: Angry; Irritable  Affect: Inappropriate; Full Range   Thought Process  Thought Processes: Coherent  Descriptions of Associations:Circumstantial  Orientation:Full (Time, Place and Person)  Thought Content:Logical; Obsessions  History of Schizophrenia/Schizoaffective disorder:No  Duration of Psychotic Symptoms:Less than six months  Hallucinations:No data recorded Ideas of Reference:None  Suicidal Thoughts:No data recorded Homicidal Thoughts:No data recorded  Sensorium  Memory: Immediate Good; Recent Good; Remote Good  Judgment: Poor  Insight: Fair   Community education officer  Concentration: Poor  Attention Span: Poor  Recall: Good  Fund of Knowledge: Good  Language: Good   Psychomotor Activity  Psychomotor Activity:No data recorded  Assets  Assets: Communication Skills; Desire for Improvement; Social Support; Resilience; Physical Health   Sleep  Sleep:No data recorded   Physical Exam: Physical Exam Vitals  and nursing note reviewed.  Constitutional:      Appearance: Normal appearance.  HENT:     Head: Normocephalic and atraumatic.     Mouth/Throat:     Pharynx: Oropharynx is clear.  Eyes:     Pupils: Pupils are equal, round, and reactive to light.  Cardiovascular:     Rate and Rhythm: Normal rate and regular rhythm.  Pulmonary:     Effort: Pulmonary effort is normal.     Breath sounds: Normal breath sounds.  Abdominal:     General: Abdomen is flat.     Palpations: Abdomen is soft.  Musculoskeletal:        General: Normal range of motion.  Skin:    General: Skin is warm and dry.  Neurological:     General: No focal deficit present.     Mental Status: He is alert. Mental status is at baseline.  Psychiatric:        Attention and Perception: He is inattentive.        Mood and Affect: Mood is elated. Affect is labile.        Speech: Speech is tangential.        Behavior: Behavior is agitated. Behavior is not aggressive.        Thought Content: Thought content is delusional.        Cognition and Memory: Memory is impaired.        Judgment: Judgment is impulsive.    Review of Systems  Constitutional: Negative.   HENT: Negative.    Eyes: Negative.   Respiratory: Negative.    Cardiovascular: Negative.   Gastrointestinal: Negative.   Musculoskeletal: Negative.   Skin: Negative.   Neurological: Negative.   Psychiatric/Behavioral:  The patient is nervous/anxious.    Blood pressure 134/86, pulse 90, temperature 97.8 F (36.6 C), temperature source Oral, resp. rate 18, height '6\' 5"'$  (1.956 m), weight 102.1 kg, SpO2 100 %. Body mass index is 26.68 kg/m.   Treatment Plan Summary: Medication management and Plan patient appears to be still having psychotic symptoms and manic symptoms and not to be at his baseline.  Does not appear to be functioning normally.  After reviewing his old chart quite a bit it seems to me that the lithium was very effective and that the reason it was stopped  was because of what I think was probably an incorrect decision by psychiatry to stop it.  Patient firmly believes that he was told that lithium would be dangerous to his heart.  There certainly is such a thing is lithium induced cardiotoxicity but I am not sure that that is what we are looking at here.  I have placed a cardiology consult.  Meanwhile I have added some Seroquel in the morning as well as at night to help with getting him stable around the clock.  Alethia Berthold, MD 11/19/2022, 11:21 AM

## 2022-11-19 NOTE — Group Note (Signed)
Recreation Therapy Group Note   Group Topic:Self-Esteem  Group Date: 11/19/2022 Start Time: 1000 End Time: 1055 Facilitators: Vilma Prader, LRT, CTRS Location:  Craft Room  Group Description: Positive Focus. Patients are given a handout that has 9 different boxes on it. Each box has a different prompt on it that requires you to identify and add something positive about themselves. LRT encourages pts to share two of their boxes to the group. LRT and pts discuss the importance of "thinking positive", self-esteem and how they can apply it to their everyday life post-discharge.  Affect/Mood: N/A   Participation Level: Did not attend    Clinical Observations/Individualized Feedback:Curtis Hodges did not attend group due to resting in his room.   Plan: Continue to engage patient in RT group sessions 2-3x/week.   Vilma Prader, LRT, CTRS 11/19/2022 11:20 AM

## 2022-11-20 ENCOUNTER — Encounter: Payer: BC Managed Care – PPO | Admitting: Physical Therapy

## 2022-11-20 DIAGNOSIS — F250 Schizoaffective disorder, bipolar type: Secondary | ICD-10-CM | POA: Diagnosis not present

## 2022-11-20 LAB — DIGOXIN LEVEL: Digoxin Level: 1 ng/mL (ref 0.8–2.0)

## 2022-11-20 MED ORDER — LITHIUM CARBONATE 300 MG PO CAPS
300.0000 mg | ORAL_CAPSULE | Freq: Two times a day (BID) | ORAL | Status: DC
  Administered 2022-11-21 – 2022-11-27 (×8): 300 mg via ORAL
  Filled 2022-11-20 (×12): qty 1

## 2022-11-20 NOTE — Progress Notes (Signed)
[facility] MD Progress Note  11/20/2022 4:16 PM Curtis Hodges  MRN:  [id number] Subjective: Follow-up for this 52 year old man with schizoaffective disorder.  Patient is a little calmer again today not disruptive.  Sitting down with him he again is disorganized but with redirection is able to stay on task more and was able to sit down for the entire conversation.  Very much appreciate cardiology consult from yesterday making me feel more confident about changing his mood stabilizing medicine. Principal Problem: Schizoaffective disorder, bipolar type (HCC) Diagnosis: Principal Problem:   Schizoaffective disorder, bipolar type ([place]) Active Problems:   Hypothyroidism, unspecified   Unspecified atrial fibrillation ([place])   Personal history of pulmonary embolism   Bilateral edema of lower extremity   Chronic systolic heart failure (HCC)   Bipolar 1 disorder (HCC)  Total Time spent with patient: 30 minutes  Past Psychiatric History: Past history of longstanding schizoaffective disorder  Past Medical History:  Past Medical History:  Diagnosis Date   Atrial fibrillation ([place])    Hypertension    Thyroid disease     Past Surgical History:  Procedure Laterality Date   CHOLECYSTECTOMY     GALLBLADDER SURGERY     Family History:  Family History  Problem Relation Age of Onset   COPD Mother    Atrial fibrillation Mother    Brain cancer Father    Family Psychiatric  History: See previous Social History:  Social History   Substance and Sexual Activity  Alcohol Use Not Currently     Social History   Substance and Sexual Activity  Drug Use Never    Social History   Socioeconomic History   Marital status: Single    Spouse name: Not on file   Number of children: Not on file   Years of education: Not on file   Highest education level: Not on file  Occupational History   Not on file  Tobacco Use   Smoking status: Never   Smokeless tobacco: Never  Vaping Use   Vaping Use: Never used   Substance and Sexual Activity   Alcohol use: Not Currently   Drug use: Never   Sexual activity: Not Currently  Other Topics Concern   Not on file  Social History Narrative   Not on file   Social Determinants of Health   Financial Resource Strain: Low Risk  (07/23/2022)   Overall Financial Resource Strain (CARDIA)    Difficulty of Paying Living Expenses: Not hard at all  Food Insecurity: No Food Insecurity (11/15/2022)   Hunger Vital Sign    Worried About Running Out of Food in the Last Year: Never true    Ran Out of Food in the Last Year: Never true  Transportation Needs: No Transportation Needs (11/15/2022)   PRAPARE - Hydrologist (Medical): No    Lack of Transportation (Non-Medical): No  Physical Activity: Insufficiently Active (07/23/2022)   Exercise Vital Sign    Days of Exercise per Week: 5 days    Minutes of Exercise per Session: 10 min  Stress: No Stress Concern Present (07/23/2022)   [place]    Feeling of Stress : Only a little  Social Connections: Socially Isolated (07/23/2022)   Social Connection and Isolation Panel [NHANES]    Frequency of Communication with Friends and Family: Once a week    Frequency of Social Gatherings with Friends and Family: Once a week    Attends Religious Services: Never  Active Member of Clubs or Organizations: No    Attends Archivist Meetings: 1 to 4 times per year    Marital Status: Never married   Additional Social History:                         Sleep: Fair  Appetite:  Fair  Current Medications: Current Facility-Administered Medications  Medication Dose Route Frequency Provider Last Rate Last Admin   acetaminophen (TYLENOL) tablet 650 mg  650 mg Oral Q6H PRN Caroline Sauger, NP       alum & mag hydroxide-simeth (MAALOX/MYLANTA) 200-200-20 MG/5ML suspension 30 mL  30 mL Oral Q4H PRN Caroline Sauger, NP        haloperidol (HALDOL) tablet 5 mg  5 mg Oral Q6H PRN Caroline Sauger, NP       And   benztropine (COGENTIN) tablet 1 mg  1 mg Oral Q6H PRN Caroline Sauger, NP       busPIRone (BUSPAR) tablet 15 mg  15 mg Oral BID Brigham Cobbins, Madie Reno, MD   15 mg at 11/20/22 0818   diazepam (VALIUM) injection 5 mg  5 mg Intramuscular Q4H PRN Anala Whisenant, Madie Reno, MD       digoxin (LANOXIN) tablet 250 mcg  250 mcg Oral Daily Caroline Sauger, NP   250 mcg at 11/20/22 [id number]   diphenhydrAMINE (BENADRYL) capsule 50 mg  50 mg Oral Q6H PRN Chrystine Frogge, Madie Reno, MD   50 mg at 11/19/22 1952   Or   diphenhydrAMINE (BENADRYL) injection 50 mg  50 mg Intramuscular Q6H PRN Charae Depaolis, Madie Reno, MD       haloperidol (HALDOL) tablet 5 mg  5 mg Oral Q6H PRN Jonquil Stubbe, Madie Reno, MD   5 mg at 11/19/22 1952   Or   haloperidol lactate (HALDOL) injection 5 mg  5 mg Intramuscular Q6H PRN Kathlen Sakurai, Madie Reno, MD       levothyroxine (SYNTHROID) tablet 75 mcg  75 mcg Oral Q0600 Caroline Sauger, NP   75 mcg at 11/20/22 [id number]   lithium carbonate capsule 300 mg  300 mg Oral BID WC Nobuo Nunziata, Madie Reno, MD       risperiDONE (RISPERDAL M-TABS) disintegrating tablet 2 mg  2 mg Oral Q8H PRN Caroline Sauger, NP   2 mg at 11/17/22 1146   And   LORazepam (ATIVAN) tablet 1 mg  1 mg Oral PRN Caroline Sauger, NP       risperiDONE (RISPERDAL M-TABS) disintegrating tablet 2 mg  2 mg Oral Q8H PRN Caroline Sauger, NP       And   LORazepam (ATIVAN) tablet 1 mg  1 mg Oral PRN Caroline Sauger, NP       LORazepam (ATIVAN) tablet 2 mg  2 mg Oral Q4H PRN Azell Bill T, MD   2 mg at 11/19/22 1952   magnesium hydroxide (MILK OF MAGNESIA) suspension 30 mL  30 mL Oral Daily PRN Caroline Sauger, NP       metoprolol succinate (TOPROL-XL) 24 hr tablet 50 mg  50 mg Oral QHS Caroline Sauger, NP   50 mg at 11/19/22 2106   midodrine (PROAMATINE) tablet 5 mg  5 mg Oral BID WC Caroline Sauger, NP   5 mg at 11/20/22 [id number]   Oxcarbazepine  (TRILEPTAL) tablet 600 mg  600 mg Oral BID Caroline Sauger, NP   600 mg at 11/20/22 0818   QUEtiapine (SEROQUEL) tablet 100 mg  100 mg Oral Daily Zyion Doxtater, Madie Reno, MD  100 mg at 11/20/22 0818   QUEtiapine (SEROQUEL) tablet 400 mg  400 mg Oral QHS Kamron Portee T, MD   400 mg at 11/19/22 2106   ramelteon (ROZEREM) tablet 8 mg  8 mg Oral QHS Caroline Sauger, NP   8 mg at 11/19/22 2106   risperiDONE (RISPERDAL M-TABS) disintegrating tablet 1.5 mg  1.5 mg Oral QHS Shakirra Buehler T, MD   1.5 mg at 11/19/22 2105   rivaroxaban (XARELTO) tablet 20 mg  20 mg Oral q morning Caroline Sauger, NP   20 mg at 11/20/22 0820   temazepam (RESTORIL) capsule 15 mg  15 mg Oral QHS PRN Yordy Matton, Madie Reno, MD   15 mg at 11/18/22 2135    Lab Results:  Results for orders placed or performed during the hospital encounter of 11/15/22 (from the past 48 hour(s))  Digoxin level     Status: None   Collection Time: 11/20/22 10:51 AM  Result Value Ref Range   Digoxin Level 1.0 0.8 - 2.0 ng/mL    Comment: Performed at [facility], [address] [place], [place] [address]    Blood Alcohol level:  Lab Results  Component Value Date   [facility] <10 [id number]    Metabolic Disorder Labs: Lab Results  Component Value Date   HGBA1C 5.8 (H) 11/17/2022   MPG 120 11/17/2022   MPG 117 07/23/2022   No results found for: "PROLACTIN" Lab Results  Component Value Date   CHOL 129 11/17/2022   TRIG 81 11/17/2022   HDL 65 11/17/2022   CHOLHDL 2.0 11/17/2022   VLDL 16 11/17/2022   LDLCALC 48 11/17/2022   LDLCALC 87 07/23/2022    Physical Findings: AIMS:  , ,  ,  ,    CIWA:    COWS:     Musculoskeletal: Strength & Muscle Tone: within normal limits Gait & Station: normal Patient leans: N/A  Psychiatric Specialty Exam:  Presentation  General Appearance:  Bizarre  Eye Contact: Minimal  Speech: Pressured; Clear and Coherent  Speech Volume: Increased  Handedness: Right   Mood and  Affect  Mood: Angry; Irritable  Affect: Inappropriate; Full Range   Thought Process  Thought Processes: Coherent  Descriptions of Associations:Circumstantial  Orientation:Full (Time, Place and Person)  Thought Content:Logical; Obsessions  History of Schizophrenia/Schizoaffective disorder:No  Duration of Psychotic Symptoms:Less than six months  Hallucinations:No data recorded Ideas of Reference:None  Suicidal Thoughts:No data recorded Homicidal Thoughts:No data recorded  Sensorium  Memory: Immediate Good; Recent Good; Remote Good  Judgment: Poor  Insight: Fair   Community education officer  Concentration: Poor  Attention Span: Poor  Recall: Good  Fund of Knowledge: Good  Language: Good   Psychomotor Activity  Psychomotor Activity:No data recorded  Assets  Assets: Communication Skills; Desire for Improvement; Social Support; Resilience; Physical Health   Sleep  Sleep:No data recorded   Physical Exam: Physical Exam Vitals and nursing note reviewed.  Constitutional:      Appearance: Normal appearance.  HENT:     Head: Normocephalic and atraumatic.     Mouth/Throat:     Pharynx: Oropharynx is clear.  Eyes:     Pupils: Pupils are equal, round, and reactive to light.  Cardiovascular:     Rate and Rhythm: Normal rate and regular rhythm.  Pulmonary:     Effort: Pulmonary effort is normal.     Breath sounds: Normal breath sounds.  Abdominal:     General: Abdomen is flat.     Palpations: Abdomen is soft.  Musculoskeletal:  General: Normal range of motion.  Skin:    General: Skin is warm and dry.  Neurological:     General: No focal deficit present.     Mental Status: He is alert. Mental status is at baseline.  Psychiatric:        Attention and Perception: Attention normal.        Mood and Affect: Mood normal. Affect is blunt.        Speech: Speech is tangential.        Behavior: Behavior is slowed.        Thought Content: Thought  content is paranoid.        Cognition and Memory: Memory is impaired.    Review of Systems  Constitutional: Negative.   HENT: Negative.    Eyes: Negative.   Respiratory: Negative.    Cardiovascular: Negative.   Gastrointestinal: Negative.   Musculoskeletal: Negative.   Skin: Negative.   Neurological: Negative.   Psychiatric/Behavioral:  The patient is nervous/anxious.    Blood pressure 103/78, pulse 95, temperature 98.4 F (36.9 C), temperature source Oral, resp. rate 18, height '6\' 5"'$  (1.956 m), weight 102.1 kg, SpO2 99 %. Body mass index is 26.68 kg/m.   Treatment Plan Summary: Medication management and Plan continue other medication but start lithium at low-dose 300 twice a day.  Patient agreed to this after discussing with him cardiology report.  Alethia Berthold, MD 11/20/2022, 4:16 PM

## 2022-11-20 NOTE — B[facility] Group Notes (Signed)
[place] Group Notes:  (Nursing/MHT/Case Management/Adjunct)  Date:  11/20/2022  Time:  9:57 AM  Type of Therapy:   community meeting  Participation Level:  Active  Participation Quality:  Appropriate  Affect:  Appropriate  Cognitive:  Appropriate  Insight:  Appropriate  Engagement in Group:  Improving  Modes of Intervention:  Activity, Discussion, and Education  Summary of Progress/Problems:  Curtis Hodges 11/20/2022, 9:57 AM

## 2022-11-20 NOTE — Group Note (Signed)
LCSW Group Therapy Note  Group Date: 11/20/2022 Start Time: 1310 End Time: 1400   Type of Therapy and Topic:  Group Therapy: Positive Affirmations  Participation Level:  Minimal   Description of Group:   This group addressed positive affirmation towards self and others.  Patients went around the room and identified two positive things about themselves and two positive things about a peer in the room.  Patients reflected on how it felt to share something positive with others, to identify positive things about themselves, and to hear positive things from others/ Patients were encouraged to have a daily reflection of positive characteristics or circumstances.   Therapeutic Goals: Patients will verbalize two of their positive qualities Patients will demonstrate empathy for others by stating two positive qualities about a peer in the group Patients will verbalize their feelings when voicing positive self affirmations and when voicing positive affirmations of others Patients will discuss the potential positive impact on their wellness/recovery of focusing on positive traits of self and others.  Summary of Patient Progress:  Patient actively engaged in the discussion.  Patient was queit but attentive in group.  Patient appeared to be supportive of others.  Patient remained on topic when he did engage in group discussion.   Patient did state that he was easily distracted and requested that we shut the doors as the sound of the doors opening and closing on the unit.  Therapeutic Modalities:   Cognitive Behavioral Therapy Motivational Interviewing    Rozann Lesches, [place] 11/20/2022  2:21 PM

## 2022-11-20 NOTE — Plan of Care (Signed)
Patient in & out of his room. No aggressive behaviors noted today. Patient refused to take Lithium. States " there is a confusion between the cardiologist and psychiatrist." Patient took out the lithium and took the rest of the pills and walked away. Denies SI,HI and AVH. Appetite and energy level good. Support and encouragement given.

## 2022-11-21 DIAGNOSIS — F250 Schizoaffective disorder, bipolar type: Secondary | ICD-10-CM | POA: Diagnosis not present

## 2022-11-21 LAB — CBC
HCT: 45 % (ref 39.0–52.0)
Hemoglobin: 14.9 g/dL (ref 13.0–17.0)
MCH: 31.8 pg (ref 26.0–34.0)
MCHC: 33.1 g/dL (ref 30.0–36.0)
MCV: 95.9 fL (ref 80.0–100.0)
Platelets: 297 10*3/uL (ref 150–400)
RBC: 4.69 MIL/uL (ref 4.22–5.81)
RDW: 13.2 % (ref 11.5–15.5)
WBC: 8.5 10*3/uL (ref 4.0–10.5)
nRBC: 0 % (ref 0.0–0.2)

## 2022-11-21 NOTE — Progress Notes (Signed)
[facility] MD Progress Note  11/21/2022 10:03 AM Curtis Hodges  MRN:  [id number] Subjective: Follow-up 52 year old man with schizoaffective disorder.  Nursing reported to me that he had refused his lithium the last couple times.  Also refused his digoxin this morning.  Spoke with patient and he was disorganized and could not offer much of a rational explanation for this.  He was talking again about how I was "for feeling his wish" and asked me if he could have a press conference soon.  Not aggressive but frequently bizarre with poor hygiene and poor self-care. Principal Problem: Schizoaffective disorder, bipolar type ([place]) Diagnosis: Principal Problem:   Schizoaffective disorder, bipolar type ([place]) Active Problems:   Hypothyroidism, unspecified   Unspecified atrial fibrillation ([place])   Personal history of pulmonary embolism   Bilateral edema of lower extremity   Chronic systolic heart failure (HCC)   Bipolar 1 disorder (HCC)  Total Time spent with patient: 30 minutes  Past Psychiatric History: History of recurrent episodes of psychosis and mood symptoms likely schizoaffective from the history that I get.  Past Medical History:  Past Medical History:  Diagnosis Date   Atrial fibrillation ([place])    Hypertension    Thyroid disease     Past Surgical History:  Procedure Laterality Date   CHOLECYSTECTOMY     GALLBLADDER SURGERY     Family History:  Family History  Problem Relation Age of Onset   COPD Mother    Atrial fibrillation Mother    Brain cancer Father    Family Psychiatric  History: See previous Social History:  Social History   Substance and Sexual Activity  Alcohol Use Not Currently     Social History   Substance and Sexual Activity  Drug Use Never    Social History   Socioeconomic History   Marital status: Single    Spouse name: Not on file   Number of children: Not on file   Years of education: Not on file   Highest education level: Not on file  Occupational  History   Not on file  Tobacco Use   Smoking status: Never   Smokeless tobacco: Never  Vaping Use   Vaping Use: Never used  Substance and Sexual Activity   Alcohol use: Not Currently   Drug use: Never   Sexual activity: Not Currently  Other Topics Concern   Not on file  Social History Narrative   Not on file   Social Determinants of Health   Financial Resource Strain: Low Risk  (07/23/2022)   Overall Financial Resource Strain (CARDIA)    Difficulty of Paying Living Expenses: Not hard at all  Food Insecurity: No Food Insecurity (11/15/2022)   Hunger Vital Sign    Worried About Running Out of Food in the Last Year: Never true    [place] in the Last Year: Never true  Transportation Needs: No Transportation Needs (11/15/2022)   PRAPARE - Hydrologist (Medical): No    Lack of Transportation (Non-Medical): No  Physical Activity: Insufficiently Active (07/23/2022)   Exercise Vital Sign    Days of Exercise per Week: 5 days    Minutes of Exercise per Session: 10 min  Stress: No Stress Concern Present (07/23/2022)   [place]    Feeling of Stress : Only a little  Social Connections: Socially Isolated (07/23/2022)   Social Connection and Isolation Panel [NHANES]    Frequency of Communication with  Friends and Family: Once a week    Frequency of Social Gatherings with Friends and Family: Once a week    Attends Religious Services: Never    Printmaker: No    Attends Music therapist: 1 to 4 times per year    Marital Status: Never married   Additional Social History:                         Sleep: Fair  Appetite:  Fair  Current Medications: Current Facility-Administered Medications  Medication Dose Route Frequency Provider Last Rate Last Admin   acetaminophen (TYLENOL) tablet 650 mg  650 mg Oral Q6H PRN Caroline Sauger, NP    650 mg at 11/21/22 0813   alum & mag hydroxide-simeth (MAALOX/MYLANTA) 200-200-20 MG/5ML suspension 30 mL  30 mL Oral Q4H PRN Caroline Sauger, NP       haloperidol (HALDOL) tablet 5 mg  5 mg Oral Q6H PRN Caroline Sauger, NP       And   benztropine (COGENTIN) tablet 1 mg  1 mg Oral Q6H PRN Caroline Sauger, NP       busPIRone (BUSPAR) tablet 15 mg  15 mg Oral BID Sudie Bandel, Madie Reno, MD   15 mg at 11/21/22 0809   diazepam (VALIUM) injection 5 mg  5 mg Intramuscular Q4H PRN Jahrel Borthwick, Madie Reno, MD       digoxin (LANOXIN) tablet 250 mcg  250 mcg Oral Daily Caroline Sauger, NP   250 mcg at 11/20/22 [id number]   diphenhydrAMINE (BENADRYL) capsule 50 mg  50 mg Oral Q6H PRN Gregor Dershem, Madie Reno, MD   50 mg at 11/21/22 [id number]   Or   diphenhydrAMINE (BENADRYL) injection 50 mg  50 mg Intramuscular Q6H PRN Sheral Pfahler, Madie Reno, MD       haloperidol (HALDOL) tablet 5 mg  5 mg Oral Q6H PRN Porschia Willbanks, Madie Reno, MD   5 mg at 11/19/22 1952   Or   haloperidol lactate (HALDOL) injection 5 mg  5 mg Intramuscular Q6H PRN Ellsworth Waldschmidt, Madie Reno, MD       levothyroxine (SYNTHROID) tablet 75 mcg  75 mcg Oral Q0600 Caroline Sauger, NP   75 mcg at 11/21/22 [id number]   lithium carbonate capsule 300 mg  300 mg Oral BID WC Noorah Giammona, Madie Reno, MD       risperiDONE (RISPERDAL M-TABS) disintegrating tablet 2 mg  2 mg Oral Q8H PRN Caroline Sauger, NP   2 mg at 11/17/22 1146   And   LORazepam (ATIVAN) tablet 1 mg  1 mg Oral PRN Caroline Sauger, NP       risperiDONE (RISPERDAL M-TABS) disintegrating tablet 2 mg  2 mg Oral Q8H PRN Caroline Sauger, NP       And   LORazepam (ATIVAN) tablet 1 mg  1 mg Oral PRN Caroline Sauger, NP       LORazepam (ATIVAN) tablet 2 mg  2 mg Oral Q4H PRN Kavir Savoca T, MD   2 mg at 11/19/22 1952   magnesium hydroxide (MILK OF MAGNESIA) suspension 30 mL  30 mL Oral Daily PRN Caroline Sauger, NP       metoprolol succinate (TOPROL-XL) 24 hr tablet 50 mg  50 mg Oral QHS Caroline Sauger, NP    50 mg at 11/19/22 2106   midodrine (PROAMATINE) tablet 5 mg  5 mg Oral BID WC Caroline Sauger, NP   5 mg at 11/21/22 0809   Oxcarbazepine (TRILEPTAL) tablet 600  mg  600 mg Oral BID Caroline Sauger, NP   600 mg at 11/21/22 0809   QUEtiapine (SEROQUEL) tablet 100 mg  100 mg Oral Daily Sophia Cubero, Madie Reno, MD   100 mg at 11/21/22 0810   QUEtiapine (SEROQUEL) tablet 400 mg  400 mg Oral QHS Margarett Viti T, MD   400 mg at 11/20/22 2121   ramelteon (ROZEREM) tablet 8 mg  8 mg Oral QHS Caroline Sauger, NP   8 mg at 11/19/22 2106   risperiDONE (RISPERDAL M-TABS) disintegrating tablet 1.5 mg  1.5 mg Oral QHS Dolores Ewing T, MD   1.5 mg at 11/19/22 2105   rivaroxaban (XARELTO) tablet 20 mg  20 mg Oral q morning Caroline Sauger, NP   20 mg at 11/21/22 0810   temazepam (RESTORIL) capsule 15 mg  15 mg Oral QHS PRN Casyn Becvar, Madie Reno, MD   15 mg at 11/18/22 2135    Lab Results:  Results for orders placed or performed during the hospital encounter of 11/15/22 (from the past 48 hour(s))  Digoxin level     Status: None   Collection Time: 11/20/22 10:51 AM  Result Value Ref Range   Digoxin Level 1.0 0.8 - 2.0 ng/mL    Comment: Performed at [facility], [address] [place], [place] [address]    Blood Alcohol level:  Lab Results  Component Value Date   [facility] <10 [id number]    Metabolic Disorder Labs: Lab Results  Component Value Date   HGBA1C 5.8 (H) 11/17/2022   MPG 120 11/17/2022   MPG 117 07/23/2022   No results found for: "PROLACTIN" Lab Results  Component Value Date   CHOL 129 11/17/2022   TRIG 81 11/17/2022   HDL 65 11/17/2022   CHOLHDL 2.0 11/17/2022   VLDL 16 11/17/2022   LDLCALC 48 11/17/2022   LDLCALC 87 07/23/2022    Physical Findings: AIMS:  , ,  ,  ,    CIWA:    COWS:     Musculoskeletal: Strength & Muscle Tone: within normal limits Gait & Station: normal Patient leans: N/A  Psychiatric Specialty Exam:  Presentation  General Appearance:   Bizarre  Eye Contact: Minimal  Speech: Pressured; Clear and Coherent  Speech Volume: Increased  Handedness: Right   Mood and Affect  Mood: Angry; Irritable  Affect: Inappropriate; Full Range   Thought Process  Thought Processes: Coherent  Descriptions of Associations:Circumstantial  Orientation:Full (Time, Place and Person)  Thought Content:Logical; Obsessions  History of Schizophrenia/Schizoaffective disorder:No  Duration of Psychotic Symptoms:Less than six months  Hallucinations:No data recorded Ideas of Reference:None  Suicidal Thoughts:No data recorded Homicidal Thoughts:No data recorded  Sensorium  Memory: Immediate Good; Recent Good; Remote Good  Judgment: Poor  Insight: Fair   Community education officer  Concentration: Poor  Attention Span: Poor  Recall: Good  Fund of Knowledge: Good  Language: Good   Psychomotor Activity  Psychomotor Activity:No data recorded  Assets  Assets: Communication Skills; Desire for Improvement; Social Support; Resilience; Physical Health   Sleep  Sleep:No data recorded   Physical Exam: Physical Exam Vitals and nursing note reviewed.  Constitutional:      Appearance: Normal appearance.  HENT:     Head: Normocephalic and atraumatic.     Mouth/Throat:     Pharynx: Oropharynx is clear.  Eyes:     Pupils: Pupils are equal, round, and reactive to light.  Cardiovascular:     Rate and Rhythm: Normal rate and regular rhythm.  Pulmonary:  Effort: Pulmonary effort is normal.     Breath sounds: Normal breath sounds.  Abdominal:     General: Abdomen is flat.     Palpations: Abdomen is soft.  Musculoskeletal:        General: Normal range of motion.  Skin:    General: Skin is warm and dry.  Neurological:     General: No focal deficit present.     Mental Status: He is alert. Mental status is at baseline.  Psychiatric:        Attention and Perception: He is inattentive.        Mood and  Affect: Mood is anxious. Affect is blunt.        Speech: Speech is tangential.        Behavior: Behavior is withdrawn.        Thought Content: Thought content is delusional.        Cognition and Memory: Cognition is impaired. Memory is impaired.        Judgment: Judgment is inappropriate.    Review of Systems  Constitutional: Negative.   HENT: Negative.    Eyes: Negative.   Respiratory: Negative.    Cardiovascular: Negative.   Gastrointestinal: Negative.   Musculoskeletal: Negative.   Skin: Negative.   Neurological: Negative.   Psychiatric/Behavioral:  The patient is nervous/anxious.    Blood pressure 115/69, pulse 91, temperature 97.7 F (36.5 C), temperature source Oral, resp. rate 16, height '6\' 5"'$  (1.956 m), weight 102.1 kg, SpO2 100 %. Body mass index is 26.68 kg/m.   Treatment Plan Summary: Medication management and Plan spoke with patient today and explained once again the rationale for both medicines.  Dr. Marcelline Deist had suggested that if we started lithium we might discontinue the digoxin but has not done it yet and since he has not started the lithium I will hold off on making that change and just ask him to take both medicines for now which he has agreed to do.  Alethia Berthold, MD 11/21/2022, 10:03 AM

## 2022-11-21 NOTE — Plan of Care (Signed)
Pt endorses anxiety however denies depression at this time. Pt denies SI/HI/AVH or pain at this time. Pt is calm and cooperative. Pt is medication compliant. Pt provided with support and encouragement. Pt monitored q15 minutes for safety per unit policy. Plan of care ongoing.   Pt stated he took his medications for today. Pt informed he had not taken his evening medication yet, and asked if he was going to come take them. Pt refused again to take schedule evening medications metoprolol 50 mg, quetiapine 400 mg, ramelteon 8 mg, risperidone 1.5 mg. MD notified.   Problem: Pain Managment: Goal: General experience of comfort will improve Outcome: Progressing   Problem: Coping: Goal: Level of anxiety will decrease Outcome: Not Progressing

## 2022-11-21 NOTE — Progress Notes (Signed)
Patient refused Meteprolol, Ramelteon and Risperdal and took Seroquel only stating that's what he needs only this today, Patient also stated that he will not be taking Lithium as "my Doctor from [facility] told me not to take it because of his Kidney"  Support and encouragement provided.

## 2022-11-21 NOTE — Progress Notes (Signed)
Patient refused some of his scheduled medication. MD was notified during progression rounds.

## 2022-11-21 NOTE — Plan of Care (Signed)
D- Patient alert and oriented. Patient presented in an anxious, preoccupied, but pleasant mood on assessment stating that he slept "pretty good" last night and had some complaints of constipation, but he stated that he doesn't want any PRN medication until tonight. Patient has been preoccupied with a conference call from his Mother. Patient endorsed both depression/anxiety stating that he is waiting for some paperwork to be sent here for his job. Patient stated "I feel like I'm having a midlife crisis. I may need anger management. I have many questions and answers that I need to go through. I feel like everything's been taken care of". Patient denied SI, HI, AVH, and pain at this time. Patient had no stated goals for today. Patient has to be redirected at times, back to his room, because he gets a little confused of where to go.  A- Scheduled medications administered to patient, per MD orders. Support and encouragement provided.  Routine safety checks conducted every 15 minutes.  Patient informed to notify staff with problems or concerns.  R- No adverse drug reactions noted. Patient contracts for safety at this time. Patient compliant with medications and treatment plan. Patient receptive, calm, and cooperative. Patient interacts well with others on the unit. Patient remains safe at this time.  Problem: Education: Goal: Knowledge of General Education information will improve Description: Including pain rating scale, medication(s)/side effects and non-pharmacologic comfort measures Outcome: Not Progressing   Problem: Health Behavior/Discharge Planning: Goal: Ability to manage health-related needs will improve Outcome: Not Progressing   Problem: Clinical Measurements: Goal: Ability to maintain clinical measurements within normal limits will improve Outcome: Not Progressing Goal: Will remain free from infection Outcome: Not Progressing Goal: Diagnostic test results will improve Outcome: Not  Progressing Goal: Respiratory complications will improve Outcome: Not Progressing Goal: Cardiovascular complication will be avoided Outcome: Not Progressing   Problem: Activity: Goal: Risk for activity intolerance will decrease Outcome: Not Progressing   Problem: Nutrition: Goal: Adequate nutrition will be maintained Outcome: Not Progressing   Problem: Coping: Goal: Level of anxiety will decrease Outcome: Not Progressing   Problem: Elimination: Goal: Will not experience complications related to bowel motility Outcome: Not Progressing Goal: Will not experience complications related to urinary retention Outcome: Not Progressing   Problem: Pain Managment: Goal: General experience of comfort will improve Outcome: Not Progressing   Problem: Safety: Goal: Ability to remain free from injury will improve Outcome: Not Progressing   Problem: Skin Integrity: Goal: Risk for impaired skin integrity will decrease Outcome: Not Progressing

## 2022-11-21 NOTE — Group Note (Signed)
Recreation Therapy Group Note   Group Topic:Other  Group Date: 11/21/2022 Start Time: 1000 End Time: 1001 Facilitators: Vilma Prader, LRT, CTRS Location:  N/A  Group was not held due to LRT attending Fit Testing Trainer Class.    Vilma Prader, LRT, CTRS 11/21/2022 11:32 AM

## 2022-11-21 NOTE — Group Note (Signed)
[facility] LCSW Group Therapy Note   Group Date: 11/21/2022 Start Time: 1300 End Time: 1400   Type of Therapy/Topic:  Group Therapy:  Emotion Regulation  Participation Level:  Active   Mood:  Description of Group:    The purpose of this group is to assist patients in learning to regulate negative emotions and experience positive emotions. Patients will be guided to discuss ways in which they have been vulnerable to their negative emotions. These vulnerabilities will be juxtaposed with experiences of positive emotions or situations, and patients challenged to use positive emotions to combat negative ones. Special emphasis will be placed on coping with negative emotions in conflict situations, and patients will process healthy conflict resolution skills.  Therapeutic Goals: Patient will identify two positive emotions or experiences to reflect on in order to balance out negative emotions:  Patient will label two or more emotions that they find the most difficult to experience:  Patient will be able to demonstrate positive conflict resolution skills through discussion or role plays:   Summary of Patient Progress: Patient was present for the entirety of the group process. He shared that his life has been touched by death a lot of the past several years and acknowledged some issues around grief. Pt shared that in the past he kicked a refrigerator because he was angry about the outcome of a sporting event. 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 Pt stated that he typically helps anyone find what they were looking for but in that situation he could not and he had to hold it in. Insight into topic is questionable. However, he did appear open and receptive to feedback/comments from both peers and facilitator.    Therapeutic Modalities:   Cognitive Behavioral Therapy Feelings  Identification Dialectical Behavioral Therapy   Shirl Harris, LCSW

## 2022-11-21 NOTE — BH IP Treatment Plan (Signed)
Interdisciplinary Treatment and Diagnostic Plan Update  11/21/2022 Time of Session: 0830 EVERTON FORSTNER MRN: [id number]  Principal Diagnosis: Schizoaffective disorder, bipolar type [facility])  Secondary Diagnoses: Principal Problem:   Schizoaffective disorder, bipolar type ([place]) Active Problems:   Hypothyroidism, unspecified   Unspecified atrial fibrillation ([place])   Personal history of pulmonary embolism   Bilateral edema of lower extremity   Chronic systolic heart failure ([place])   Bipolar 1 disorder ([employer])   Current Medications:  Current Facility-Administered Medications  Medication Dose Route Frequency Provider Last Rate Last Admin   acetaminophen (TYLENOL) tablet 650 mg  650 mg Oral Q6H PRN Caroline Sauger, NP   650 mg at 11/21/22 0813   alum & mag hydroxide-simeth (MAALOX/MYLANTA) 200-200-20 MG/5ML suspension 30 mL  30 mL Oral Q4H PRN Caroline Sauger, NP       haloperidol (HALDOL) tablet 5 mg  5 mg Oral Q6H PRN Caroline Sauger, NP       And   benztropine (COGENTIN) tablet 1 mg  1 mg Oral Q6H PRN Caroline Sauger, NP       busPIRone (BUSPAR) tablet 15 mg  15 mg Oral BID Clapacs, Madie Reno, MD   15 mg at 11/21/22 0809   diazepam (VALIUM) injection 5 mg  5 mg Intramuscular Q4H PRN Clapacs, Madie Reno, MD       digoxin (LANOXIN) tablet 250 mcg  250 mcg Oral Daily Caroline Sauger, NP   250 mcg at 11/20/22 [id number]   diphenhydrAMINE (BENADRYL) capsule 50 mg  50 mg Oral Q6H PRN Clapacs, Madie Reno, MD   50 mg at 11/21/22 [id number]   Or   diphenhydrAMINE (BENADRYL) injection 50 mg  50 mg Intramuscular Q6H PRN Clapacs, Madie Reno, MD       haloperidol (HALDOL) tablet 5 mg  5 mg Oral Q6H PRN Clapacs, Madie Reno, MD   5 mg at 11/19/22 1952   Or   haloperidol lactate (HALDOL) injection 5 mg  5 mg Intramuscular Q6H PRN Clapacs, Madie Reno, MD       levothyroxine (SYNTHROID) tablet 75 mcg  75 mcg Oral Q0600 Caroline Sauger, NP   75 mcg at 11/21/22 [id number]   lithium carbonate capsule 300 mg  300 mg Oral  BID WC Clapacs, Madie Reno, MD       risperiDONE (RISPERDAL M-TABS) disintegrating tablet 2 mg  2 mg Oral Q8H PRN Caroline Sauger, NP   2 mg at 11/17/22 1146   And   LORazepam (ATIVAN) tablet 1 mg  1 mg Oral PRN Caroline Sauger, NP       risperiDONE (RISPERDAL M-TABS) disintegrating tablet 2 mg  2 mg Oral Q8H PRN Caroline Sauger, NP       And   LORazepam (ATIVAN) tablet 1 mg  1 mg Oral PRN Caroline Sauger, NP       LORazepam (ATIVAN) tablet 2 mg  2 mg Oral Q4H PRN Clapacs, John T, MD   2 mg at 11/19/22 1952   magnesium hydroxide (MILK OF MAGNESIA) suspension 30 mL  30 mL Oral Daily PRN Caroline Sauger, NP       metoprolol succinate (TOPROL-XL) 24 hr tablet 50 mg  50 mg Oral QHS Caroline Sauger, NP   50 mg at 11/19/22 2106   midodrine (PROAMATINE) tablet 5 mg  5 mg Oral BID WC Caroline Sauger, NP   5 mg at 11/21/22 0809   Oxcarbazepine (TRILEPTAL) tablet 600 mg  600 mg Oral BID Caroline Sauger, NP   600 mg at 11/21/22 [phone]  QUEtiapine (SEROQUEL) tablet 100 mg  100 mg Oral Daily Clapacs, John T, MD   100 mg at 11/21/22 0810   QUEtiapine (SEROQUEL) tablet 400 mg  400 mg Oral QHS Clapacs, John T, MD   400 mg at 11/20/22 2121   ramelteon (ROZEREM) tablet 8 mg  8 mg Oral QHS Caroline Sauger, NP   8 mg at 11/19/22 2106   risperiDONE (RISPERDAL M-TABS) disintegrating tablet 1.5 mg  1.5 mg Oral QHS Clapacs, John T, MD   1.5 mg at 11/19/22 2105   rivaroxaban (XARELTO) tablet 20 mg  20 mg Oral q morning Caroline Sauger, NP   20 mg at 11/21/22 0810   temazepam (RESTORIL) capsule 15 mg  15 mg Oral QHS PRN Clapacs, Madie Reno, MD   15 mg at 11/18/22 2135   PTA Medications: Medications Prior to Admission  Medication Sig Dispense Refill Last Dose   busPIRone (BUSPAR) 15 MG tablet Take 30 mg by mouth 2 (two) times daily.      digoxin (LANOXIN) 0.25 MG tablet Take 250 mcg by mouth daily.      levothyroxine (SYNTHROID) 75 MCG tablet Take 1 tablet (75 mcg total) by  mouth daily. 90 tablet 3    metoprolol succinate (TOPROL-XL) 50 MG 24 hr tablet Take 50 mg by mouth at bedtime.      midodrine (PROAMATINE) 5 MG tablet Take 5 mg by mouth 2 (two) times daily with a meal.      mirtazapine (REMERON) 30 MG tablet Take 30 mg by mouth at bedtime.      mupirocin ointment (BACTROBAN) 2 % Apply 1 Application topically 2 (two) times daily. 22 g 0    oxcarbazepine (TRILEPTAL) 600 MG tablet Take 600 mg by mouth 2 (two) times daily.      ramelteon (ROZEREM) 8 MG tablet Take 8 mg by mouth at bedtime.      risperiDONE (RISPERDAL M-TABS) 0.5 MG disintegrating tablet Take 0.5 mg by mouth daily.      XARELTO 20 MG TABS tablet Take 20 mg by mouth every morning.       Patient Stressors: Medication change or noncompliance   Traumatic event    Patient Strengths: Motivation for treatment/growth  Supportive family/friends   Treatment Modalities: Medication Management, Group therapy, Case management,  1 to 1 session with clinician, Psychoeducation, Recreational therapy.   Physician Treatment Plan for Primary Diagnosis: Schizoaffective disorder, bipolar type ([place]) Long Term Goal(s): Improvement in symptoms so as ready for discharge   Short Term Goals: Ability to maintain clinical measurements within normal limits will improve Compliance with prescribed medications will improve Ability to verbalize feelings will improve Ability to demonstrate self-control will improve Ability to identify and develop effective coping behaviors will improve  Medication Management: Evaluate patient's response, side effects, and tolerance of medication regimen.  Therapeutic Interventions: 1 to 1 sessions, Unit Group sessions and Medication administration.  Evaluation of Outcomes: Progressing  Physician Treatment Plan for Secondary Diagnosis: Principal Problem:   Schizoaffective disorder, bipolar type ([place]) Active Problems:   Hypothyroidism, unspecified   Unspecified atrial fibrillation  ([place])   Personal history of pulmonary embolism   Bilateral edema of lower extremity   Chronic systolic heart failure ([place])   Bipolar 1 disorder ([facility])  Long Term Goal(s): Improvement in symptoms so as ready for discharge   Short Term Goals: Ability to maintain clinical measurements within normal limits will improve Compliance with prescribed medications will improve Ability to verbalize feelings will improve Ability to demonstrate self-control will  improve Ability to identify and develop effective coping behaviors will improve     Medication Management: Evaluate patient's response, side effects, and tolerance of medication regimen.  Therapeutic Interventions: 1 to 1 sessions, Unit Group sessions and Medication administration.  Evaluation of Outcomes: Progressing   RN Treatment Plan for Primary Diagnosis: Schizoaffective disorder, bipolar type ([place]) Long Term Goal(s): Knowledge of disease and therapeutic regimen to maintain health will improve  Short Term Goals: Ability to remain free from injury will improve, Ability to verbalize frustration and anger appropriately will improve, Ability to demonstrate self-control, Ability to participate in decision making will improve, Ability to verbalize feelings will improve, Ability to disclose and discuss suicidal ideas, Ability to identify and develop effective coping behaviors will improve, and Compliance with prescribed medications will improve  Medication Management: RN will administer medications as ordered by provider, will assess and evaluate patient's response and provide education to patient for prescribed medication. RN will report any adverse and/or side effects to prescribing provider.  Therapeutic Interventions: 1 on 1 counseling sessions, Psychoeducation, Medication administration, Evaluate responses to treatment, Monitor vital signs and CBGs as ordered, Perform/monitor CIWA, COWS, AIMS and Fall Risk screenings as ordered, Perform wound  care treatments as ordered.  Evaluation of Outcomes: Progressing   LCSW Treatment Plan for Primary Diagnosis: Schizoaffective disorder, bipolar type ([place]) Long Term Goal(s): Safe transition to appropriate next level of care at discharge, Engage patient in therapeutic group addressing interpersonal concerns.  Short Term Goals: Engage patient in aftercare planning with referrals and resources, Increase social support, Increase ability to appropriately verbalize feelings, Increase emotional regulation, Facilitate acceptance of mental health diagnosis and concerns, Facilitate patient progression through stages of change regarding substance use diagnoses and concerns, Identify triggers associated with mental health/substance abuse issues, and Increase skills for wellness and recovery  Therapeutic Interventions: Assess for all discharge needs, 1 to 1 time with Social worker, Explore available resources and support systems, Assess for adequacy in community support network, Educate family and significant other(s) on suicide prevention, Complete Psychosocial Assessment, Interpersonal group therapy.  Evaluation of Outcomes: Progressing   Progress in Treatment: Attending groups: Yes. Participating in groups: No. Taking medication as prescribed: Yes. Toleration medication: Yes. Family/Significant other contact made: Yes, individual(s) contacted:  Merrilee Seashore, mother, [phone] Patient understands diagnosis: Yes. Discussing patient identified problems/goals with staff: Yes. Medical problems stabilized or resolved: Yes. Denies suicidal/homicidal ideation: Yes. Issues/concerns per patient self-inventory: Yes. Other: none  New problem(s) identified: No, Describe:  none  New Short Term/Long Term Goal(s): Patient to work towards elimination of symptoms of psychosis, medication management for mood stabilization; development of comprehensive mental wellness plan.  Patient Goals: No additional goals  identified at this time. Patient to continue to work towards original goals identified in initial treatment team meeting. CSW will remain available to patient should they voice additional treatment goals.   Discharge Plan or Barriers: No psychosocial barriers identified at this time, patient to return to place of residence when appropriate for discharge.   Reason for Continuation of Hospitalization: Medication stabilization Other; describe psychosis   Estimated Length of Stay: 1-7 days   Scribe for Treatment Team: Larose Kells 11/21/2022 2:23 PM

## 2022-11-21 NOTE — Group Note (Signed)
Date:  11/21/2022 Time:  11:54 PM  Group Topic/Focus:  Making Healthy Choices:   The focus of this group is to help patients identify negative/unhealthy choices they were using prior to admission and identify positive/healthier coping strategies to replace them upon discharge. Wrap-Up Group:   The focus of this group is to help patients review their daily goal of treatment and discuss progress on daily workbooks.    Participation Level:  Active  Participation Quality:  Appropriate  Affect:  Appropriate  Cognitive:  Alert, Appropriate, and Oriented  Insight: Appropriate  Engagement in Group:  Engaged  Modes of Intervention:  Discussion  Additional Comments:    Ladona Mow 11/21/2022, 11:54 PM

## 2022-11-22 DIAGNOSIS — F250 Schizoaffective disorder, bipolar type: Secondary | ICD-10-CM | POA: Diagnosis not present

## 2022-11-22 MED ORDER — RISPERIDONE 1 MG PO TBDP
1.5000 mg | ORAL_TABLET | Freq: Every day | ORAL | Status: DC
  Administered 2022-11-22 – 2022-11-26 (×5): 1.5 mg via ORAL
  Filled 2022-11-22 (×4): qty 2

## 2022-11-22 MED ORDER — LEVOTHYROXINE SODIUM 50 MCG PO TABS
75.0000 ug | ORAL_TABLET | Freq: Every day | ORAL | Status: DC
  Administered 2022-11-23 – 2022-11-26 (×3): 75 ug via ORAL
  Filled 2022-11-22 (×4): qty 2

## 2022-11-22 MED ORDER — RIVAROXABAN 20 MG PO TABS
20.0000 mg | ORAL_TABLET | Freq: Every morning | ORAL | Status: DC
  Administered 2022-11-23 – 2022-12-03 (×11): 20 mg via ORAL
  Filled 2022-11-22 (×11): qty 1

## 2022-11-22 MED ORDER — METOPROLOL SUCCINATE ER 25 MG PO TB24
50.0000 mg | ORAL_TABLET | Freq: Every day | ORAL | Status: DC
  Administered 2022-11-23 – 2022-11-24 (×2): 50 mg via ORAL
  Filled 2022-11-22 (×2): qty 2

## 2022-11-22 MED ORDER — RAMELTEON 8 MG PO TABS
8.0000 mg | ORAL_TABLET | Freq: Every day | ORAL | Status: DC
  Administered 2022-11-22 – 2022-11-26 (×3): 8 mg via ORAL
  Filled 2022-11-22 (×6): qty 1

## 2022-11-22 MED ORDER — QUETIAPINE FUMARATE 200 MG PO TABS
400.0000 mg | ORAL_TABLET | Freq: Every day | ORAL | Status: DC
  Administered 2022-11-23 – 2022-11-26 (×4): 400 mg via ORAL
  Filled 2022-11-22 (×4): qty 2

## 2022-11-22 NOTE — Progress Notes (Signed)
   11/22/22 0612  Vitals  BP (!) 143/68  MAP (mmHg) 86  BP Method Automatic  Pulse Rate (!) 119  Pulse Rate Source Monitor  Oxygen Therapy  SpO2 100 %   Patient denies chest pain, shortness of breath, associated cardiac symptoms at this time. Patient recheck 123/72, pulse rate 79, spo2 99%. No acute distress noted. Patient remains safe on the unit.

## 2022-11-22 NOTE — Progress Notes (Signed)
Patient endorses increased anxiety this morning related to "filling out all this paperwork" and inability to sleep well throughout the night, per patient reports. Scheduled levothyroxine administered, per provider orders. PRN lorazepam administered per provider orders for anxiety, patient rated anxiety 10/10 at this time. Support and encouragement provided. Q15 minute safety checks maintained, patient remains safe on the unit.

## 2022-11-22 NOTE — Group Note (Signed)
[facility] LCSW Group Therapy Note    Group Date: 11/22/2022 Start Time: 1300 End Time: 1400  Type of Therapy and Topic:  Group Therapy:  Overcoming Obstacles  Participation Level:  [facility] PARTICIPATION LEVEL: Minimal  Description of Group:   In this group patients will be encouraged to explore what they see as obstacles to their own wellness and recovery. They will be guided to discuss their thoughts, feelings, and behaviors related to these obstacles. The group will process together ways to cope with barriers, with attention given to specific choices patients can make. Each patient will be challenged to identify changes they are motivated to make in order to overcome their obstacles. This group will be process-oriented, with patients participating in exploration of their own experiences as well as giving and receiving support and challenge from other group members.  Therapeutic Goals: 1. Patient will identify personal and current obstacles as they relate to admission. 2. Patient will identify barriers that currently interfere with their wellness or overcoming obstacles.  3. Patient will identify feelings, thought process and behaviors related to these barriers. 4. Patient will identify two changes they are willing to make to overcome these obstacles:    Summary of Patient Progress Patient was present for the entirety of group session. Patient participated in opening and closing remarks. However, patient did not contribute at all to the topic of discussion despite encouraged participation.   Therapeutic Modalities:   Cognitive Behavioral Therapy Solution Focused Therapy Motivational Interviewing Relapse Prevention Therapy   Durenda Hurt, [place]

## 2022-11-22 NOTE — Progress Notes (Addendum)
Patient stated that he does not want to take his Xarelto at this time, he is going to go back to his room and rest. Patient also had an incontinent episode, in which he did request fresh linens and a change of scrubs. MD will be notified.

## 2022-11-22 NOTE — Plan of Care (Signed)
D- Patient alert and oriented, but as the day goes on, he becomes to be somewhat confused about things. Patient presented in an anxious, preoccupied mood on assessment stating that he slept poorly last night because he "tossed and turned all night, kept getting interrupted with loud noises". Patient endorsed both depression and anxiety, rating them both a "10/10", stating that "the image is always there. Misery shouldn't dictate how I live". This Probation officer is unsure what patient is speaking of. Patient denied SI, HI, AVH, and pain at this time. Per his self-inventory, patient's stated goal for today is to "be patient".  A- Patient refused scheduled medications, per MD orders. Support and encouragement provided. Routine safety checks conducted every 15 minutes. Patient informed to notify staff with problems or concerns.  R- Patient contracts for safety at this time. Patient remains safe at this time.  Problem: Education: Goal: Knowledge of General Education information will improve Description: Including pain rating scale, medication(s)/side effects and non-pharmacologic comfort measures Outcome: Not Progressing   Problem: Health Behavior/Discharge Planning: Goal: Ability to manage health-related needs will improve Outcome: Not Progressing   Problem: Clinical Measurements: Goal: Ability to maintain clinical measurements within normal limits will improve Outcome: Not Progressing Goal: Will remain free from infection Outcome: Not Progressing Goal: Diagnostic test results will improve Outcome: Not Progressing Goal: Respiratory complications will improve Outcome: Not Progressing Goal: Cardiovascular complication will be avoided Outcome: Not Progressing   Problem: Activity: Goal: Risk for activity intolerance will decrease Outcome: Not Progressing   Problem: Nutrition: Goal: Adequate nutrition will be maintained Outcome: Not Progressing   Problem: Coping: Goal: Level of anxiety will  decrease Outcome: Not Progressing   Problem: Elimination: Goal: Will not experience complications related to bowel motility Outcome: Not Progressing Goal: Will not experience complications related to urinary retention Outcome: Not Progressing   Problem: Pain Managment: Goal: General experience of comfort will improve Outcome: Not Progressing   Problem: Safety: Goal: Ability to remain free from injury will improve Outcome: Not Progressing   Problem: Skin Integrity: Goal: Risk for impaired skin integrity will decrease Outcome: Not Progressing

## 2022-11-22 NOTE — Group Note (Signed)
Recreation Therapy Group Note   Group Topic:Emotion Expression  Group Date: 11/22/2022 Start Time: 1000 End Time: 1100 Facilitators: Vilma Prader, LRT, CTRS Location:  Craft Room  Group Description: Gratitude Journaling. Patients and LRT discussed what gratitude means, how we can express it and what it means to [place], personally. LRT gave an education handout on the definition of gratitude that also gave different examples of gratitude exercises that they could try. One of the examples was "Gratitude Letter", which prompted you to write a letter to someone you appreciate. LRT played soft music while everyone wrote their letter. Once letter was completed, LRT encouraged people to read their letter, if they wanted to, or share who they wrote it to, at minimum. LRT and pts processed how showing gratitude towards themselves, and others can be applied to everyday life post-discharge.   Affect/Mood: N/A   Participation Level: Did not attend    Clinical Observations/Individualized Feedback: Curtis Hodges did not attend group to resting in room.  Plan: Continue to engage patient in RT group sessions 2-3x/week.   Vilma Prader, LRT, CTRS 11/22/2022 11:38 AM

## 2022-11-22 NOTE — Progress Notes (Signed)
Patient told Reggie, MHT that his Afib is acting up and he needs his medicine. Patient only asked for scheduled Midodrine, stating that he does not want any of his other medication until bedtime. MD was notified via secure chat and is aware.

## 2022-11-22 NOTE — Progress Notes (Signed)
Patient has refused to take majority of his medication stating "I already took that at 6 this morning". Staff was trying to explain to patient that those were different medications. Patient did not understand what was being explained to him. He left most of his medicine in the cup and walked off. MD was notified during progression rounds.

## 2022-11-22 NOTE — Progress Notes (Signed)
[facility] MD Progress Note  11/22/2022 1:48 PM Curtis Hodges  MRN:  [id number] Subjective: Follow-up patient with schizoaffective disorder.  Patient is slightly improved but remains pretty disorganized.  When I meet with him he will tell me that he will take his medicine but then he keeps refusing his medicine on and off at times.  Still disorganized and seems delusional in his thinking.  Limited self-care.  Not violent or threatening not suicidal. Principal Problem: Schizoaffective disorder, bipolar type ([place]) Diagnosis: Principal Problem:   Schizoaffective disorder, bipolar type ([place]) Active Problems:   Hypothyroidism, unspecified   Unspecified atrial fibrillation ([place])   Personal history of pulmonary embolism   Bilateral edema of lower extremity   Chronic systolic heart failure (HCC)   Bipolar 1 disorder (HCC)  Total Time spent with patient: 30 minutes  Past Psychiatric History: Past history of what I think is properly schizoaffective disorder  Past Medical History:  Past Medical History:  Diagnosis Date   Atrial fibrillation ([place])    Hypertension    Thyroid disease     Past Surgical History:  Procedure Laterality Date   CHOLECYSTECTOMY     GALLBLADDER SURGERY     Family History:  Family History  Problem Relation Age of Onset   COPD Mother    Atrial fibrillation Mother    Brain cancer Father    Family Psychiatric  History: See previous Social History:  Social History   Substance and Sexual Activity  Alcohol Use Not Currently     Social History   Substance and Sexual Activity  Drug Use Never    Social History   Socioeconomic History   Marital status: Single    Spouse name: Not on file   Number of children: Not on file   Years of education: Not on file   Highest education level: Not on file  Occupational History   Not on file  Tobacco Use   Smoking status: Never   Smokeless tobacco: Never  Vaping Use   Vaping Use: Never used  Substance and Sexual Activity    Alcohol use: Not Currently   Drug use: Never   Sexual activity: Not Currently  Other Topics Concern   Not on file  Social History Narrative   Not on file   Social Determinants of Health   Financial Resource Strain: Low Risk  (07/23/2022)   Overall Financial Resource Strain (CARDIA)    Difficulty of Paying Living Expenses: Not hard at all  Food Insecurity: No Food Insecurity (11/15/2022)   Hunger Vital Sign    Worried About Running Out of Food in the Last Year: Never true    Ran Out of Food in the Last Year: Never true  Transportation Needs: No Transportation Needs (11/15/2022)   PRAPARE - Hydrologist (Medical): No    Lack of Transportation (Non-Medical): No  Physical Activity: Insufficiently Active (07/23/2022)   Exercise Vital Sign    Days of Exercise per Week: 5 days    Minutes of Exercise per Session: 10 min  Stress: No Stress Concern Present (07/23/2022)   [place]    Feeling of Stress : Only a little  Social Connections: Socially Isolated (07/23/2022)   Social Connection and Isolation Panel [NHANES]    Frequency of Communication with Friends and Family: Once a week    Frequency of Social Gatherings with Friends and Family: Once a week    Attends Religious Services: Never  Active Member of Clubs or Organizations: No    Attends Archivist Meetings: 1 to 4 times per year    Marital Status: Never married   Additional Social History:                         Sleep: Fair  Appetite:  Fair  Current Medications: Current Facility-Administered Medications  Medication Dose Route Frequency Provider Last Rate Last Admin   acetaminophen (TYLENOL) tablet 650 mg  650 mg Oral Q6H PRN Caroline Sauger, NP   650 mg at 11/21/22 0813   alum & mag hydroxide-simeth (MAALOX/MYLANTA) 200-200-20 MG/5ML suspension 30 mL  30 mL Oral Q4H PRN Caroline Sauger, NP        haloperidol (HALDOL) tablet 5 mg  5 mg Oral Q6H PRN Caroline Sauger, NP       And   benztropine (COGENTIN) tablet 1 mg  1 mg Oral Q6H PRN Caroline Sauger, NP       busPIRone (BUSPAR) tablet 15 mg  15 mg Oral BID Dariel Betzer T, MD   15 mg at 11/21/22 1721   diazepam (VALIUM) injection 5 mg  5 mg Intramuscular Q4H PRN Gwendlyn Hanback, Madie Reno, MD       digoxin (LANOXIN) tablet 250 mcg  250 mcg Oral Daily Caroline Sauger, NP   250 mcg at 11/20/22 [id number]   diphenhydrAMINE (BENADRYL) capsule 50 mg  50 mg Oral Q6H PRN Alecxis Baltzell T, MD   50 mg at 11/21/22 [id number]   Or   diphenhydrAMINE (BENADRYL) injection 50 mg  50 mg Intramuscular Q6H PRN Palmyra Rogacki T, MD       haloperidol (HALDOL) tablet 5 mg  5 mg Oral Q6H PRN Sabrea Sankey T, MD   5 mg at 11/19/22 1952   Or   haloperidol lactate (HALDOL) injection 5 mg  5 mg Intramuscular Q6H PRN Zori Benbrook T, MD       levothyroxine (SYNTHROID) tablet 75 mcg  75 mcg Oral Q0600 Caroline Sauger, NP   75 mcg at 11/22/22 0630   lithium carbonate capsule 300 mg  300 mg Oral BID WC Lyanne Kates T, MD   300 mg at 11/21/22 1721   risperiDONE (RISPERDAL M-TABS) disintegrating tablet 2 mg  2 mg Oral Q8H PRN Caroline Sauger, NP   2 mg at 11/17/22 1146   And   LORazepam (ATIVAN) tablet 1 mg  1 mg Oral PRN Caroline Sauger, NP       LORazepam (ATIVAN) tablet 2 mg  2 mg Oral Q4H PRN Haywood Meinders T, MD   2 mg at 11/19/22 1952   magnesium hydroxide (MILK OF MAGNESIA) suspension 30 mL  30 mL Oral Daily PRN Caroline Sauger, NP       metoprolol succinate (TOPROL-XL) 24 hr tablet 50 mg  50 mg Oral QHS Caroline Sauger, NP   50 mg at 11/19/22 2106   midodrine (PROAMATINE) tablet 5 mg  5 mg Oral BID WC Caroline Sauger, NP   5 mg at 11/21/22 1721   Oxcarbazepine (TRILEPTAL) tablet 600 mg  600 mg Oral BID Caroline Sauger, NP   600 mg at 11/21/22 1722   QUEtiapine (SEROQUEL) tablet 100 mg  100 mg Oral Daily Connar Keating T, MD   100 mg at  11/21/22 0810   QUEtiapine (SEROQUEL) tablet 400 mg  400 mg Oral QHS Ranen Doolin T, MD   400 mg at 11/20/22 2121   ramelteon (ROZEREM) tablet 8 mg  8  mg Oral QHS Caroline Sauger, NP   8 mg at 11/19/22 2106   risperiDONE (RISPERDAL M-TABS) disintegrating tablet 1.5 mg  1.5 mg Oral QHS Anallely Rosell T, MD   1.5 mg at 11/19/22 2105   risperiDONE (RISPERDAL M-TABS) disintegrating tablet 2 mg  2 mg Oral Q8H PRN Caroline Sauger, NP       rivaroxaban Alveda Reasons) tablet 20 mg  20 mg Oral q morning Caroline Sauger, NP   20 mg at 11/22/22 1100   temazepam (RESTORIL) capsule 15 mg  15 mg Oral QHS PRN Detta Mellin, Madie Reno, MD   15 mg at 11/18/22 2135    Lab Results:  Results for orders placed or performed during the hospital encounter of 11/15/22 (from the past 48 hour(s))  CBC     Status: None   Collection Time: 11/21/22 10:01 AM  Result Value Ref Range   WBC 8.5 4.0 - 10.5 K/uL   RBC 4.69 4.22 - 5.81 MIL/uL   Hemoglobin 14.9 13.0 - 17.0 g/dL   HCT 45.0 39.0 - 52.0 %   MCV 95.9 80.0 - 100.0 fL   MCH 31.8 26.0 - 34.0 pg   MCHC 33.1 30.0 - 36.0 g/dL   RDW 13.2 11.5 - 15.5 %   Platelets 297 150 - 400 K/uL   nRBC 0.0 0.0 - 0.2 %    Comment: Performed at [facility], [address] [place], [place] [address]    Blood Alcohol level:  Lab Results  Component Value Date   [facility] <10 [id number]    Metabolic Disorder Labs: Lab Results  Component Value Date   HGBA1C 5.8 (H) 11/17/2022   MPG 120 11/17/2022   MPG 117 07/23/2022   No results found for: "PROLACTIN" Lab Results  Component Value Date   CHOL 129 11/17/2022   TRIG 81 11/17/2022   HDL 65 11/17/2022   CHOLHDL 2.0 11/17/2022   VLDL 16 11/17/2022   LDLCALC 48 11/17/2022   LDLCALC 87 07/23/2022    Physical Findings: AIMS:  , ,  ,  ,    CIWA:    COWS:     Musculoskeletal: Strength & Muscle Tone: within normal limits Gait & Station: normal Patient leans: N/A  Psychiatric Specialty Exam:  Presentation   General Appearance:  Bizarre  Eye Contact: Minimal  Speech: Pressured; Clear and Coherent  Speech Volume: Increased  Handedness: Right   Mood and Affect  Mood: Angry; Irritable  Affect: Inappropriate; Full Range   Thought Process  Thought Processes: Coherent  Descriptions of Associations:Circumstantial  Orientation:Full (Time, Place and Person)  Thought Content:Logical; Obsessions  History of Schizophrenia/Schizoaffective disorder:No  Duration of Psychotic Symptoms:Less than six months  Hallucinations:No data recorded Ideas of Reference:None  Suicidal Thoughts:No data recorded Homicidal Thoughts:No data recorded  Sensorium  Memory: Immediate Good; Recent Good; Remote Good  Judgment: Poor  Insight: Fair   Community education officer  Concentration: Poor  Attention Span: Poor  Recall: Good  Fund of Knowledge: Good  Language: Good   Psychomotor Activity  Psychomotor Activity:No data recorded  Assets  Assets: Communication Skills; Desire for Improvement; Social Support; Resilience; Physical Health   Sleep  Sleep:No data recorded   Physical Exam: Physical Exam Vitals and nursing note reviewed.  Constitutional:      Appearance: He is ill-appearing.  HENT:     Head: Normocephalic and atraumatic.     Mouth/Throat:     Pharynx: Oropharynx is clear.  Eyes:     Pupils: Pupils are equal, round, and reactive  to light.  Cardiovascular:     Rate and Rhythm: Normal rate and regular rhythm.  Pulmonary:     Effort: Pulmonary effort is normal.     Breath sounds: Normal breath sounds.  Abdominal:     General: Abdomen is flat.     Palpations: Abdomen is soft.  Musculoskeletal:        General: Normal range of motion.  Skin:    General: Skin is warm and dry.  Neurological:     General: No focal deficit present.     Mental Status: He is alert. Mental status is at baseline.  Psychiatric:        Attention and Perception: He is  inattentive.        Mood and Affect: Mood normal. Affect is blunt.        Speech: Speech is tangential.        Behavior: Behavior is agitated. Behavior is not aggressive.        Thought Content: Thought content is delusional.        Cognition and Memory: Cognition is impaired. Memory is impaired.    Review of Systems  Constitutional: Negative.   HENT: Negative.    Eyes: Negative.   Respiratory: Negative.    Cardiovascular: Negative.   Gastrointestinal: Negative.   Musculoskeletal: Negative.   Skin: Negative.   Neurological: Negative.   Psychiatric/Behavioral:  The patient is nervous/anxious and has insomnia.    Blood pressure 123/72, pulse 79, temperature 97.9 F (36.6 C), temperature source Oral, resp. rate 18, height '6\' 5"'$  (1.956 m), weight 102.1 kg, SpO2 99 %. Body mass index is 26.68 kg/m.   Treatment Plan Summary: Plan I am going to try to change his medicine schedules so that everything will be given at the same time twice a day so that we can avoid confusion from him.  He tends to refuse medicines if he has already taken some earlier that morning even if they are properly scheduled.  Explained this to the patient.  Pleaded with him again to be compliant with medicine or let us know what the problem is.  Continue 15-minute checks.  Alethia Berthold, MD 11/22/2022, 1:48 PM

## 2022-11-23 DIAGNOSIS — F250 Schizoaffective disorder, bipolar type: Secondary | ICD-10-CM | POA: Diagnosis not present

## 2022-11-23 NOTE — Group Note (Unsigned)
Date:  11/23/2022 Time:  11:29 PM  Group Topic/Focus:  Coping With Mental Health Crisis:   The purpose of this group is to help patients identify strategies for coping with mental health crisis.  Group discusses possible causes of crisis and ways to manage them effectively.     Participation Level:  {BHH PARTICIPATION HD:996081  Participation Quality:  {BHH PARTICIPATION QUALITY:22265}  Affect:  {BHH AFFECT:22266}  Cognitive:  {BHH COGNITIVE:22267}  Insight: {BHH Insight2:20797}  Engagement in Group:  {BHH ENGAGEMENT IN J[place]:3131603  Modes of Intervention:  {BHH MODES OF INTERVENTION:22269}  Additional Comments:  ***  Curtis Hodges 11/23/2022, 11:29 PM

## 2022-11-23 NOTE — Progress Notes (Signed)
Patient irritable this shift, denies SI, Hi, AVH. Prns given for sleep and agitation. Denies SI, Hi, AVH. Minimal interaction with staff and peers. Isolative to self. Noted on phone speaking with mother. Requests digoxin for Afib  Encouragement and support provided. Safety checks maintained. Medications given as prescribed. Pt receptive and remains safe on unit with q 15 min checks.

## 2022-11-23 NOTE — Group Note (Signed)
Recreation Therapy Group Note   Group Topic:Leisure Education  Group Date: 11/23/2022 Start Time: 0945 End Time: 1115 Facilitators: Leona Carry, CTRS Location:  Craft Room  Group Description: Leisure. Patients were given the option to play [place], [place] or [place]. Pts decided to play [place] as a group. After multiple rounds, pts chose to also play [place] as a group. Pt's identified 2 of their leisure interests outside of the hospital and engaged in discussion on the importance of leisure activities in their daily life post-discharge.   Affect/Mood: N/A   Participation Level: Did not attend    Clinical Observations/Individualized Feedback: Curtis Hodges did not attend group due to resting in his room.  Plan: Continue to engage patient in RT group sessions 2-3x/week.   Vilma Prader, LRT, CTRS 11/23/2022 11:42 AM

## 2022-11-23 NOTE — Group Note (Signed)
LCSW Group Therapy Note    Group Date: 11/23/2022 Start Time: 1400 End Time: 1500   Type of Therapy and Topic: Group Therapy: Body Image  Participation Level:  None  Description of Group:  Patients were educated about body image and asked to think about whether they have a healthy or unhealthy body image. Patients were led in a discussion about factors that contribute to body image, both internal and external. Patients were asked to discuss strengths of the human body outside of appearance, such as being able to fight off diseases and provide stress relief. Lastly, patients were asked to identify one way in which they appreciate their own body outside of appearance.   Therapeutic Goals:   1. Patient will differentiate between a healthy and unhealthy body image. 2. Patient will identify what contributes to body image 3. Patient will discuss the strengths of the human body. 4. Patient will identify a positive attribute of their body outside of physical appearance.  Summary of Patient Progress:   Patient was present in group.  Patient appeared attentive.  Patient did not engage in group discussion.   Therapeutic Modalities: Cognitive Behavioral Therapy; Solution-Focused Therapy  Rozann Lesches, Latanya Presser 11/23/2022  3:23 PM

## 2022-11-23 NOTE — BHH Group Notes (Signed)
[place] Group Notes:  (Nursing/MHT/Case Management/Adjunct)  Date:  11/23/2022  Time:  9:19 PM  Type of Therapy:  Group Therapy  Participation Level:  Minimal  Participation Quality:  Inattentive  Affect:  Flat  Cognitive:  Disorganized  Insight:  Lacking  Engagement in Group:  Lacking and None  Modes of Intervention:  Discussion  Summary of Progress/Problems:  Maglione,Danis [place]ble[place] E 11/23/2022, 9:19 PM

## 2022-11-23 NOTE — Progress Notes (Signed)
Patient is calm and cooperative this morning. He came to the dayroom to eat breakfast and requested to take his medications after breakfast. Patient was educated on medications and was compliant with all scheduled medications. He denies pain and other physical symptoms. Patient remains safe on the unit at this time.

## 2022-11-23 NOTE — Plan of Care (Signed)
?  Problem: Coping: ?Goal: Level of anxiety will decrease ?Outcome: Progressing ?  ?Problem: Safety: ?Goal: Ability to remain free from injury will improve ?Outcome: Progressing ?  ?

## 2022-11-23 NOTE — Progress Notes (Signed)
[facility] MD Progress Note  11/23/2022 11:15 AM Curtis Hodges  MRN:  [id number] Subjective: Patient seen and chart reviewed.  Patient presents today significantly calmer than yesterday.  He has been compliant with his medicine.  He slept well last night.  He is not showing racing speech or bizarre behavior so far today. Principal Problem: Schizoaffective disorder, bipolar type (HCC) Diagnosis: Principal Problem:   Schizoaffective disorder, bipolar type ([place]) Active Problems:   Hypothyroidism, unspecified   Unspecified atrial fibrillation ([place])   Personal history of pulmonary embolism   Bilateral edema of lower extremity   Chronic systolic heart failure (HCC)   Bipolar 1 disorder (HCC)  Total Time spent with patient: 30 minutes  Past Psychiatric History: Past history of schizoaffective disorder  Past Medical History:  Past Medical History:  Diagnosis Date   Atrial fibrillation ([place])    Hypertension    Thyroid disease     Past Surgical History:  Procedure Laterality Date   CHOLECYSTECTOMY     GALLBLADDER SURGERY     Family History:  Family History  Problem Relation Age of Onset   COPD Mother    Atrial fibrillation Mother    Brain cancer Father    Family Psychiatric  History: See previous Social History:  Social History   Substance and Sexual Activity  Alcohol Use Not Currently     Social History   Substance and Sexual Activity  Drug Use Never    Social History   Socioeconomic History   Marital status: Single    Spouse name: Not on file   Number of children: Not on file   Years of education: Not on file   Highest education level: Not on file  Occupational History   Not on file  Tobacco Use   Smoking status: Never   Smokeless tobacco: Never  Vaping Use   Vaping Use: Never used  Substance and Sexual Activity   Alcohol use: Not Currently   Drug use: Never   Sexual activity: Not Currently  Other Topics Concern   Not on file  Social History Narrative   Not on  file   Social Determinants of Health   Financial Resource Strain: Low Risk  (07/23/2022)   Overall Financial Resource Strain (CARDIA)    Difficulty of Paying Living Expenses: Not hard at all  Food Insecurity: No Food Insecurity (11/15/2022)   Hunger Vital Sign    Worried About Running Out of Food in the Last Year: Never true    Ran Out of Food in the Last Year: Never true  Transportation Needs: No Transportation Needs (11/15/2022)   PRAPARE - Hydrologist (Medical): No    Lack of Transportation (Non-Medical): No  Physical Activity: Insufficiently Active (07/23/2022)   Exercise Vital Sign    Days of Exercise per Week: 5 days    Minutes of Exercise per Session: 10 min  Stress: No Stress Concern Present (07/23/2022)   [place]    Feeling of Stress : Only a little  Social Connections: Socially Isolated (07/23/2022)   Social Connection and Isolation Panel [NHANES]    Frequency of Communication with Friends and Family: Once a week    Frequency of Social Gatherings with Friends and Family: Once a week    Attends Religious Services: Never    Marine scientist or Organizations: No    Attends Archivist Meetings: 1 to 4 times per year    Marital  Status: Never married   Additional Social History:                         Sleep: Fair  Appetite:  Fair  Current Medications: Current Facility-Administered Medications  Medication Dose Route Frequency Provider Last Rate Last Admin   acetaminophen (TYLENOL) tablet 650 mg  650 mg Oral Q6H PRN Caroline Sauger, NP   650 mg at 11/21/22 0813   alum & mag hydroxide-simeth (MAALOX/MYLANTA) 200-200-20 MG/5ML suspension 30 mL  30 mL Oral Q4H PRN Caroline Sauger, NP       haloperidol (HALDOL) tablet 5 mg  5 mg Oral Q6H PRN Caroline Sauger, NP       And   benztropine (COGENTIN) tablet 1 mg  1 mg Oral Q6H PRN Caroline Sauger, NP       busPIRone (BUSPAR) tablet 15 mg  15 mg Oral BID Kaelyn Innocent, Madie Reno, MD   15 mg at 11/23/22 0816   diazepam (VALIUM) injection 5 mg  5 mg Intramuscular Q4H PRN Kately Graffam, Madie Reno, MD       digoxin (LANOXIN) tablet 250 mcg  250 mcg Oral Daily Caroline Sauger, NP   250 mcg at 11/23/22 0817   diphenhydrAMINE (BENADRYL) capsule 50 mg  50 mg Oral Q6H PRN Nikole Swartzentruber T, MD   50 mg at 11/21/22 [id number]   Or   diphenhydrAMINE (BENADRYL) injection 50 mg  50 mg Intramuscular Q6H PRN Markitta Ausburn T, MD       haloperidol (HALDOL) tablet 5 mg  5 mg Oral Q6H PRN Madhavi Hamblen T, MD   5 mg at 11/19/22 1952   Or   haloperidol lactate (HALDOL) injection 5 mg  5 mg Intramuscular Q6H PRN Angeleen Horney, Madie Reno, MD       levothyroxine (SYNTHROID) tablet 75 mcg  75 mcg Oral Q0600 Paulyne Mooty T, MD   75 mcg at 11/23/22 [id number]   lithium carbonate capsule 300 mg  300 mg Oral BID WC Keturah Yerby T, MD   300 mg at 11/23/22 0817   risperiDONE (RISPERDAL M-TABS) disintegrating tablet 2 mg  2 mg Oral Q8H PRN Caroline Sauger, NP   2 mg at 11/17/22 1146   And   LORazepam (ATIVAN) tablet 1 mg  1 mg Oral PRN Caroline Sauger, NP       LORazepam (ATIVAN) tablet 2 mg  2 mg Oral Q4H PRN Javon Hupfer, Madie Reno, MD   2 mg at 11/22/22 2032   magnesium hydroxide (MILK OF MAGNESIA) suspension 30 mL  30 mL Oral Daily PRN Caroline Sauger, NP       metoprolol succinate (TOPROL-XL) 24 hr tablet 50 mg  50 mg Oral QHS Allysha Tryon T, MD       midodrine (PROAMATINE) tablet 5 mg  5 mg Oral BID WC Caroline Sauger, NP   5 mg at 11/23/22 [id number]   Oxcarbazepine (TRILEPTAL) tablet 600 mg  600 mg Oral BID Caroline Sauger, NP   600 mg at 11/23/22 0816   QUEtiapine (SEROQUEL) tablet 100 mg  100 mg Oral Daily Lainy Wrobleski, Madie Reno, MD   100 mg at 11/23/22 [id number]   QUEtiapine (SEROQUEL) tablet 400 mg  400 mg Oral QHS Gehrig Patras T, MD       ramelteon (ROZEREM) tablet 8 mg  8 mg Oral QHS Jeven Topper T, MD   8 mg at 11/22/22 2033    risperiDONE (RISPERDAL M-TABS) disintegrating tablet 1.5 mg  1.5 mg Oral QHS Jaser Fullen,  Madie Reno, MD   1.5 mg at 11/22/22 2035   risperiDONE (RISPERDAL M-TABS) disintegrating tablet 2 mg  2 mg Oral Q8H PRN Caroline Sauger, NP   2 mg at 11/22/22 2032   rivaroxaban (XARELTO) tablet 20 mg  20 mg Oral q morning Ramiyah Mcclenahan T, MD   20 mg at 11/23/22 0817   temazepam (RESTORIL) capsule 15 mg  15 mg Oral QHS PRN Myles Mallicoat, Madie Reno, MD   15 mg at 11/22/22 2032    Lab Results: No results found for this or any previous visit (from the past 48 hour(s)).  Blood Alcohol level:  Lab Results  Component Value Date   ETH <10 123XX123    Metabolic Disorder Labs: Lab Results  Component Value Date   HGBA1C 5.8 (H) 11/17/2022   MPG 120 11/17/2022   MPG 117 07/23/2022   No results found for: "PROLACTIN" Lab Results  Component Value Date   CHOL 129 11/17/2022   TRIG 81 11/17/2022   HDL 65 11/17/2022   CHOLHDL 2.0 11/17/2022   VLDL 16 11/17/2022   LDLCALC 48 11/17/2022   LDLCALC 87 07/23/2022    Physical Findings: AIMS:  , ,  ,  ,    CIWA:    COWS:     Musculoskeletal: Strength & Muscle Tone: within normal limits Gait & Station: normal Patient leans: N/A  Psychiatric Specialty Exam:  Presentation  General Appearance:  Bizarre  Eye Contact: Minimal  Speech: Pressured; Clear and Coherent  Speech Volume: Increased  Handedness: Right   Mood and Affect  Mood: Angry; Irritable  Affect: Inappropriate; Full Range   Thought Process  Thought Processes: Coherent  Descriptions of Associations:Circumstantial  Orientation:Full (Time, Place and Person)  Thought Content:Logical; Obsessions  History of Schizophrenia/Schizoaffective disorder:No  Duration of Psychotic Symptoms:Less than six months  Hallucinations:No data recorded Ideas of Reference:None  Suicidal Thoughts:No data recorded Homicidal Thoughts:No data recorded  Sensorium  Memory: Immediate Good;  Recent Good; Remote Good  Judgment: Poor  Insight: Fair   Community education officer  Concentration: Poor  Attention Span: Poor  Recall: Good  Fund of Knowledge: Good  Language: Good   Psychomotor Activity  Psychomotor Activity:No data recorded  Assets  Assets: Communication Skills; Desire for Improvement; Social Support; Resilience; Physical Health   Sleep  Sleep:No data recorded   Physical Exam: Physical Exam Vitals and nursing note reviewed.  Constitutional:      Appearance: Normal appearance.  HENT:     Head: Normocephalic and atraumatic.     Mouth/Throat:     Pharynx: Oropharynx is clear.  Eyes:     Pupils: Pupils are equal, round, and reactive to light.  Cardiovascular:     Rate and Rhythm: Normal rate and regular rhythm.  Pulmonary:     Effort: Pulmonary effort is normal.     Breath sounds: Normal breath sounds.  Abdominal:     General: Abdomen is flat.     Palpations: Abdomen is soft.  Musculoskeletal:        General: Normal range of motion.  Skin:    General: Skin is warm and dry.  Neurological:     General: No focal deficit present.     Mental Status: He is alert. Mental status is at baseline.  Psychiatric:        Attention and Perception: He is inattentive.        Mood and Affect: Mood normal. Affect is blunt.        Speech: Speech is delayed.  Behavior: Behavior is slowed.        Thought Content: Thought content normal.    Review of Systems  Constitutional: Negative.   HENT: Negative.    Eyes: Negative.   Respiratory: Negative.    Cardiovascular: Negative.   Gastrointestinal: Negative.   Musculoskeletal: Negative.   Skin: Negative.   Neurological: Negative.   Psychiatric/Behavioral: Negative.     Blood pressure 122/75, pulse 90, temperature 98 F (36.7 C), temperature source Oral, resp. rate 18, height '6\' 5"'$  (1.956 m), weight 102.1 kg, SpO2 100 %. Body mass index is 26.68 kg/m.   Treatment Plan Summary: Medication  management and Plan no change to medication.  Check lithium level after the weekend.  Encourage patient and update him on the plans to continue medicine on just a twice daily basis.  Alethia Berthold, MD 11/23/2022, 11:15 AM

## 2022-11-24 DIAGNOSIS — F250 Schizoaffective disorder, bipolar type: Secondary | ICD-10-CM | POA: Diagnosis not present

## 2022-11-24 NOTE — B[facility] Group Notes (Signed)
LCSW Group Therapy Note  11/24/2022 1:00pm  Type of Therapy/Topic:  Group Therapy:  Balance in Life  Participation Level:  Minimal  Description of Group:    This group will address the concept of balance and how it feels and looks when one is unbalanced. Patients will be encouraged to process areas in their lives that are out of balance and identify reasons for remaining unbalanced. Facilitators will guide patients in utilizing problem-solving interventions to address and correct the stressor making their life unbalanced. Understanding and applying boundaries will be explored and addressed for obtaining and maintaining a balanced life. Patients will be encouraged to explore ways to assertively make their unbalanced needs known to significant others in their lives, using other group members and facilitator for support and feedback.  Therapeutic Goals: Patient will identify two or more emotions or situations they have that consume much of in their lives. Patient will identify signs/triggers that life has become out of balance:  Patient will identify two ways to set boundaries in order to achieve balance in their lives:  Patient will demonstrate ability to communicate their needs through discussion and/or role plays  Summary of Patient Progress:Pt mostly quiet in group, did respond to CSW questions.  Pt identified anger as an emotion that can get out of balance.  Little participation.        Therapeutic Modalities:   Cognitive Behavioral Therapy Solution-Focused Therapy Assertiveness Training  Deirdre Evener 11/24/2022 2:43 PM

## 2022-11-24 NOTE — Progress Notes (Signed)
[facility] MD Progress Note  11/24/2022 10:54 AM Curtis Hodges  MRN:  [id number] Subjective: Curtis Hodges is seen on rounds.  He is afraid he is getting too many medicines at bedtime and that he is too tired in the morning after his medications.  Told him I would stop his morning Seroquel.  He is doing fine with the lithium twice a day.  We talked about his blood pressure medicine that he is already on extended release at bedtime which is what I was going to change but is already been done.  No other issues.  No other complaints.  He is pleasant and cooperative. Principal Problem: Schizoaffective disorder, bipolar type (HCC) Diagnosis: Principal Problem:   Schizoaffective disorder, bipolar type ([place]) Active Problems:   Hypothyroidism, unspecified   Unspecified atrial fibrillation ([place])   Personal history of pulmonary embolism   Bilateral edema of lower extremity   Chronic systolic heart failure (HCC)   Bipolar 1 disorder (HCC)  Total Time spent with patient: 15 minutes  Past Psychiatric History: Schizoaffective disorder bipolar type  Past Medical History:  Past Medical History:  Diagnosis Date   Atrial fibrillation ([place])    Hypertension    Thyroid disease     Past Surgical History:  Procedure Laterality Date   CHOLECYSTECTOMY     GALLBLADDER SURGERY     Family History:  Family History  Problem Relation Age of Onset   COPD Mother    Atrial fibrillation Mother    Brain cancer Father    Family Psychiatric  History: Unremarkable Social History:  Social History   Substance and Sexual Activity  Alcohol Use Not Currently     Social History   Substance and Sexual Activity  Drug Use Never    Social History   Socioeconomic History   Marital status: Single    Spouse name: Not on file   Number of children: Not on file   Years of education: Not on file   Highest education level: Not on file  Occupational History   Not on file  Tobacco Use   Smoking status: Never   Smokeless tobacco:  Never  Vaping Use   Vaping Use: Never used  Substance and Sexual Activity   Alcohol use: Not Currently   Drug use: Never   Sexual activity: Not Currently  Other Topics Concern   Not on file  Social History Narrative   Not on file   Social Determinants of Health   Financial Resource Strain: Low Risk  (07/23/2022)   Overall Financial Resource Strain (CARDIA)    Difficulty of Paying Living Expenses: Not hard at all  Food Insecurity: No Food Insecurity (11/15/2022)   Hunger Vital Sign    Worried About Running Out of Food in the Last Year: Never true    Ran Out of Food in the Last Year: Never true  Transportation Needs: No Transportation Needs (11/15/2022)   PRAPARE - Hydrologist (Medical): No    Lack of Transportation (Non-Medical): No  Physical Activity: Insufficiently Active (07/23/2022)   Exercise Vital Sign    Days of Exercise per Week: 5 days    Minutes of Exercise per Session: 10 min  Stress: No Stress Concern Present (07/23/2022)   [place]    Feeling of Stress : Only a little  Social Connections: Socially Isolated (07/23/2022)   Social Connection and Isolation Panel [NHANES]    Frequency of Communication with Friends and Family: Once  a week    Frequency of Social Gatherings with Friends and Family: Once a week    Attends Religious Services: Never    Marine scientist or Organizations: No    Attends Music therapist: 1 to 4 times per year    Marital Status: Never married   Additional Social History:                         Sleep: Good  Appetite:  Good  Current Medications: Current Facility-Administered Medications  Medication Dose Route Frequency Provider Last Rate Last Admin   acetaminophen (TYLENOL) tablet 650 mg  650 mg Oral Q6H PRN Caroline Sauger, NP   650 mg at 11/21/22 0813   alum & mag hydroxide-simeth (MAALOX/MYLANTA) 200-200-20  MG/5ML suspension 30 mL  30 mL Oral Q4H PRN Caroline Sauger, NP       haloperidol (HALDOL) tablet 5 mg  5 mg Oral Q6H PRN Caroline Sauger, NP       And   benztropine (COGENTIN) tablet 1 mg  1 mg Oral Q6H PRN Caroline Sauger, NP       busPIRone (BUSPAR) tablet 15 mg  15 mg Oral BID Clapacs, John T, MD   15 mg at 11/24/22 0817   diazepam (VALIUM) injection 5 mg  5 mg Intramuscular Q4H PRN Clapacs, Madie Reno, MD       digoxin (LANOXIN) tablet 250 mcg  250 mcg Oral Daily Caroline Sauger, NP   250 mcg at 11/23/22 0817   diphenhydrAMINE (BENADRYL) capsule 50 mg  50 mg Oral Q6H PRN Clapacs, John T, MD   50 mg at 11/21/22 [id number]   Or   diphenhydrAMINE (BENADRYL) injection 50 mg  50 mg Intramuscular Q6H PRN Clapacs, John T, MD       haloperidol (HALDOL) tablet 5 mg  5 mg Oral Q6H PRN Clapacs, John T, MD   5 mg at 11/19/22 1952   Or   haloperidol lactate (HALDOL) injection 5 mg  5 mg Intramuscular Q6H PRN Clapacs, John T, MD       levothyroxine (SYNTHROID) tablet 75 mcg  75 mcg Oral Q0600 Clapacs, John T, MD   75 mcg at 11/24/22 [id number]   lithium carbonate capsule 300 mg  300 mg Oral BID WC Clapacs, John T, MD   300 mg at 11/23/22 1653   risperiDONE (RISPERDAL M-TABS) disintegrating tablet 2 mg  2 mg Oral Q8H PRN Caroline Sauger, NP   2 mg at 11/17/22 1146   And   LORazepam (ATIVAN) tablet 1 mg  1 mg Oral PRN Caroline Sauger, NP       LORazepam (ATIVAN) tablet 2 mg  2 mg Oral Q4H PRN Clapacs, John T, MD   2 mg at 11/24/22 [id number]   magnesium hydroxide (MILK OF MAGNESIA) suspension 30 mL  30 mL Oral Daily PRN Caroline Sauger, NP       metoprolol succinate (TOPROL-XL) 24 hr tablet 50 mg  50 mg Oral QHS Clapacs, John T, MD   50 mg at 11/24/22 0819   midodrine (PROAMATINE) tablet 5 mg  5 mg Oral BID WC Caroline Sauger, NP   5 mg at 11/24/22 [id number]   Oxcarbazepine (TRILEPTAL) tablet 600 mg  600 mg Oral BID Caroline Sauger, NP   600 mg at 11/24/22 0817   QUEtiapine (SEROQUEL)  tablet 400 mg  400 mg Oral QHS Clapacs, Madie Reno, MD   400 mg at 11/23/22 1653   ramelteon (ROZEREM)  tablet 8 mg  8 mg Oral QHS Clapacs, John T, MD   8 mg at 11/22/22 2033   risperiDONE (RISPERDAL M-TABS) disintegrating tablet 1.5 mg  1.5 mg Oral QHS Clapacs, John T, MD   1.5 mg at 11/23/22 1653   risperiDONE (RISPERDAL M-TABS) disintegrating tablet 2 mg  2 mg Oral Q8H PRN Caroline Sauger, NP   2 mg at 11/22/22 2032   rivaroxaban (XARELTO) tablet 20 mg  20 mg Oral q morning Clapacs, John T, MD   20 mg at 11/24/22 0817   temazepam (RESTORIL) capsule 15 mg  15 mg Oral QHS PRN Clapacs, Madie Reno, MD   15 mg at 11/22/22 2032    Lab Results: No results found for this or any previous visit (from the past 48 hour(s)).  Blood Alcohol level:  Lab Results  Component Value Date   ETH <10 123XX123    Metabolic Disorder Labs: Lab Results  Component Value Date   HGBA1C 5.8 (H) 11/17/2022   MPG 120 11/17/2022   MPG 117 07/23/2022   No results found for: "PROLACTIN" Lab Results  Component Value Date   CHOL 129 11/17/2022   TRIG 81 11/17/2022   HDL 65 11/17/2022   CHOLHDL 2.0 11/17/2022   VLDL 16 11/17/2022   LDLCALC 48 11/17/2022   LDLCALC 87 07/23/2022    Physical Findings: AIMS:  , ,  ,  ,    CIWA:    COWS:     Musculoskeletal: Strength & Muscle Tone: within normal limits Gait & Station: normal Patient leans: N/A  Psychiatric Specialty Exam:  Presentation  General Appearance:  Bizarre  Eye Contact: Minimal  Speech: Pressured; Clear and Coherent  Speech Volume: Increased  Handedness: Right   Mood and Affect  Mood: Angry; Irritable  Affect: Inappropriate; Full Range   Thought Process  Thought Processes: Coherent  Descriptions of Associations:Circumstantial  Orientation:Full (Time, Place and Person)  Thought Content:Logical; Obsessions  History of Schizophrenia/Schizoaffective disorder:No  Duration of Psychotic Symptoms:Less than six  months  Hallucinations:No data recorded Ideas of Reference:None  Suicidal Thoughts:No data recorded Homicidal Thoughts:No data recorded  Sensorium  Memory: Immediate Good; Recent Good; Remote Good  Judgment: Poor  Insight: Fair   Community education officer  Concentration: Poor  Attention Span: Poor  Recall: Good  Fund of Knowledge: Good  Language: Good   Psychomotor Activity  Psychomotor Activity:No data recorded  Assets  Assets: Communication Skills; Desire for Improvement; Social Support; Resilience; Physical Health   Sleep  Sleep:No data recorded    Blood pressure 108/68, pulse 100, temperature 98.3 F (36.8 C), temperature source Oral, resp. rate 18, height '6\' 5"'$  (1.956 m), weight 102.1 kg, SpO2 100 %. Body mass index is 26.68 kg/m.   Treatment Plan Summary: Daily contact with patient to assess and evaluate symptoms and progress in treatment, Medication management, and Plan continue current medications.  Parks Ranger, DO 11/24/2022, 10:54 AM

## 2022-11-24 NOTE — Plan of Care (Signed)
D- Patient alert and oriented. Patient presented in a preoccupied, but pleasant mood on assessment reporting that he slept good last night and had no complaints to voice to this Probation officer. Patient endorsed both depression and anxiety on his self-inventory, rating them both a "5/10", stating that "grief" is what has him feeling this way. When this writer asked him about why he's feeling anxious, he stated "I've told you enough already, I'm not going any further". Patient denies SI, HI, AVH, and pain at this time. Per his self-inventory, patient's stated goal for today is to "study therapies, remain calm, stay awake, work on laundry", in which he will "work on Medical sales representative, take meds as directed, clean, watch a little sports to stay calm", in order to achieve his goal.  A- Some scheduled medications administered to patient, per MD orders. Support and encouragement provided.  Routine safety checks conducted every 15 minutes.  Patient informed to notify staff with problems or concerns.  R- No adverse drug reactions noted. Patient contracts for safety at this time. Patient compliant with medications and treatment plan. Patient receptive, calm, and cooperative. Patient remains safe at this time.  Problem: Education: Goal: Knowledge of General Education information will improve Description: Including pain rating scale, medication(s)/side effects and non-pharmacologic comfort measures Outcome: Not Progressing   Problem: Health Behavior/Discharge Planning: Goal: Ability to manage health-related needs will improve Outcome: Not Progressing   Problem: Clinical Measurements: Goal: Ability to maintain clinical measurements within normal limits will improve Outcome: Not Progressing Goal: Will remain free from infection Outcome: Not Progressing Goal: Diagnostic test results will improve Outcome: Not Progressing Goal: Respiratory complications will improve Outcome: Not Progressing Goal: Cardiovascular complication  will be avoided Outcome: Not Progressing   Problem: Activity: Goal: Risk for activity intolerance will decrease Outcome: Not Progressing   Problem: Nutrition: Goal: Adequate nutrition will be maintained Outcome: Not Progressing   Problem: Coping: Goal: Level of anxiety will decrease Outcome: Not Progressing   Problem: Elimination: Goal: Will not experience complications related to bowel motility Outcome: Not Progressing Goal: Will not experience complications related to urinary retention Outcome: Not Progressing   Problem: Pain Managment: Goal: General experience of comfort will improve Outcome: Not Progressing   Problem: Safety: Goal: Ability to remain free from injury will improve Outcome: Not Progressing   Problem: Skin Integrity: Goal: Risk for impaired skin integrity will decrease Outcome: Not Progressing

## 2022-11-24 NOTE — Progress Notes (Signed)
Patient refused some scheduled medications stating that he takes them at bedtime; in which he showed this writer his composition book of his medications, and the times that he wants to take them. He also asked to take his Metoprolol this morning, instead of in the evening. MD will be notified during progression rounds.

## 2022-11-24 NOTE — Progress Notes (Signed)
No distress noted, denies SI/HI/AVH, complaint with medication 15 minutes safety checks maintained .

## 2022-11-25 DIAGNOSIS — F250 Schizoaffective disorder, bipolar type: Secondary | ICD-10-CM | POA: Diagnosis not present

## 2022-11-25 MED ORDER — BACITRACIN-NEOMYCIN-POLYMYXIN OINTMENT TUBE
TOPICAL_OINTMENT | Freq: Every day | CUTANEOUS | Status: DC
  Filled 2022-11-25: qty 14.17

## 2022-11-25 MED ORDER — METOPROLOL SUCCINATE ER 50 MG PO TB24
50.0000 mg | ORAL_TABLET | Freq: Every day | ORAL | Status: DC
  Administered 2022-11-25 – 2022-12-03 (×9): 50 mg via ORAL
  Filled 2022-11-25 (×6): qty 2
  Filled 2022-11-25: qty 1
  Filled 2022-11-25 (×2): qty 2

## 2022-11-25 NOTE — Progress Notes (Signed)
Patient refuse to take evening medications.States " I will take all my medicines before I go to bed." No other issues verbalized.

## 2022-11-25 NOTE — Progress Notes (Signed)
[facility] MD Progress Note  11/25/2022 11:07 AM Curtis Hodges  MRN:  [id number] Subjective: Curtis Hodges is seen on rounds.  Today, he tells me he wants to take his metoprolol in the morning. Principal Problem: Schizoaffective disorder, bipolar type (HCC) Diagnosis: Principal Problem:   Schizoaffective disorder, bipolar type ([place]) Active Problems:   Hypothyroidism, unspecified   Unspecified atrial fibrillation ([place])   Personal history of pulmonary embolism   Bilateral edema of lower extremity   Chronic systolic heart failure (HCC)   Bipolar 1 disorder (HCC)  Total Time spent with patient: 15 minutes  Past Psychiatric History: Schizoaffective disorder  Past Medical History:  Past Medical History:  Diagnosis Date   Atrial fibrillation ([place])    Hypertension    Thyroid disease     Past Surgical History:  Procedure Laterality Date   CHOLECYSTECTOMY     GALLBLADDER SURGERY     Family History:  Family History  Problem Relation Age of Onset   COPD Mother    Atrial fibrillation Mother    Brain cancer Father    Family Psychiatric  History: Unremarkable Social History:  Social History   Substance and Sexual Activity  Alcohol Use Not Currently     Social History   Substance and Sexual Activity  Drug Use Never    Social History   Socioeconomic History   Marital status: Single    Spouse name: Not on file   Number of children: Not on file   Years of education: Not on file   Highest education level: Not on file  Occupational History   Not on file  Tobacco Use   Smoking status: Never   Smokeless tobacco: Never  Vaping Use   Vaping Use: Never used  Substance and Sexual Activity   Alcohol use: Not Currently   Drug use: Never   Sexual activity: Not Currently  Other Topics Concern   Not on file  Social History Narrative   Not on file   Social Determinants of Health   Financial Resource Strain: Low Risk  (07/23/2022)   Overall Financial Resource Strain (CARDIA)    Difficulty  of Paying Living Expenses: Not hard at all  Food Insecurity: No Food Insecurity (11/15/2022)   Hunger Vital Sign    Worried About Running Out of Food in the Last Year: Never true    Ran Out of Food in the Last Year: Never true  Transportation Needs: No Transportation Needs (11/15/2022)   PRAPARE - Hydrologist (Medical): No    Lack of Transportation (Non-Medical): No  Physical Activity: Insufficiently Active (07/23/2022)   Exercise Vital Sign    Days of Exercise per Week: 5 days    Minutes of Exercise per Session: 10 min  Stress: No Stress Concern Present (07/23/2022)   [place]    Feeling of Stress : Only a little  Social Connections: Socially Isolated (07/23/2022)   Social Connection and Isolation Panel [NHANES]    Frequency of Communication with Friends and Family: Once a week    Frequency of Social Gatherings with Friends and Family: Once a week    Attends Religious Services: Never    Marine scientist or Organizations: No    Attends Music therapist: 1 to 4 times per year    Marital Status: Never married   Additional Social History:  Sleep: Good  Appetite:  Good  Current Medications: Current Facility-Administered Medications  Medication Dose Route Frequency Provider Last Rate Last Admin   acetaminophen (TYLENOL) tablet 650 mg  650 mg Oral Q6H PRN Caroline Sauger, NP   650 mg at 11/21/22 0813   alum & mag hydroxide-simeth (MAALOX/MYLANTA) 200-200-20 MG/5ML suspension 30 mL  30 mL Oral Q4H PRN Caroline Sauger, NP       haloperidol (HALDOL) tablet 5 mg  5 mg Oral Q6H PRN Caroline Sauger, NP       And   benztropine (COGENTIN) tablet 1 mg  1 mg Oral Q6H PRN Caroline Sauger, NP       busPIRone (BUSPAR) tablet 15 mg  15 mg Oral BID Clapacs, John T, MD   15 mg at 11/25/22 0818   diazepam (VALIUM) injection 5 mg  5 mg  Intramuscular Q4H PRN Clapacs, Madie Reno, MD       digoxin (LANOXIN) tablet 250 mcg  250 mcg Oral Daily Caroline Sauger, NP   250 mcg at 11/23/22 [id number]   diphenhydrAMINE (BENADRYL) capsule 50 mg  50 mg Oral Q6H PRN Clapacs, John T, MD   50 mg at 11/21/22 [id number]   Or   diphenhydrAMINE (BENADRYL) injection 50 mg  50 mg Intramuscular Q6H PRN Clapacs, John T, MD       haloperidol (HALDOL) tablet 5 mg  5 mg Oral Q6H PRN Clapacs, Madie Reno, MD   5 mg at 11/19/22 1952   Or   haloperidol lactate (HALDOL) injection 5 mg  5 mg Intramuscular Q6H PRN Clapacs, Madie Reno, MD       levothyroxine (SYNTHROID) tablet 75 mcg  75 mcg Oral Q0600 Clapacs, John T, MD   75 mcg at 11/24/22 [id number]   lithium carbonate capsule 300 mg  300 mg Oral BID WC Clapacs, John T, MD   300 mg at 11/25/22 0818   risperiDONE (RISPERDAL M-TABS) disintegrating tablet 2 mg  2 mg Oral Q8H PRN Caroline Sauger, NP   2 mg at 11/17/22 1146   And   LORazepam (ATIVAN) tablet 1 mg  1 mg Oral PRN Caroline Sauger, NP       LORazepam (ATIVAN) tablet 2 mg  2 mg Oral Q4H PRN Clapacs, John T, MD   2 mg at 11/24/22 [id number]   magnesium hydroxide (MILK OF MAGNESIA) suspension 30 mL  30 mL Oral Daily PRN Caroline Sauger, NP       metoprolol succinate (TOPROL-XL) 24 hr tablet 50 mg  50 mg Oral Daily Parks Ranger, DO   50 mg at 11/25/22 1103   midodrine (PROAMATINE) tablet 5 mg  5 mg Oral BID WC Caroline Sauger, NP   5 mg at 11/25/22 [id number]   neomycin-bacitracin-polymyxin (NEOSPORIN) ointment   Topical Daily Parks Ranger, DO       Oxcarbazepine (TRILEPTAL) tablet 600 mg  600 mg Oral BID Caroline Sauger, NP   600 mg at 11/25/22 [id number]   QUEtiapine (SEROQUEL) tablet 400 mg  400 mg Oral QHS Clapacs, John T, MD   400 mg at 11/24/22 1707   ramelteon (ROZEREM) tablet 8 mg  8 mg Oral QHS Clapacs, John T, MD   8 mg at 11/22/22 2033   risperiDONE (RISPERDAL M-TABS) disintegrating tablet 1.5 mg  1.5 mg Oral QHS Clapacs, John T, MD   1.5  mg at 11/24/22 1707   risperiDONE (RISPERDAL M-TABS) disintegrating tablet 2 mg  2 mg Oral Q8H PRN Caroline Sauger, NP   2 mg  at 11/22/22 2032   rivaroxaban (XARELTO) tablet 20 mg  20 mg Oral q morning Clapacs, John T, MD   20 mg at 11/25/22 0819   temazepam (RESTORIL) capsule 15 mg  15 mg Oral QHS PRN Clapacs, Madie Reno, MD   15 mg at 11/22/22 2032    Lab Results: No results found for this or any previous visit (from the past 48 hour(s)).  Blood Alcohol level:  Lab Results  Component Value Date   ETH <10 123XX123    Metabolic Disorder Labs: Lab Results  Component Value Date   HGBA1C 5.8 (H) 11/17/2022   MPG 120 11/17/2022   MPG 117 07/23/2022   No results found for: "PROLACTIN" Lab Results  Component Value Date   CHOL 129 11/17/2022   TRIG 81 11/17/2022   HDL 65 11/17/2022   CHOLHDL 2.0 11/17/2022   VLDL 16 11/17/2022   LDLCALC 48 11/17/2022   LDLCALC 87 07/23/2022    Physical Findings: AIMS:  , ,  ,  ,    CIWA:    COWS:     Musculoskeletal: Strength & Muscle Tone: within normal limits Gait & Station: normal Patient leans: N/A  Psychiatric Specialty Exam:  Presentation  General Appearance:  Bizarre  Eye Contact: Minimal  Speech: Pressured; Clear and Coherent  Speech Volume: Increased  Handedness: Right   Mood and Affect  Mood: Angry; Irritable  Affect: Inappropriate; Full Range   Thought Process  Thought Processes: Coherent  Descriptions of Associations:Circumstantial  Orientation:Full (Time, Place and Person)  Thought Content:Logical; Obsessions  History of Schizophrenia/Schizoaffective disorder:No  Duration of Psychotic Symptoms:Less than six months  Hallucinations:No data recorded Ideas of Reference:None  Suicidal Thoughts:No data recorded Homicidal Thoughts:No data recorded  Sensorium  Memory: Immediate Good; Recent Good; Remote Good  Judgment: Poor  Insight: Fair   Community education officer   Concentration: Poor  Attention Span: Poor  Recall: Good  Fund of Knowledge: Good  Language: Good   Psychomotor Activity  Psychomotor Activity:No data recorded  Assets  Assets: Communication Skills; Desire for Improvement; Social Support; Resilience; Physical Health   Sleep  Sleep:No data recorded   Blood pressure 133/74, pulse (!) 101, temperature 98.3 F (36.8 C), temperature source Oral, resp. rate 18, height '6\' 5"'$  (1.956 m), weight 102.1 kg, SpO2 100 %. Body mass index is 26.68 kg/m.   Treatment Plan Summary: Daily contact with patient to assess and evaluate symptoms and progress in treatment, Medication management, and Plan continue current medications.  Parks Ranger, DO 11/25/2022, 11:07 AM

## 2022-11-25 NOTE — BHH Group Notes (Signed)
[facility] Group Notes:  (Nursing/MHT/Case Management/Adjunct)  Date:  11/25/2022  Time:  7:14 PM  Type of Therapy:  Psychoeducational Skills  Participation Level:  Active  Participation Quality:  Appropriate  Affect:  Appropriate  Cognitive:  Appropriate  Insight:  Appropriate  Engagement in Group:  Engaged  Modes of Intervention:  Activity  Summary of Progress/Problems:  Adela Lank [facility] 11/25/2022, 7:14 PM

## 2022-11-25 NOTE — Plan of Care (Signed)
Patient refused Digoxin this morning stated that he takes Digoxin at bed time.Patient denies SI,HI and AVH. In & out of his room. Minimal interactions with staff & peers. No aggressive behaviors noted. Appetite and energy level good. Support and encouragement given.

## 2022-11-25 NOTE — BHH Group Notes (Signed)
LCSW Wellness Group Note   11/25/2022 12:45pm  Type of Group and Topic: Psychoeducational Group:  Wellness  Participation Level:  Active  Description of Group  Wellness group introduces the topic and its focus on developing healthy habits across the spectrum and its relationship to a decrease in hospital admissions.  Six areas of wellness are discussed: physical, social spiritual, intellectual, occupational, and emotional.  Patients are asked to consider their current wellness habits and to identify areas of wellness where they are interested and able to focus on improvements.    Therapeutic Goals Patients will understand components of wellness and how they can positively impact overall health.  Patients will identify areas of wellness where they have developed good habits. Patients will identify areas of wellness where they would like to make improvements.    Summary of Patient Progress:Curtis Hodges once again was attentive to the discussion, not a lot of participation but made some good comments when called on by CSW.  He identified financial and spiritual as wellness areas of strength and social as a wellness area that needs some work.      Therapeutic Modalities: Cognitive Behavioral Therapy Psychoeducation    Joanne Chars, LCSW

## 2022-11-26 ENCOUNTER — Encounter: Payer: BC Managed Care – PPO | Admitting: Physical Therapy

## 2022-11-26 DIAGNOSIS — F250 Schizoaffective disorder, bipolar type: Secondary | ICD-10-CM | POA: Diagnosis not present

## 2022-11-26 MED ORDER — LEVOTHYROXINE SODIUM 50 MCG PO TABS
75.0000 ug | ORAL_TABLET | Freq: Every day | ORAL | Status: DC
  Administered 2022-11-27: 75 ug via ORAL
  Filled 2022-11-26: qty 2

## 2022-11-26 NOTE — Progress Notes (Signed)
This writer assumed care of pt at 1pm. Patient is seen watching TV in the dayroom. No behavioral issues observed. Patient remains safe on the unit at this time.

## 2022-11-26 NOTE — Progress Notes (Signed)
D: Patient  alert and oriented x 3 with periods of confusion to situation, he denies SI/HI/AVH, he appears irritable and anxious with staff and not interacting with peers and staff appropriately.  A: Patient was offered support and encouragement., and given scheduled medications, he was encouraged to attend groups. 15 minute checks were done for safety.  R: Patient did not attends groups, he was complaint with medication regimen this evening.   Patient has no complaints, and he is receptive to treatment and safety maintained on unit.

## 2022-11-26 NOTE — BHH Group Notes (Signed)
[facility] Group Notes:  (Nursing/MHT/Case Management/Adjunct)  Date:  11/26/2022  Time:  3:24 PM  Type of Therapy:  Psychoeducational Skills  Participation Level:  Active  Participation Quality:  Appropriate  Affect:  Appropriate  Cognitive:  Appropriate  Insight:  Appropriate  Engagement in Group:  Engaged  Modes of Intervention:  Activity  Summary of Progress/Problems:  Adela Lank [facility] 11/26/2022, 3:24 PM

## 2022-11-26 NOTE — Progress Notes (Signed)
   11/26/22 2300  Psych Admission Type (Psych Patients Only)  Admission Status Voluntary  Psychosocial Assessment  Patient Complaints Anxiety  Eye Contact Fair  Facial Expression Flat  Affect Appropriate to circumstance  Speech Logical/coherent  Interaction Assertive  Motor Activity Restless  Appearance/Hygiene Unremarkable;Improved  Behavior Characteristics Unwilling to participate  Mood Preoccupied  Thought Process  Coherency Circumstantial  Content Preoccupation  Delusions None reported or observed  Perception WDL  Hallucination None reported or observed  Judgment Impaired  Confusion Mild  Danger to Self  Current suicidal ideation? Denies  Agreement Not to Harm Self Yes  Description of Agreement verbal  Danger to Others  Danger to Others None reported or observed

## 2022-11-26 NOTE — Progress Notes (Signed)
[facility] MD Progress Note  11/26/2022 2:39 PM Curtis Hodges  MRN:  [id number] Subjective: Follow-up 52 year old man with schizoaffective disorder.  Patient seen and chart reviewed.  Patient continues to be a little bit disheveled but not as badly as he was last week.  Also he is less agitated and significantly more lucid and able to keep on topic during conversations.  He still has been refusing medicines at times but seems to have become more compliant in general. Principal Problem: Schizoaffective disorder, bipolar type ([place]) Diagnosis: Principal Problem:   Schizoaffective disorder, bipolar type ([place]) Active Problems:   Hypothyroidism, unspecified   Unspecified atrial fibrillation ([place])   Personal history of pulmonary embolism   Bilateral edema of lower extremity   Chronic systolic heart failure ([place])   Bipolar 1 disorder ([place])  Total Time spent with patient: 30 minutes  Past Psychiatric History: Past history of what I think is schizoaffective disorder  Past Medical History:  Past Medical History:  Diagnosis Date   Atrial fibrillation ([place])    Hypertension    Thyroid disease     Past Surgical History:  Procedure Laterality Date   CHOLECYSTECTOMY     GALLBLADDER SURGERY     Family History:  Family History  Problem Relation Age of Onset   COPD Mother    Atrial fibrillation Mother    Brain cancer Father    Family Psychiatric  History: See previous Social History:  Social History   Substance and Sexual Activity  Alcohol Use Not Currently     Social History   Substance and Sexual Activity  Drug Use Never    Social History   Socioeconomic History   Marital status: Single    Spouse name: Not on file   Number of children: Not on file   Years of education: Not on file   Highest education level: Not on file  Occupational History   Not on file  Tobacco Use   Smoking status: Never   Smokeless tobacco: Never  Vaping Use   Vaping Use: Never used  Substance and Sexual  Activity   Alcohol use: Not Currently   Drug use: Never   Sexual activity: Not Currently  Other Topics Concern   Not on file  Social History Narrative   Not on file   Social Determinants of Health   Financial Resource Strain: Low Risk  (07/23/2022)   Overall Financial Resource Strain (CARDIA)    Difficulty of Paying Living Expenses: Not hard at all  Food Insecurity: No Food Insecurity (11/15/2022)   Hunger Vital Sign    Worried About Running Out of Food in the Last Year: Never true    Ran Out of Food in the Last Year: Never true  Transportation Needs: No Transportation Needs (11/15/2022)   PRAPARE - Hydrologist (Medical): No    Lack of Transportation (Non-Medical): No  Physical Activity: Insufficiently Active (07/23/2022)   Exercise Vital Sign    Days of Exercise per Week: 5 days    Minutes of Exercise per Session: 10 min  Stress: No Stress Concern Present (07/23/2022)   [place]    Feeling of Stress : Only a little  Social Connections: Socially Isolated (07/23/2022)   Social Connection and Isolation Panel [NHANES]    Frequency of Communication with Friends and Family: Once a week    Frequency of Social Gatherings with Friends and Family: Once a week    Attends Religious  Services: Never    Marine scientist or Organizations: No    Attends Music therapist: 1 to 4 times per year    Marital Status: Never married   Additional Social History:                         Sleep: Fair  Appetite:  Fair  Current Medications: Current Facility-Administered Medications  Medication Dose Route Frequency Provider Last Rate Last Admin   acetaminophen (TYLENOL) tablet 650 mg  650 mg Oral Q6H PRN Caroline Sauger, NP   650 mg at 11/21/22 0813   alum & mag hydroxide-simeth (MAALOX/MYLANTA) 200-200-20 MG/5ML suspension 30 mL  30 mL Oral Q4H PRN Caroline Sauger, NP        haloperidol (HALDOL) tablet 5 mg  5 mg Oral Q6H PRN Caroline Sauger, NP       And   benztropine (COGENTIN) tablet 1 mg  1 mg Oral Q6H PRN Caroline Sauger, NP       busPIRone (BUSPAR) tablet 15 mg  15 mg Oral BID Latisa Belay, Madie Reno, MD   15 mg at 11/26/22 [id number]   diazepam (VALIUM) injection 5 mg  5 mg Intramuscular Q4H PRN Elliona Doddridge, Madie Reno, MD       digoxin (LANOXIN) tablet 250 mcg  250 mcg Oral Daily Caroline Sauger, NP   250 mcg at 11/26/22 [id number]   diphenhydrAMINE (BENADRYL) capsule 50 mg  50 mg Oral Q6H PRN Hazelene Doten T, MD   50 mg at 11/21/22 [id number]   Or   diphenhydrAMINE (BENADRYL) injection 50 mg  50 mg Intramuscular Q6H PRN Zamzam Whinery T, MD       haloperidol (HALDOL) tablet 5 mg  5 mg Oral Q6H PRN Gracielynn Birkel T, MD   5 mg at 11/19/22 1952   Or   haloperidol lactate (HALDOL) injection 5 mg  5 mg Intramuscular Q6H PRN Eddison Searls, Madie Reno, MD       [START ON 11/27/2022] levothyroxine (SYNTHROID) tablet 75 mcg  75 mcg Oral Q0600 Kaitlin Ardito T, MD       lithium carbonate capsule 300 mg  300 mg Oral BID WC Atlas Kuc T, MD   300 mg at 11/26/22 0915   risperiDONE (RISPERDAL M-TABS) disintegrating tablet 2 mg  2 mg Oral Q8H PRN Caroline Sauger, NP   2 mg at 11/17/22 1146   And   LORazepam (ATIVAN) tablet 1 mg  1 mg Oral PRN Caroline Sauger, NP       LORazepam (ATIVAN) tablet 2 mg  2 mg Oral Q4H PRN Barnabas Henriques T, MD   2 mg at 11/24/22 [id number]   magnesium hydroxide (MILK OF MAGNESIA) suspension 30 mL  30 mL Oral Daily PRN Caroline Sauger, NP       metoprolol succinate (TOPROL-XL) 24 hr tablet 50 mg  50 mg Oral Daily Parks Ranger, DO   50 mg at 11/26/22 0832   midodrine (PROAMATINE) tablet 5 mg  5 mg Oral BID WC Caroline Sauger, NP   5 mg at 11/26/22 [id number]   neomycin-bacitracin-polymyxin (NEOSPORIN) ointment   Topical Daily Parks Ranger, DO       Oxcarbazepine (TRILEPTAL) tablet 600 mg  600 mg Oral BID Caroline Sauger, NP   600 mg at  11/26/22 [id number]   QUEtiapine (SEROQUEL) tablet 400 mg  400 mg Oral QHS Cavion Faiola T, MD   400 mg at 11/25/22 2100   ramelteon (ROZEREM) tablet 8 mg  8 mg Oral QHS Piedad Standiford T, MD   8 mg at 11/25/22 2029   risperiDONE (RISPERDAL M-TABS) disintegrating tablet 1.5 mg  1.5 mg Oral QHS Kadijah Shamoon T, MD   1.5 mg at 11/25/22 2200   risperiDONE (RISPERDAL M-TABS) disintegrating tablet 2 mg  2 mg Oral Q8H PRN Caroline Sauger, NP   2 mg at 11/22/22 2032   rivaroxaban (XARELTO) tablet 20 mg  20 mg Oral q morning Ashleen Demma, Madie Reno, MD   20 mg at 11/26/22 0833   temazepam (RESTORIL) capsule 15 mg  15 mg Oral QHS PRN Jaryiah Mehlman, Madie Reno, MD   15 mg at 11/22/22 2032    Lab Results: No results found for this or any previous visit (from the past 48 hour(s)).  Blood Alcohol level:  Lab Results  Component Value Date   ETH <10 123XX123    Metabolic Disorder Labs: Lab Results  Component Value Date   HGBA1C 5.8 (H) 11/17/2022   MPG 120 11/17/2022   MPG 117 07/23/2022   No results found for: "PROLACTIN" Lab Results  Component Value Date   CHOL 129 11/17/2022   TRIG 81 11/17/2022   HDL 65 11/17/2022   CHOLHDL 2.0 11/17/2022   VLDL 16 11/17/2022   LDLCALC 48 11/17/2022   LDLCALC 87 07/23/2022    Physical Findings: AIMS:  , ,  ,  ,    CIWA:    COWS:     Musculoskeletal: Strength & Muscle Tone: within normal limits Gait & Station: normal Patient leans: N/A  Psychiatric Specialty Exam:  Presentation  General Appearance:  Bizarre  Eye Contact: Minimal  Speech: Pressured; Clear and Coherent  Speech Volume: Increased  Handedness: Right   Mood and Affect  Mood: Angry; Irritable  Affect: Inappropriate; Full Range   Thought Process  Thought Processes: Coherent  Descriptions of Associations:Circumstantial  Orientation:Full (Time, Place and Person)  Thought Content:Logical; Obsessions  History of Schizophrenia/Schizoaffective disorder:No  Duration of  Psychotic Symptoms:Less than six months  Hallucinations:No data recorded Ideas of Reference:None  Suicidal Thoughts:No data recorded Homicidal Thoughts:No data recorded  Sensorium  Memory: Immediate Good; Recent Good; Remote Good  Judgment: Poor  Insight: Fair   Community education officer  Concentration: Poor  Attention Span: Poor  Recall: Good  Fund of Knowledge: Good  Language: Good   Psychomotor Activity  Psychomotor Activity:No data recorded  Assets  Assets: Communication Skills; Desire for Improvement; Social Support; Resilience; Physical Health   Sleep  Sleep:No data recorded   Physical Exam: Physical Exam Vitals and nursing note reviewed.  Constitutional:      Appearance: Normal appearance.  HENT:     Head: Normocephalic and atraumatic.     Mouth/Throat:     Pharynx: Oropharynx is clear.  Eyes:     Pupils: Pupils are equal, round, and reactive to light.  Cardiovascular:     Rate and Rhythm: Normal rate and regular rhythm.  Pulmonary:     Effort: Pulmonary effort is normal.     Breath sounds: Normal breath sounds.  Abdominal:     General: Abdomen is flat.     Palpations: Abdomen is soft.  Musculoskeletal:        General: Normal range of motion.  Skin:    General: Skin is warm and dry.  Neurological:     General: No focal deficit present.     Mental Status: He is alert. Mental status is at baseline.  Psychiatric:        Attention and Perception: Attention  normal.        Mood and Affect: Mood normal. Affect is blunt.        Speech: Speech is delayed.        Behavior: Behavior is slowed.        Thought Content: Thought content normal.        Cognition and Memory: Cognition normal.    Review of Systems  Constitutional: Negative.   HENT: Negative.    Eyes: Negative.   Respiratory: Negative.    Cardiovascular: Negative.   Gastrointestinal: Negative.   Musculoskeletal: Negative.   Skin: Negative.   Neurological: Negative.    Psychiatric/Behavioral:  The patient is nervous/anxious.    Blood pressure 118/86, pulse 92, temperature 98.2 F (36.8 C), temperature source Oral, resp. rate 20, height '6\' 5"'$  (1.956 m), weight 102.1 kg, SpO2 100 %. Body mass index is 26.68 kg/m.   Treatment Plan Summary: Medication management and Plan no change to medication.  Check lithium level tomorrow.  Reassurance to the patient.  Encouragement.  Glad that he is attending groups well.  Vital signs appear to be stable.  Alethia Berthold, MD 11/26/2022, 2:39 PM

## 2022-11-26 NOTE — BH IP Treatment Plan (Signed)
Interdisciplinary Treatment and Diagnostic Plan Update  11/26/2022 Time of Session: 8:30am Curtis Hodges MRN: [id number]  Principal Diagnosis: Schizoaffective disorder, bipolar type ([place])  Secondary Diagnoses: Principal Problem:   Schizoaffective disorder, bipolar type ([place]) Active Problems:   Hypothyroidism, unspecified   Unspecified atrial fibrillation ([place])   Personal history of pulmonary embolism   Bilateral edema of lower extremity   Chronic systolic heart failure ([place])   Bipolar 1 disorder ([place])   Current Medications:  Current Facility-Administered Medications  Medication Dose Route Frequency Provider Last Rate Last Admin   acetaminophen (TYLENOL) tablet 650 mg  650 mg Oral Q6H PRN Caroline Sauger, NP   650 mg at 11/21/22 0813   alum & mag hydroxide-simeth (MAALOX/MYLANTA) 200-200-20 MG/5ML suspension 30 mL  30 mL Oral Q4H PRN Caroline Sauger, NP       haloperidol (HALDOL) tablet 5 mg  5 mg Oral Q6H PRN Caroline Sauger, NP       And   benztropine (COGENTIN) tablet 1 mg  1 mg Oral Q6H PRN Caroline Sauger, NP       busPIRone (BUSPAR) tablet 15 mg  15 mg Oral BID Clapacs, Madie Reno, MD   15 mg at 11/26/22 [id number]   diazepam (VALIUM) injection 5 mg  5 mg Intramuscular Q4H PRN Clapacs, Madie Reno, MD       digoxin (LANOXIN) tablet 250 mcg  250 mcg Oral Daily Caroline Sauger, NP   250 mcg at 11/26/22 [id number]   diphenhydrAMINE (BENADRYL) capsule 50 mg  50 mg Oral Q6H PRN Clapacs, John T, MD   50 mg at 11/21/22 [id number]   Or   diphenhydrAMINE (BENADRYL) injection 50 mg  50 mg Intramuscular Q6H PRN Clapacs, John T, MD       haloperidol (HALDOL) tablet 5 mg  5 mg Oral Q6H PRN Clapacs, Madie Reno, MD   5 mg at 11/19/22 1952   Or   haloperidol lactate (HALDOL) injection 5 mg  5 mg Intramuscular Q6H PRN Clapacs, John T, MD       levothyroxine (SYNTHROID) tablet 75 mcg  75 mcg Oral Q0600 Clapacs, Madie Reno, MD   75 mcg at 11/26/22 [id number]   lithium carbonate capsule 300 mg  300 mg Oral BID  WC Clapacs, John T, MD   300 mg at 11/25/22 2030   risperiDONE (RISPERDAL M-TABS) disintegrating tablet 2 mg  2 mg Oral Q8H PRN Caroline Sauger, NP   2 mg at 11/17/22 1146   And   LORazepam (ATIVAN) tablet 1 mg  1 mg Oral PRN Caroline Sauger, NP       LORazepam (ATIVAN) tablet 2 mg  2 mg Oral Q4H PRN Clapacs, John T, MD   2 mg at 11/24/22 [id number]   magnesium hydroxide (MILK OF MAGNESIA) suspension 30 mL  30 mL Oral Daily PRN Caroline Sauger, NP       metoprolol succinate (TOPROL-XL) 24 hr tablet 50 mg  50 mg Oral Daily Parks Ranger, DO   50 mg at 11/26/22 0832   midodrine (PROAMATINE) tablet 5 mg  5 mg Oral BID WC Caroline Sauger, NP   5 mg at 11/26/22 [id number]   neomycin-bacitracin-polymyxin (NEOSPORIN) ointment   Topical Daily Parks Ranger, DO       Oxcarbazepine (TRILEPTAL) tablet 600 mg  600 mg Oral BID Caroline Sauger, NP   600 mg at 11/26/22 [id number]   QUEtiapine (SEROQUEL) tablet 400 mg  400 mg Oral QHS Clapacs, Madie Reno, MD   400 mg at 11/25/22  2100   ramelteon (ROZEREM) tablet 8 mg  8 mg Oral QHS Clapacs, John T, MD   8 mg at 11/25/22 2029   risperiDONE (RISPERDAL M-TABS) disintegrating tablet 1.5 mg  1.5 mg Oral QHS Clapacs, John T, MD   1.5 mg at 11/25/22 2200   risperiDONE (RISPERDAL M-TABS) disintegrating tablet 2 mg  2 mg Oral Q8H PRN Caroline Sauger, NP   2 mg at 11/22/22 2032   rivaroxaban (XARELTO) tablet 20 mg  20 mg Oral q morning Clapacs, John T, MD   20 mg at 11/26/22 [id number]   temazepam (RESTORIL) capsule 15 mg  15 mg Oral QHS PRN Clapacs, Madie Reno, MD   15 mg at 11/22/22 2032   PTA Medications: Medications Prior to Admission  Medication Sig Dispense Refill Last Dose   busPIRone (BUSPAR) 15 MG tablet Take 30 mg by mouth 2 (two) times daily.      digoxin (LANOXIN) 0.25 MG tablet Take 250 mcg by mouth daily.      levothyroxine (SYNTHROID) 75 MCG tablet Take 1 tablet (75 mcg total) by mouth daily. 90 tablet 3    metoprolol succinate  (TOPROL-XL) 50 MG 24 hr tablet Take 50 mg by mouth at bedtime.      midodrine (PROAMATINE) 5 MG tablet Take 5 mg by mouth 2 (two) times daily with a meal.      mirtazapine (REMERON) 30 MG tablet Take 30 mg by mouth at bedtime.      mupirocin ointment (BACTROBAN) 2 % Apply 1 Application topically 2 (two) times daily. 22 g 0    oxcarbazepine (TRILEPTAL) 600 MG tablet Take 600 mg by mouth 2 (two) times daily.      ramelteon (ROZEREM) 8 MG tablet Take 8 mg by mouth at bedtime.      risperiDONE (RISPERDAL M-TABS) 0.5 MG disintegrating tablet Take 0.5 mg by mouth daily.      XARELTO 20 MG TABS tablet Take 20 mg by mouth every morning.       Patient Stressors: Medication change or noncompliance   Traumatic event    Patient Strengths: Motivation for treatment/growth  Supportive family/friends   Treatment Modalities: Medication Management, Group therapy, Case management,  1 to 1 session with clinician, Psychoeducation, Recreational therapy.   Physician Treatment Plan for Primary Diagnosis: Schizoaffective disorder, bipolar type ([place]) Long Term Goal(s): Improvement in symptoms so as ready for discharge   Short Term Goals: Ability to maintain clinical measurements within normal limits will improve Compliance with prescribed medications will improve Ability to verbalize feelings will improve Ability to demonstrate self-control will improve Ability to identify and develop effective coping behaviors will improve  Medication Management: Evaluate patient's response, side effects, and tolerance of medication regimen.  Therapeutic Interventions: 1 to 1 sessions, Unit Group sessions and Medication administration.  Evaluation of Outcomes: Progressing  Physician Treatment Plan for Secondary Diagnosis: Principal Problem:   Schizoaffective disorder, bipolar type ([place]) Active Problems:   Hypothyroidism, unspecified   Unspecified atrial fibrillation ([place])   Personal history of pulmonary embolism    Bilateral edema of lower extremity   Chronic systolic heart failure ([place])   Bipolar 1 disorder ([facility])  Long Term Goal(s): Improvement in symptoms so as ready for discharge   Short Term Goals: Ability to maintain clinical measurements within normal limits will improve Compliance with prescribed medications will improve Ability to verbalize feelings will improve Ability to demonstrate self-control will improve Ability to identify and develop effective coping behaviors will improve     Medication  Management: Evaluate patient's response, side effects, and tolerance of medication regimen.  Therapeutic Interventions: 1 to 1 sessions, Unit Group sessions and Medication administration.  Evaluation of Outcomes: Progressing   RN Treatment Plan for Primary Diagnosis: Schizoaffective disorder, bipolar type ([place]) Long Term Goal(s): Knowledge of disease and therapeutic regimen to maintain health will improve  Short Term Goals: Ability to demonstrate self-control, Ability to participate in decision making will improve, Ability to verbalize feelings will improve, Ability to disclose and discuss suicidal ideas, Ability to identify and develop effective coping behaviors will improve, and Compliance with prescribed medications will improve  Medication Management: RN will administer medications as ordered by provider, will assess and evaluate patient's response and provide education to patient for prescribed medication. RN will report any adverse and/or side effects to prescribing provider.  Therapeutic Interventions: 1 on 1 counseling sessions, Psychoeducation, Medication administration, Evaluate responses to treatment, Monitor vital signs and CBGs as ordered, Perform/monitor CIWA, COWS, AIMS and Fall Risk screenings as ordered, Perform wound care treatments as ordered.  Evaluation of Outcomes: Progressing   LCSW Treatment Plan for Primary Diagnosis: Schizoaffective disorder, bipolar type ([place]) Long Term  Goal(s): Safe transition to appropriate next level of care at discharge, Engage patient in therapeutic group addressing interpersonal concerns.  Short Term Goals: Engage patient in aftercare planning with referrals and resources, Increase social support, Increase ability to appropriately verbalize feelings, Increase emotional regulation, Facilitate acceptance of mental health diagnosis and concerns, and Increase skills for wellness and recovery  Therapeutic Interventions: Assess for all discharge needs, 1 to 1 time with Social worker, Explore available resources and support systems, Assess for adequacy in community support network, Educate family and significant other(s) on suicide prevention, Complete Psychosocial Assessment, Interpersonal group therapy.  Evaluation of Outcomes: Progressing   Progress in Treatment: Attending groups: Yes. Participating in groups: Yes. Taking medication as prescribed: Yes. Toleration medication: Yes. Family/Significant other contact made: Yes, individual(s) contacted:  SPE completed with the patient's mother and sister Patient understands diagnosis: Yes. Discussing patient identified problems/goals with staff: Yes. Medical problems stabilized or resolved: Yes. Denies suicidal/homicidal ideation: Yes. Issues/concerns per patient self-inventory: Yes. Other: none  New problem(s) identified: No, Describe:  none  Update 11/26/2022: No changes at this time.    New Short Term/Long Term Goal(s): Patient to work towards elimination of symptoms of psychosis, medication management for mood stabilization; development of comprehensive mental wellness plan.  Update 11/26/2022: No changes at this time.    Patient Goals: No additional goals identified at this time. Patient to continue to work towards original goals identified in initial treatment team meeting. CSW will remain available to patient should they voice additional treatment goals. Update 11/26/2022: No changes at  this time.    Discharge Plan or Barriers: No psychosocial barriers identified at this time, patient to return to place of residence when appropriate for discharge. Update 11/26/2022: Patient remains somewhat disorganized at this time.  Patient plans to return to his home.  Pt reports that he would like to continue with current provider.    Reason for Continuation of Hospitalization: Medication stabilization Other; describe psychosis   Estimated Length of Stay: 1-7 days  Updated 11/26/2022: No changes at this time.   Last 3 [place] Suicide Severity Risk Score: Flowsheet Row Admission (Current) from 11/15/2022 in [place] [facility] ED from 11/14/2022 in [facility] at [facility] ED from 08/19/2022 in [facility] [place] No Risk No Risk No Risk  Last PHQ 2/9 Scores:    08/22/2022   11:06 AM 07/23/2022    9:46 AM 09/20/2021   10:43 AM  Depression screen PHQ 2/9  Decreased Interest 0 0 0  Down, Depressed, Hopeless 0 0 0  PHQ - 2 Score 0 0 0  Altered sleeping 0 0 0  Tired, decreased energy 0 0 0  Change in appetite 0 0 0  Feeling bad or failure about yourself  0 0 0  Trouble concentrating 0 0 0  Moving slowly or fidgety/restless 0 0 0  Suicidal thoughts 0 0 0  PHQ-9 Score 0 0 0  Difficult doing work/chores Not difficult at all Not difficult at all Not difficult at all    Scribe for Treatment Team: Rozann Lesches, LCSW 11/26/2022 9:10 AM

## 2022-11-26 NOTE — Group Note (Signed)
Recreation Therapy Group Note   Group Topic:Goal Setting  Group Date: 11/26/2022 Start Time: 1000 End Time: 1055 Facilitators: Vilma Prader, LRT, CTRS Location:  Craft Room  Group Description: Scientist, physiological. Patients were given many different magazines, a glue stick, markers, and a piece of cardstock paper. LRT and pts discussed the importance of having goals in life. LRT and pts discussed the difference between short-term and long-term goals. LRT encouraged pts to create a vision board, with images they picked and then cut out by LRT from the magazine, for themselves that capture their short and long-term goals they have for themselves. On the back of the paper, pt encouraged to write 3 different coping skills that can help them reach those goals. LRT encouraged pts to show and explain their vision board to the group once complete. LRT offered to laminate vision board once dry and complete.   Affect/Mood: Appropriate and Congruent   Participation Level: Active and Engaged   Participation Quality: Independent   Behavior: Appropriate and Calm   Speech/Thought Process: Concrete and Coherent   Insight: Fair   Judgement: Fair    Modes of Intervention: Activity and Education   Patient Response to Interventions:  Attentive and Receptive   Education Outcome:  Acknowledges education   Clinical Observations/Individualized Feedback: Ruhan was active in their participation of session activities and group discussion. Pt did not wish to explain what he had put on his vision board, however held it up briefly for everyone to look at. Pt cut out images of flowers and a video game. Pt was calm and quiet during group.    Plan: Continue to engage patient in RT group sessions 2-3x/week.   Vilma Prader, LRT, CTRS 11/26/2022 11:24 AM

## 2022-11-26 NOTE — Progress Notes (Signed)
Patient refused to take Lithium.Stated that he may take tonight. Will notify MD.

## 2022-11-26 NOTE — [facility] Group Notes (Signed)
[place] Group Notes:  (Nursing/MHT/Case Management/Adjunct)  Date:  11/26/2022  Time:  9:38 PM  Type of Therapy:   Wrap up  Participation Level:  Did Not Attend   Summary of Progress/Problems:  Curtis Hodges 11/26/2022, 9:38 PM

## 2022-11-26 NOTE — Group Note (Signed)
[facility] LCSW Group Therapy Note    Group Date: 11/26/2022 Start Time: 1300 End Time: 1400  Type of Therapy and Topic:  Group Therapy:  Overcoming Obstacles  Participation Level:  [facility] PARTICIPATION LEVEL: Minimal  Mood:  Description of Group:   In this group patients will be encouraged to explore what they see as obstacles to their own wellness and recovery. They will be guided to discuss their thoughts, feelings, and behaviors related to these obstacles. The group will process together ways to cope with barriers, with attention given to specific choices patients can make. Each patient will be challenged to identify changes they are motivated to make in order to overcome their obstacles. This group will be process-oriented, with patients participating in exploration of their own experiences as well as giving and receiving support and challenge from other group members.  Therapeutic Goals: 1. Patient will identify personal and current obstacles as they relate to admission. 2. Patient will identify barriers that currently interfere with their wellness or overcoming obstacles.  3. Patient will identify feelings, thought process and behaviors related to these barriers. 4. Patient will identify two changes they are willing to make to overcome these obstacles:    Summary of Patient Progress Patient was present for the entirety of the group process. He participated in the icebreaker but not much outside of that. Pt shared that he was unable to identify a personal obstacle that has gotten in the way of his recovery. Outside of these two things pt did not participate further other than to ask if there was a worksheet for the group.    Therapeutic Modalities:   Cognitive Behavioral Therapy Solution Focused Therapy Motivational Interviewing Relapse Prevention Therapy   Shirl Harris, LCSW

## 2022-11-27 ENCOUNTER — Encounter: Payer: BC Managed Care – PPO | Admitting: Physical Therapy

## 2022-11-27 DIAGNOSIS — F250 Schizoaffective disorder, bipolar type: Secondary | ICD-10-CM | POA: Diagnosis not present

## 2022-11-27 LAB — LITHIUM LEVEL: Lithium Lvl: 0.3 mmol/L — ABNORMAL LOW (ref 0.60–1.20)

## 2022-11-27 MED ORDER — RISPERIDONE 1 MG PO TBDP
1.5000 mg | ORAL_TABLET | Freq: Every day | ORAL | Status: DC
  Administered 2022-11-27 – 2022-12-02 (×6): 1.5 mg via ORAL
  Filled 2022-11-27 (×6): qty 2

## 2022-11-27 MED ORDER — RAMELTEON 8 MG PO TABS
8.0000 mg | ORAL_TABLET | Freq: Every day | ORAL | Status: DC
  Administered 2022-11-27 – 2022-12-02 (×6): 8 mg via ORAL
  Filled 2022-11-27 (×7): qty 1

## 2022-11-27 MED ORDER — MIDODRINE HCL 5 MG PO TABS
5.0000 mg | ORAL_TABLET | Freq: Two times a day (BID) | ORAL | Status: DC
  Administered 2022-11-28 (×2): 5 mg via ORAL
  Filled 2022-11-27 (×3): qty 1

## 2022-11-27 MED ORDER — OXCARBAZEPINE 300 MG PO TABS
600.0000 mg | ORAL_TABLET | Freq: Two times a day (BID) | ORAL | Status: DC
  Administered 2022-11-27 – 2022-12-03 (×12): 600 mg via ORAL
  Filled 2022-11-27 (×13): qty 2

## 2022-11-27 MED ORDER — QUETIAPINE FUMARATE 200 MG PO TABS
400.0000 mg | ORAL_TABLET | Freq: Every day | ORAL | Status: DC
  Administered 2022-11-27 – 2022-12-02 (×6): 400 mg via ORAL
  Filled 2022-11-27 (×6): qty 2

## 2022-11-27 MED ORDER — LEVOTHYROXINE SODIUM 50 MCG PO TABS
75.0000 ug | ORAL_TABLET | Freq: Every day | ORAL | Status: DC
  Administered 2022-11-28 – 2022-12-03 (×6): 75 ug via ORAL
  Filled 2022-11-27 (×6): qty 2

## 2022-11-27 MED ORDER — BUSPIRONE HCL 5 MG PO TABS
15.0000 mg | ORAL_TABLET | Freq: Two times a day (BID) | ORAL | Status: DC
  Administered 2022-11-27 – 2022-12-03 (×12): 15 mg via ORAL
  Filled 2022-11-27 (×13): qty 3

## 2022-11-27 MED ORDER — LITHIUM CARBONATE ER 450 MG PO TBCR
450.0000 mg | EXTENDED_RELEASE_TABLET | Freq: Two times a day (BID) | ORAL | Status: DC
  Administered 2022-11-27 – 2022-12-03 (×12): 450 mg via ORAL
  Filled 2022-11-27 (×12): qty 1

## 2022-11-27 NOTE — Plan of Care (Signed)
D- Patient alert and oriented x 3-4. Affect flat/mood preoccupied. Denies SI/ HI/ AVH. He denies pain. He states "I am planning on going back to my apartment soon, talk with the doctor and go from there".  A- Scheduled medications administered to patient, per MD orders. Support and encouragement provided.  Routine safety checks conducted every 15 minutes.  Patient informed to notify staff with problems or concerns. R- No adverse drug reactions noted. Patient contracts for safety at this time. Patient compliant with medications and treatment plan. Patient receptive, calm, and cooperative. Patient interacts well with others on the unit. He spent some of the shift in the day watching TV. Patient contracts for safety and  remains safe on the unit at this time.

## 2022-11-27 NOTE — Group Note (Signed)
Recreation Therapy Group Note   Group Topic:Healthy Support Systems  Group Date: 11/27/2022 Start Time: 1000 End Time: 1055 Facilitators: Vilma Prader, LRT, CTRS Location:  Craft Room   Group Description: Straw Bridge. Patients were given 10 plastic drinking straws and an equal length of masking tape. Using the materials provided, patients were instructed to build a free-standing bridge-like structure to suspend an everyday item (ex: deck of cards) off the floor or table surface. All materials were required to be used in Conservation officer, nature. LRT facilitated post-activity discussion reviewing the importance of having strong and healthy support systems in our lives. LRT discussed how the people in our lives serve as the tape and the book we placed on top of our straw structure are the stressors we face in daily life. LRT and pts discussed what happens in our life when things get too heavy for [place], and we don't have strong supports outside of the hospital. Pt shared 2 of their healthy supports aloud in the group.   Affect/Mood: Appropriate and Flat   Participation Level: Active and Engaged   Participation Quality: Independent   Behavior: Appropriate and Sedated   Speech/Thought Process: Directed   Insight: Moderate   Judgement: Good   Modes of Intervention: Activity and Guided Discussion   Patient Response to Interventions:  Engaged and Receptive   Education Outcome:  Acknowledges education   Clinical Observations/Individualized Feedback: Curtis Hodges was active in their participation of session activities and group discussion. Pt identified "my family and work" as his healthy support systems. Pt completed the activity with all materials and contributed to group discussion. Pt was noted to be drowsy and falling asleep during discussion. Pt was pleasant and interacted well with peers duration of group.    Plan: Continue to engage patient in RT group sessions 2-3x/week.   Vilma Prader,  LRT, CTRS 11/27/2022 11:15 AM

## 2022-11-27 NOTE — Progress Notes (Signed)
This writer attempted to administer levothyroxine at 6:30 AM; however Pt was adamant that the MD had told him to take it at 8.  This Probation officer educated pt on usual recommendation to take on empty stomach before meals but Pt refused and said he would take at 8.  Oncoming shift will be notified that Pt intends to take at 8 AM.

## 2022-11-27 NOTE — Progress Notes (Signed)
D- Patient alert and oriented x 3-4. Affect flat/mood preoccupied. Denies SI/ HI/ AVH. Denies pain.He states his goal today "continue to work on therapies; read and study". A- Compliant with all scheduled medications, per MD orders. Support and encouragement provided.  Routine safety checks conducted every 15 minutes.  Patient informed to notify staff with problems or concerns. R- No adverse drug reactions noted.  Patient compliant with medications and treatment plan. Patient receptive, calm, and cooperative. Patient consents for safety and remains safe on the unit at this time.

## 2022-11-27 NOTE — Group Note (Signed)
[place] LCSW Group Therapy Note   Group Date: 11/27/2022 Start Time: 1300 End Time: 1400   Type of Therapy/Topic:  Group Therapy:  Emotion Regulation  Participation Level:  Did Not Attend   Description of Group:    The purpose of this group is to assist patients in learning to regulate negative emotions and experience positive emotions. Patients will be guided to discuss ways in which they have been vulnerable to their negative emotions. These vulnerabilities will be juxtaposed with experiences of positive emotions or situations, and patients challenged to use positive emotions to combat negative ones. Special emphasis will be placed on coping with negative emotions in conflict situations, and patients will process healthy conflict resolution skills.  Therapeutic Goals: Patient will identify two positive emotions or experiences to reflect on in order to balance out negative emotions:  Patient will label two or more emotions that they find the most difficult to experience:  Patient will be able to demonstrate positive conflict resolution skills through discussion or role plays:   Summary of Patient Progress: Patient did not attend group despite encouraged participation.     Therapeutic Modalities:   Cognitive Behavioral Therapy Feelings Identification Dialectical Behavioral Therapy   Durenda Hurt, [place]

## 2022-11-27 NOTE — Progress Notes (Signed)
[facility] MD Progress Note  11/27/2022 4:35 PM Curtis Hodges  MRN:  [id number] Subjective: Patient seen and chart reviewed.  Patient appears quite a bit improved especially compared to first presentation.  He is able to sit down and holding very lucid appropriate conversation.  He asked me to review his hemoglobin A1c with him today which we did.  It is only very slightly elevated and I did not recommend any medication treatment.  His lithium level was on the low side.  No side effects reported. Principal Problem: Schizoaffective disorder, bipolar type (HCC) Diagnosis: Principal Problem:   Schizoaffective disorder, bipolar type ([place]) Active Problems:   Hypothyroidism, unspecified   Unspecified atrial fibrillation ([place])   Personal history of pulmonary embolism   Bilateral edema of lower extremity   Chronic systolic heart failure (HCC)   Bipolar 1 disorder (HCC)  Total Time spent with patient: 30 minutes  Past Psychiatric History: Past history of what I think is schizoaffective disorder  Past Medical History:  Past Medical History:  Diagnosis Date   Atrial fibrillation ([place])    Hypertension    Thyroid disease     Past Surgical History:  Procedure Laterality Date   CHOLECYSTECTOMY     GALLBLADDER SURGERY     Family History:  Family History  Problem Relation Age of Onset   COPD Mother    Atrial fibrillation Mother    Brain cancer Father    Family Psychiatric  History: See previous Social History:  Social History   Substance and Sexual Activity  Alcohol Use Not Currently     Social History   Substance and Sexual Activity  Drug Use Never    Social History   Socioeconomic History   Marital status: Single    Spouse name: Not on file   Number of children: Not on file   Years of education: Not on file   Highest education level: Not on file  Occupational History   Not on file  Tobacco Use   Smoking status: Never   Smokeless tobacco: Never  Vaping Use   Vaping Use:  Never used  Substance and Sexual Activity   Alcohol use: Not Currently   Drug use: Never   Sexual activity: Not Currently  Other Topics Concern   Not on file  Social History Narrative   Not on file   Social Determinants of Health   Financial Resource Strain: Low Risk  (07/23/2022)   Overall Financial Resource Strain (CARDIA)    Difficulty of Paying Living Expenses: Not hard at all  Food Insecurity: No Food Insecurity (11/15/2022)   Hunger Vital Sign    Worried About Running Out of Food in the Last Year: Never true    Ran Out of Food in the Last Year: Never true  Transportation Needs: No Transportation Needs (11/15/2022)   PRAPARE - Hydrologist (Medical): No    Lack of Transportation (Non-Medical): No  Physical Activity: Insufficiently Active (07/23/2022)   Exercise Vital Sign    Days of Exercise per Week: 5 days    Minutes of Exercise per Session: 10 min  Stress: No Stress Concern Present (07/23/2022)   [place]    Feeling of Stress : Only a little  Social Connections: Socially Isolated (07/23/2022)   Social Connection and Isolation Panel [NHANES]    Frequency of Communication with Friends and Family: Once a week    Frequency of Social Gatherings with Friends and Family:  Once a week    Attends Religious Services: Never    Active Member of Clubs or Organizations: No    Attends Archivist Meetings: 1 to 4 times per year    Marital Status: Never married   Additional Social History:                         Sleep: Fair  Appetite:  Fair  Current Medications: Current Facility-Administered Medications  Medication Dose Route Frequency Provider Last Rate Last Admin   acetaminophen (TYLENOL) tablet 650 mg  650 mg Oral Q6H PRN Caroline Sauger, NP   650 mg at 11/21/22 0813   alum & mag hydroxide-simeth (MAALOX/MYLANTA) 200-200-20 MG/5ML suspension 30 mL  30 mL Oral  Q4H PRN Caroline Sauger, NP       haloperidol (HALDOL) tablet 5 mg  5 mg Oral Q6H PRN Caroline Sauger, NP       And   benztropine (COGENTIN) tablet 1 mg  1 mg Oral Q6H PRN Caroline Sauger, NP       busPIRone (BUSPAR) tablet 15 mg  15 mg Oral BID Taquan Bralley T, MD       diazepam (VALIUM) injection 5 mg  5 mg Intramuscular Q4H PRN Iver Miklas, Madie Reno, MD       digoxin (LANOXIN) tablet 250 mcg  250 mcg Oral Daily Caroline Sauger, NP   250 mcg at 11/27/22 0841   diphenhydrAMINE (BENADRYL) capsule 50 mg  50 mg Oral Q6H PRN Fardeen Steinberger T, MD   50 mg at 11/21/22 [id number]   Or   diphenhydrAMINE (BENADRYL) injection 50 mg  50 mg Intramuscular Q6H PRN Real Cona T, MD       haloperidol (HALDOL) tablet 5 mg  5 mg Oral Q6H PRN Ji Feldner T, MD   5 mg at 11/19/22 1952   Or   haloperidol lactate (HALDOL) injection 5 mg  5 mg Intramuscular Q6H PRN Chinwe Lope, Madie Reno, MD       [START ON 11/28/2022] levothyroxine (SYNTHROID) tablet 75 mcg  75 mcg Oral Q0600 [place] Cantero, Madie Reno, MD       lithium carbonate (ESKALITH) ER tablet 450 mg  450 mg Oral Q12H Maysel Mccolm T, MD       risperiDONE (RISPERDAL M-TABS) disintegrating tablet 2 mg  2 mg Oral Q8H PRN Caroline Sauger, NP   2 mg at 11/17/22 1146   And   LORazepam (ATIVAN) tablet 1 mg  1 mg Oral PRN Caroline Sauger, NP       LORazepam (ATIVAN) tablet 2 mg  2 mg Oral Q4H PRN Engelbert Sevin T, MD   2 mg at 11/26/22 2155   magnesium hydroxide (MILK OF MAGNESIA) suspension 30 mL  30 mL Oral Daily PRN Caroline Sauger, NP       metoprolol succinate (TOPROL-XL) 24 hr tablet 50 mg  50 mg Oral Daily Parks Ranger, DO   50 mg at 11/27/22 0841   midodrine (PROAMATINE) tablet 5 mg  5 mg Oral BID AC Ramisa Duman, Madie Reno, MD       neomycin-bacitracin-polymyxin (NEOSPORIN) ointment   Topical Daily Parks Ranger, DO   Given at 11/27/22 [id number]   Oxcarbazepine (TRILEPTAL) tablet 600 mg  600 mg Oral BID Alinah Sheard, Madie Reno, MD       QUEtiapine  (SEROQUEL) tablet 400 mg  400 mg Oral QHS Kieryn Burtis T, MD       ramelteon (ROZEREM) tablet 8 mg  8 mg  Oral QHS Colie Fugitt T, MD       risperiDONE (RISPERDAL M-TABS) disintegrating tablet 1.5 mg  1.5 mg Oral QHS Jack Mineau T, MD       risperiDONE (RISPERDAL M-TABS) disintegrating tablet 2 mg  2 mg Oral Q8H PRN Caroline Sauger, NP   2 mg at 11/22/22 2032   rivaroxaban (XARELTO) tablet 20 mg  20 mg Oral q morning Breeonna Mone T, MD   20 mg at 11/27/22 0841   temazepam (RESTORIL) capsule 15 mg  15 mg Oral QHS PRN Jasn Xia, Madie Reno, MD   15 mg at 11/22/22 2032    Lab Results:  Results for orders placed or performed during the hospital encounter of 11/15/22 (from the past 48 hour(s))  Lithium level     Status: Abnormal   Collection Time: 11/27/22  9:56 AM  Result Value Ref Range   Lithium Lvl 0.30 (L) 0.60 - 1.20 mmol/L    Comment: Performed at [facility], [address] [place], [place] [address]    Blood Alcohol level:  Lab Results  Component Value Date   [facility] <10 [id number]    Metabolic Disorder Labs: Lab Results  Component Value Date   HGBA1C 5.8 (H) 11/17/2022   MPG 120 11/17/2022   MPG 117 07/23/2022   No results found for: "PROLACTIN" Lab Results  Component Value Date   CHOL 129 11/17/2022   TRIG 81 11/17/2022   HDL 65 11/17/2022   CHOLHDL 2.0 11/17/2022   VLDL 16 11/17/2022   LDLCALC 48 11/17/2022   LDLCALC 87 07/23/2022    Physical Findings: AIMS:  , ,  ,  ,    CIWA:    COWS:     Musculoskeletal: Strength & Muscle Tone: within normal limits Gait & Station: normal Patient leans: N/A  Psychiatric Specialty Exam:  Presentation  General Appearance:  Bizarre  Eye Contact: Minimal  Speech: Pressured; Clear and Coherent  Speech Volume: Increased  Handedness: Right   Mood and Affect  Mood: Angry; Irritable  Affect: Inappropriate; Full Range   Thought Process  Thought Processes: Coherent  Descriptions of  Associations:Circumstantial  Orientation:Full (Time, Place and Person)  Thought Content:Logical; Obsessions  History of Schizophrenia/Schizoaffective disorder:No  Duration of Psychotic Symptoms:Less than six months  Hallucinations:No data recorded Ideas of Reference:None  Suicidal Thoughts:No data recorded Homicidal Thoughts:No data recorded  Sensorium  Memory: Immediate Good; Recent Good; Remote Good  Judgment: Poor  Insight: Fair   Community education officer  Concentration: Poor  Attention Span: Poor  Recall: Good  Fund of Knowledge: Good  Language: Good   Psychomotor Activity  Psychomotor Activity:No data recorded  Assets  Assets: Communication Skills; Desire for Improvement; Social Support; Resilience; Physical Health   Sleep  Sleep:No data recorded   Physical Exam: Physical Exam Vitals reviewed.  Constitutional:      Appearance: Normal appearance.  HENT:     Head: Normocephalic and atraumatic.     Mouth/Throat:     Pharynx: Oropharynx is clear.  Eyes:     Pupils: Pupils are equal, round, and reactive to light.  Cardiovascular:     Rate and Rhythm: Normal rate and regular rhythm.  Pulmonary:     Effort: Pulmonary effort is normal.     Breath sounds: Normal breath sounds.  Abdominal:     General: Abdomen is flat.     Palpations: Abdomen is soft.  Musculoskeletal:        General: Normal range of motion.  Skin:    General:  Skin is warm and dry.  Neurological:     General: No focal deficit present.     Mental Status: He is alert. Mental status is at baseline.  Psychiatric:        Attention and Perception: Attention normal.        Mood and Affect: Mood normal. Affect is blunt.        Speech: Speech is delayed.        Behavior: Behavior is slowed.        Thought Content: Thought content normal. Thought content does not include suicidal ideation.        Judgment: Judgment normal.    Review of Systems  Constitutional: Negative.   HENT:  Negative.    Eyes: Negative.   Respiratory: Negative.    Cardiovascular: Negative.   Gastrointestinal: Negative.   Musculoskeletal: Negative.   Skin: Negative.   Neurological: Negative.   Psychiatric/Behavioral:  Negative for depression, hallucinations, substance abuse and suicidal ideas. The patient is nervous/anxious.    Blood pressure 123/73, pulse 90, temperature 98.2 F (36.8 C), temperature source Oral, resp. rate 16, height '6\' 5"'$  (1.956 m), weight 102.1 kg, SpO2 100 %. Body mass index is 26.68 kg/m.   Treatment Plan Summary: Patient seems to be showing improvement.  Increased dose of lithium to 450 mg twice a day.  We adjusted medicine at his request so that the evening doses will be given at 8:00 rather than 5:00.  Alethia Berthold, MD 11/27/2022, 4:35 PM

## 2022-11-28 DIAGNOSIS — F250 Schizoaffective disorder, bipolar type: Secondary | ICD-10-CM | POA: Diagnosis not present

## 2022-11-28 MED ORDER — MIDODRINE HCL 5 MG PO TABS
5.0000 mg | ORAL_TABLET | Freq: Two times a day (BID) | ORAL | Status: DC
  Administered 2022-11-29 – 2022-12-03 (×9): 5 mg via ORAL
  Filled 2022-11-28 (×10): qty 1

## 2022-11-28 NOTE — Progress Notes (Signed)
[facility] MD Progress Note  11/28/2022 1:44 PM Curtis Hodges  MRN:  [id number] Subjective: Patient seen for follow-up.  52 year old man with schizoaffective disorder.  Patient is showing improvement.  Able to sit still and hold a lucid conversation.  Able to ask appropriate questions about his lab results and display the ability to understand them.  Tolerating medicine well.  No aggression. Principal Problem: Schizoaffective disorder, bipolar type (HCC) Diagnosis: Principal Problem:   Schizoaffective disorder, bipolar type ([place]) Active Problems:   Hypothyroidism, unspecified   Unspecified atrial fibrillation ([place])   Personal history of pulmonary embolism   Bilateral edema of lower extremity   Chronic systolic heart failure (HCC)   Bipolar 1 disorder (HCC)  Total Time spent with patient: 30 minutes  Past Psychiatric History: Past history of what I believe is schizoaffective disorder  Past Medical History:  Past Medical History:  Diagnosis Date   Atrial fibrillation ([place])    Hypertension    Thyroid disease     Past Surgical History:  Procedure Laterality Date   CHOLECYSTECTOMY     GALLBLADDER SURGERY     Family History:  Family History  Problem Relation Age of Onset   COPD Mother    Atrial fibrillation Mother    Brain cancer Father    Family Psychiatric  History: See previous Social History:  Social History   Substance and Sexual Activity  Alcohol Use Not Currently     Social History   Substance and Sexual Activity  Drug Use Never    Social History   Socioeconomic History   Marital status: Single    Spouse name: Not on file   Number of children: Not on file   Years of education: Not on file   Highest education level: Not on file  Occupational History   Not on file  Tobacco Use   Smoking status: Never   Smokeless tobacco: Never  Vaping Use   Vaping Use: Never used  Substance and Sexual Activity   Alcohol use: Not Currently   Drug use: Never   Sexual  activity: Not Currently  Other Topics Concern   Not on file  Social History Narrative   Not on file   Social Determinants of Health   Financial Resource Strain: Low Risk  (07/23/2022)   Overall Financial Resource Strain (CARDIA)    Difficulty of Paying Living Expenses: Not hard at all  Food Insecurity: No Food Insecurity (11/15/2022)   Hunger Vital Sign    Worried About Running Out of Food in the Last Year: Never true    Ran Out of Food in the Last Year: Never true  Transportation Needs: No Transportation Needs (11/15/2022)   PRAPARE - Hydrologist (Medical): No    Lack of Transportation (Non-Medical): No  Physical Activity: Insufficiently Active (07/23/2022)   Exercise Vital Sign    Days of Exercise per Week: 5 days    Minutes of Exercise per Session: 10 min  Stress: No Stress Concern Present (07/23/2022)   [place]    Feeling of Stress : Only a little  Social Connections: Socially Isolated (07/23/2022)   Social Connection and Isolation Panel [NHANES]    Frequency of Communication with Friends and Family: Once a week    Frequency of Social Gatherings with Friends and Family: Once a week    Attends Religious Services: Never    Marine scientist or Organizations: No    Attends [employer]  or Organization Meetings: 1 to 4 times per year    Marital Status: Never married   Additional Social History:                         Sleep: Fair  Appetite:  Fair  Current Medications: Current Facility-Administered Medications  Medication Dose Route Frequency Provider Last Rate Last Admin   acetaminophen (TYLENOL) tablet 650 mg  650 mg Oral Q6H PRN Caroline Sauger, NP   650 mg at 11/21/22 0813   alum & mag hydroxide-simeth (MAALOX/MYLANTA) 200-200-20 MG/5ML suspension 30 mL  30 mL Oral Q4H PRN Caroline Sauger, NP       haloperidol (HALDOL) tablet 5 mg  5 mg Oral Q6H PRN Caroline Sauger, NP       And   benztropine (COGENTIN) tablet 1 mg  1 mg Oral Q6H PRN Caroline Sauger, NP       busPIRone (BUSPAR) tablet 15 mg  15 mg Oral BID Anthonette Lesage, Madie Reno, MD   15 mg at 11/28/22 0827   diazepam (VALIUM) injection 5 mg  5 mg Intramuscular Q4H PRN Tanji Storrs, Madie Reno, MD       digoxin (LANOXIN) tablet 250 mcg  250 mcg Oral Daily Caroline Sauger, NP   250 mcg at 11/28/22 [id number]   diphenhydrAMINE (BENADRYL) capsule 50 mg  50 mg Oral Q6H PRN Gabbi Whetstone T, MD   50 mg at 11/21/22 [id number]   Or   diphenhydrAMINE (BENADRYL) injection 50 mg  50 mg Intramuscular Q6H PRN Illias Pantano T, MD       haloperidol (HALDOL) tablet 5 mg  5 mg Oral Q6H PRN Baltazar Pekala T, MD   5 mg at 11/19/22 1952   Or   haloperidol lactate (HALDOL) injection 5 mg  5 mg Intramuscular Q6H PRN Hatsuko Bizzarro T, MD       levothyroxine (SYNTHROID) tablet 75 mcg  75 mcg Oral Q0600 Kayleanna Lorman, Madie Reno, MD   75 mcg at 11/28/22 0829   lithium carbonate (ESKALITH) ER tablet 450 mg  450 mg Oral Q12H Vienne Corcoran T, MD   450 mg at 11/28/22 [id number]   risperiDONE (RISPERDAL M-TABS) disintegrating tablet 2 mg  2 mg Oral Q8H PRN Caroline Sauger, NP   2 mg at 11/17/22 1146   And   LORazepam (ATIVAN) tablet 1 mg  1 mg Oral PRN Caroline Sauger, NP       LORazepam (ATIVAN) tablet 2 mg  2 mg Oral Q4H PRN Guadalupe Kerekes T, MD   2 mg at 11/26/22 2155   magnesium hydroxide (MILK OF MAGNESIA) suspension 30 mL  30 mL Oral Daily PRN Caroline Sauger, NP       metoprolol succinate (TOPROL-XL) 24 hr tablet 50 mg  50 mg Oral Daily Parks Ranger, DO   50 mg at 11/28/22 0829   midodrine (PROAMATINE) tablet 5 mg  5 mg Oral BID AC Keanan Melander, Madie Reno, MD   5 mg at 11/28/22 0827   neomycin-bacitracin-polymyxin (NEOSPORIN) ointment   Topical Daily Parks Ranger, DO   Given at 11/27/22 [id number]   Oxcarbazepine (TRILEPTAL) tablet 600 mg  600 mg Oral BID Kashae Carstens T, MD   600 mg at 11/28/22 0830   QUEtiapine (SEROQUEL)  tablet 400 mg  400 mg Oral QHS Wynonna Fitzhenry T, MD   400 mg at 11/27/22 2023   ramelteon (ROZEREM) tablet 8 mg  8 mg Oral QHS Tyberius Ryner, Madie Reno, MD   8 mg  at 11/27/22 2024   risperiDONE (RISPERDAL M-TABS) disintegrating tablet 1.5 mg  1.5 mg Oral QHS Nadiah Corbit T, MD   1.5 mg at 11/27/22 2023   risperiDONE (RISPERDAL M-TABS) disintegrating tablet 2 mg  2 mg Oral Q8H PRN Caroline Sauger, NP   2 mg at 11/22/22 2032   rivaroxaban (XARELTO) tablet 20 mg  20 mg Oral q morning Tremond Shimabukuro, Madie Reno, MD   20 mg at 11/28/22 [id number]    Lab Results:  Results for orders placed or performed during the hospital encounter of 11/15/22 (from the past 48 hour(s))  Lithium level     Status: Abnormal   Collection Time: 11/27/22  9:56 AM  Result Value Ref Range   Lithium Lvl 0.30 (L) 0.60 - 1.20 mmol/L    Comment: Performed at [facility], [address] [place], [place] [address]    Blood Alcohol level:  Lab Results  Component Value Date   [facility] <10 [id number]    Metabolic Disorder Labs: Lab Results  Component Value Date   HGBA1C 5.8 (H) 11/17/2022   MPG 120 11/17/2022   MPG 117 07/23/2022   No results found for: "PROLACTIN" Lab Results  Component Value Date   CHOL 129 11/17/2022   TRIG 81 11/17/2022   HDL 65 11/17/2022   CHOLHDL 2.0 11/17/2022   VLDL 16 11/17/2022   LDLCALC 48 11/17/2022   LDLCALC 87 07/23/2022    Physical Findings: AIMS:  , ,  ,  ,    CIWA:    COWS:     Musculoskeletal: Strength & Muscle Tone: within normal limits Gait & Station: normal Patient leans: N/A  Psychiatric Specialty Exam:  Presentation  General Appearance:  Bizarre  Eye Contact: Minimal  Speech: Pressured; Clear and Coherent  Speech Volume: Increased  Handedness: Right   Mood and Affect  Mood: Angry; Irritable  Affect: Inappropriate; Full Range   Thought Process  Thought Processes: Coherent  Descriptions of Associations:Circumstantial  Orientation:Full (Time, Place  and Person)  Thought Content:Logical; Obsessions  History of Schizophrenia/Schizoaffective disorder:No  Duration of Psychotic Symptoms:Less than six months  Hallucinations:No data recorded Ideas of Reference:None  Suicidal Thoughts:No data recorded Homicidal Thoughts:No data recorded  Sensorium  Memory: Immediate Good; Recent Good; Remote Good  Judgment: Poor  Insight: Fair   Community education officer  Concentration: Poor  Attention Span: Poor  Recall: Good  Fund of Knowledge: Good  Language: Good   Psychomotor Activity  Psychomotor Activity:No data recorded  Assets  Assets: Communication Skills; Desire for Improvement; Social Support; Resilience; Physical Health   Sleep  Sleep:No data recorded   Physical Exam: Physical Exam Vitals and nursing note reviewed.  Constitutional:      Appearance: Normal appearance.  HENT:     Head: Normocephalic and atraumatic.     Mouth/Throat:     Pharynx: Oropharynx is clear.  Eyes:     Pupils: Pupils are equal, round, and reactive to light.  Cardiovascular:     Rate and Rhythm: Normal rate and regular rhythm.  Pulmonary:     Effort: Pulmonary effort is normal.     Breath sounds: Normal breath sounds.  Abdominal:     General: Abdomen is flat.     Palpations: Abdomen is soft.  Musculoskeletal:        General: Normal range of motion.  Skin:    General: Skin is warm and dry.  Neurological:     General: No focal deficit present.     Mental Status: He is alert.  Mental status is at baseline.  Psychiatric:        Attention and Perception: He is inattentive.        Mood and Affect: Mood normal. Affect is blunt.        Speech: Speech is delayed.        Behavior: Behavior is slowed.        Thought Content: Thought content normal.        Cognition and Memory: Cognition is impaired.    Review of Systems  Constitutional: Negative.   HENT: Negative.    Eyes: Negative.   Respiratory: Negative.    Cardiovascular:  Negative.   Gastrointestinal: Negative.   Musculoskeletal: Negative.   Skin: Negative.   Neurological: Negative.   Psychiatric/Behavioral:  The patient is nervous/anxious.    Blood pressure 112/78, pulse 80, temperature 98 F (36.7 C), temperature source Oral, resp. rate 16, height '6\' 5"'$  (1.956 m), weight 102.1 kg, SpO2 100 %. Body mass index is 26.68 kg/m.   Treatment Plan Summary: Medication management and Plan no change to medicine today.  We are going to try and make sure that he will be able to get follow-up with his previous outpatient psychiatrist.  Otherwise continue including him in groups.  Ongoing daily assessment I think we are getting closer to discharge.  Alethia Berthold, MD 11/28/2022, 1:44 PM

## 2022-11-28 NOTE — Plan of Care (Signed)
?  Problem: Coping: ?Goal: Level of anxiety will decrease ?Outcome: Progressing ?  ?Problem: Safety: ?Goal: Ability to remain free from injury will improve ?Outcome: Progressing ?  ?

## 2022-11-28 NOTE — Group Note (Signed)
LCSW Group Therapy Note   Group Date: 11/28/2022 Start Time: 1300 End Time: 1400   Type of Therapy and Topic:  Group Therapy: Boundaries  Participation Level:  None  Description of Group: This group will address the use of boundaries in their personal lives. Patients will explore why boundaries are important, the difference between healthy and unhealthy boundaries, and negative and postive outcomes of different boundaries and will look at how boundaries can be crossed.  Patients will be encouraged to identify current boundaries in their own lives and identify what kind of boundary is being set. Facilitators will guide patients in utilizing problem-solving interventions to address and correct types boundaries being used and to address when no boundary is being used. Understanding and applying boundaries will be explored and addressed for obtaining and maintaining a balanced life. Patients will be encouraged to explore ways to assertively make their boundaries and needs known to significant others in their lives, using other group members and facilitator for role play, support, and feedback.  Therapeutic Goals:  1.  Patient will identify areas in their life where setting clear boundaries could be  used to improve their life.  2.  Patient will identify signs/triggers that a boundary is not being respected. 3.  Patient will identify two ways to set boundaries in order to achieve balance in  their lives: 4.  Patient will demonstrate ability to communicate their needs and set boundaries  through discussion and/or role plays  Summary of Patient Progress:   Patient was present throughout the session.  Patient did not engage in group discussions, however, appeared attentive.   Therapeutic Modalities:   Cognitive Behavioral Therapy Solution-Focused Therapy  Rozann Lesches, LCSWA 11/28/2022  2:10 PM

## 2022-11-28 NOTE — Progress Notes (Signed)
Patient presents with flat affect, remains flat on approach. Denies SI, Hi, AVH. Request to take meds at 8pm. Irritable this shift. Took meds and went to bed. Refused sleep medication, would like to talk to doctor about it. Has list of meds he checks off in notebook while giving. Minimal interaction with staff and peers.  Encouragement and support provided. Safety checks maintained. Medications given as prescribed. Pt receptive and remains safe on unit with q 15 min checks.

## 2022-11-28 NOTE — Plan of Care (Signed)
D: Patient alert and oriented. Patient denies pain. Patient denies depression. Patient endorses anxiety. Patient denies SI/HI/AVH. Patient did refuse scheduled 1700 medication stating that he only takes his medications at 8.   A: Scheduled medications administered to patient, per MD orders.  Support and encouragement provided to patient.  Q15 minute safety checks maintained.   R: Patient compliant with medication administration and treatment plan. No adverse drug reactions noted. Patient remains safe on the unit at this time. Problem: Education: Goal: Knowledge of [place] [facility] Education information/materials will improve Outcome: Progressing Goal: Verbalization of understanding the information provided will improve Outcome: Progressing   Problem: Health Behavior/Discharge Planning: Goal: Compliance with treatment plan for underlying cause of condition will improve Outcome: Progressing   Problem: Physical Regulation: Goal: Ability to maintain clinical measurements within normal limits will improve Outcome: Progressing

## 2022-11-28 NOTE — Group Note (Signed)
Recreation Therapy Group Note   Group Topic:Relaxation  Group Date: 11/28/2022 Start Time: 1000 End Time: 1045 Facilitators: Vilma Prader, LRT, CTRS Location:  Craft Room  Group Description: Mindfulness Body Scan. LRT asked pt their current level of stress and anxiety. LRT educated on the benefits of mindfulness and how it can apply to everyday life post-discharge. LRT and pt's followed along to an audio script of a "mindfulness body scan" video. LRT asked pt their level of stress and anxiety once the prompt was finished.   Affect/Mood: Appropriate and Congruent   Participation Level: Active and Engaged   Participation Quality: Independent   Behavior: Alert, Appropriate, and Calm   Speech/Thought Process: Coherent   Insight: Good   Judgement: Good   Modes of Intervention: Activity and Education   Patient Response to Interventions:  Attentive, Engaged, Interested , and Receptive   Education Outcome:  Acknowledges education   Clinical Observations/Individualized Feedback: Curtis Hodges was active in their participation of session activities and group discussion. Pt identified that his anxiety was a 3 and his stress was a 10, on a scale of 1-10 with 10 being the highest. After the session, he rated his anxiety a 1 and his stress an 8 out of 10. Pt followed along and completed all prompted exercises during session.   Plan: Continue to engage patient in RT group sessions 2-3x/week.   Vilma Prader, LRT, CTRS 11/28/2022 11:22 AM

## 2022-11-29 DIAGNOSIS — F250 Schizoaffective disorder, bipolar type: Secondary | ICD-10-CM | POA: Diagnosis not present

## 2022-11-29 NOTE — Progress Notes (Signed)
[facility] MD Progress Note  11/29/2022 12:40 PM Curtis Hodges  MRN:  [id number] Subjective: Follow-up 52 year old man with schizoaffective disorder.  Patient seen today.  He was happy to hear that his outpatient psychiatrist will continue to see him.  Patient did not have any new questions for me.  Has not been aggressive violent or behaving in any bizarre behavior.  Appears to be tolerating medicine well.  No complaints of any typical lithium toxicity. Principal Problem: Schizoaffective disorder, bipolar type (HCC) Diagnosis: Principal Problem:   Schizoaffective disorder, bipolar type ([place]) Active Problems:   Hypothyroidism, unspecified   Unspecified atrial fibrillation ([place])   Personal history of pulmonary embolism   Bilateral edema of lower extremity   Chronic systolic heart failure (HCC)   Bipolar 1 disorder (HCC)  Total Time spent with patient: 30 minutes  Past Psychiatric History: Past history of schizoaffective disorder  Past Medical History:  Past Medical History:  Diagnosis Date   Atrial fibrillation ([place])    Hypertension    Thyroid disease     Past Surgical History:  Procedure Laterality Date   CHOLECYSTECTOMY     GALLBLADDER SURGERY     Family History:  Family History  Problem Relation Age of Onset   COPD Mother    Atrial fibrillation Mother    Brain cancer Father    Family Psychiatric  History: See previous Social History:  Social History   Substance and Sexual Activity  Alcohol Use Not Currently     Social History   Substance and Sexual Activity  Drug Use Never    Social History   Socioeconomic History   Marital status: Single    Spouse name: Not on file   Number of children: Not on file   Years of education: Not on file   Highest education level: Not on file  Occupational History   Not on file  Tobacco Use   Smoking status: Never   Smokeless tobacco: Never  Vaping Use   Vaping Use: Never used  Substance and Sexual Activity   Alcohol use: Not  Currently   Drug use: Never   Sexual activity: Not Currently  Other Topics Concern   Not on file  Social History Narrative   Not on file   Social Determinants of Health   Financial Resource Strain: Low Risk  (07/23/2022)   Overall Financial Resource Strain (CARDIA)    Difficulty of Paying Living Expenses: Not hard at all  Food Insecurity: No Food Insecurity (11/15/2022)   Hunger Vital Sign    Worried About Running Out of Food in the Last Year: Never true    Ran Out of Food in the Last Year: Never true  Transportation Needs: No Transportation Needs (11/15/2022)   PRAPARE - Hydrologist (Medical): No    Lack of Transportation (Non-Medical): No  Physical Activity: Insufficiently Active (07/23/2022)   Exercise Vital Sign    Days of Exercise per Week: 5 days    Minutes of Exercise per Session: 10 min  Stress: No Stress Concern Present (07/23/2022)   [place]    Feeling of Stress : Only a little  Social Connections: Socially Isolated (07/23/2022)   Social Connection and Isolation Panel [NHANES]    Frequency of Communication with Friends and Family: Once a week    Frequency of Social Gatherings with Friends and Family: Once a week    Attends Religious Services: Never    Retail buyer of  Clubs or Organizations: No    Attends Music therapist: 1 to 4 times per year    Marital Status: Never married   Additional Social History:                         Sleep: Fair  Appetite:  Fair  Current Medications: Current Facility-Administered Medications  Medication Dose Route Frequency Provider Last Rate Last Admin   acetaminophen (TYLENOL) tablet 650 mg  650 mg Oral Q6H PRN Caroline Sauger, NP   650 mg at 11/21/22 0813   alum & mag hydroxide-simeth (MAALOX/MYLANTA) 200-200-20 MG/5ML suspension 30 mL  30 mL Oral Q4H PRN Caroline Sauger, NP       haloperidol (HALDOL)  tablet 5 mg  5 mg Oral Q6H PRN Caroline Sauger, NP       And   benztropine (COGENTIN) tablet 1 mg  1 mg Oral Q6H PRN Caroline Sauger, NP       busPIRone (BUSPAR) tablet 15 mg  15 mg Oral BID Nikea Settle T, MD   15 mg at 11/29/22 [id number]   diazepam (VALIUM) injection 5 mg  5 mg Intramuscular Q4H PRN Jaqueline Uber, Madie Reno, MD       diphenhydrAMINE (BENADRYL) capsule 50 mg  50 mg Oral Q6H PRN Milburn Freeney T, MD   50 mg at 11/21/22 [id number]   Or   diphenhydrAMINE (BENADRYL) injection 50 mg  50 mg Intramuscular Q6H PRN Becky Colan T, MD       haloperidol (HALDOL) tablet 5 mg  5 mg Oral Q6H PRN Kaarin Pardy T, MD   5 mg at 11/19/22 1952   Or   haloperidol lactate (HALDOL) injection 5 mg  5 mg Intramuscular Q6H PRN Shaletha Humble T, MD       levothyroxine (SYNTHROID) tablet 75 mcg  75 mcg Oral Q0600 Kamarie Palma, Madie Reno, MD   75 mcg at 11/29/22 [id number]   lithium carbonate (ESKALITH) ER tablet 450 mg  450 mg Oral Q12H Ernie Kasler T, MD   450 mg at 11/29/22 [id number]   risperiDONE (RISPERDAL M-TABS) disintegrating tablet 2 mg  2 mg Oral Q8H PRN Caroline Sauger, NP   2 mg at 11/17/22 1146   And   LORazepam (ATIVAN) tablet 1 mg  1 mg Oral PRN Caroline Sauger, NP       LORazepam (ATIVAN) tablet 2 mg  2 mg Oral Q4H PRN Analya Louissaint T, MD   2 mg at 11/26/22 2155   magnesium hydroxide (MILK OF MAGNESIA) suspension 30 mL  30 mL Oral Daily PRN Caroline Sauger, NP       metoprolol succinate (TOPROL-XL) 24 hr tablet 50 mg  50 mg Oral Daily Parks Ranger, DO   50 mg at 11/29/22 0824   midodrine (PROAMATINE) tablet 5 mg  5 mg Oral BID Kiki Bivens, Madie Reno, MD   5 mg at 11/29/22 [id number]   neomycin-bacitracin-polymyxin (NEOSPORIN) ointment   Topical Daily Parks Ranger, DO   Given at 11/27/22 [id number]   Oxcarbazepine (TRILEPTAL) tablet 600 mg  600 mg Oral BID Nayeliz Hipp, Madie Reno, MD   600 mg at 11/29/22 [id number]   QUEtiapine (SEROQUEL) tablet 400 mg  400 mg Oral QHS Taha Dimond T, MD   400 mg at 11/28/22  1956   ramelteon (ROZEREM) tablet 8 mg  8 mg Oral QHS Lusia Greis T, MD   8 mg at 11/28/22 1956   risperiDONE (RISPERDAL M-TABS) disintegrating tablet 1.5 mg  1.5  mg Oral QHS Colbi Staubs T, MD   1.5 mg at 11/28/22 1957   risperiDONE (RISPERDAL M-TABS) disintegrating tablet 2 mg  2 mg Oral Q8H PRN Caroline Sauger, NP   2 mg at 11/22/22 2032   rivaroxaban (XARELTO) tablet 20 mg  20 mg Oral q morning Ruhi Kopke, Madie Reno, MD   20 mg at 11/29/22 [id number]    Lab Results: No results found for this or any previous visit (from the past 48 hour(s)).  Blood Alcohol level:  Lab Results  Component Value Date   ETH <10 [id number]    Metabolic Disorder Labs: Lab Results  Component Value Date   HGBA1C 5.8 (H) 11/17/2022   MPG 120 11/17/2022   MPG 117 07/23/2022   No results found for: "PROLACTIN" Lab Results  Component Value Date   CHOL 129 11/17/2022   TRIG 81 11/17/2022   HDL 65 11/17/2022   CHOLHDL 2.0 11/17/2022   VLDL 16 11/17/2022   LDLCALC 48 11/17/2022   LDLCALC 87 07/23/2022    Physical Findings: AIMS:  , ,  ,  ,    CIWA:    COWS:     Musculoskeletal: Strength & Muscle Tone: within normal limits Gait & Station: normal Patient leans: N/A  Psychiatric Specialty Exam:  Presentation  General Appearance:  Bizarre  Eye Contact: Minimal  Speech: Pressured; Clear and Coherent  Speech Volume: Increased  Handedness: Right   Mood and Affect  Mood: Angry; Irritable  Affect: Inappropriate; Full Range   Thought Process  Thought Processes: Coherent  Descriptions of Associations:Circumstantial  Orientation:Full (Time, Place and Person)  Thought Content:Logical; Obsessions  History of Schizophrenia/Schizoaffective disorder:No  Duration of Psychotic Symptoms:Less than six months  Hallucinations:No data recorded Ideas of Reference:None  Suicidal Thoughts:No data recorded Homicidal Thoughts:No data recorded  Sensorium  Memory: Immediate Good; Recent  Good; Remote Good  Judgment: Poor  Insight: Fair   Community education officer  Concentration: Poor  Attention Span: Poor  Recall: Good  Fund of Knowledge: Good  Language: Good   Psychomotor Activity  Psychomotor Activity:No data recorded  Assets  Assets: Communication Skills; Desire for Improvement; Social Support; Resilience; Physical Health   Sleep  Sleep:No data recorded   Physical Exam: Physical Exam Vitals and nursing note reviewed.  Constitutional:      Appearance: Normal appearance.  HENT:     Head: Normocephalic and atraumatic.     Mouth/Throat:     Pharynx: Oropharynx is clear.  Eyes:     Pupils: Pupils are equal, round, and reactive to light.  Cardiovascular:     Rate and Rhythm: Normal rate and regular rhythm.  Pulmonary:     Effort: Pulmonary effort is normal.     Breath sounds: Normal breath sounds.  Abdominal:     General: Abdomen is flat.     Palpations: Abdomen is soft.  Musculoskeletal:        General: Normal range of motion.  Skin:    General: Skin is warm and dry.  Neurological:     General: No focal deficit present.     Mental Status: He is alert. Mental status is at baseline.  Psychiatric:        Attention and Perception: Attention normal.        Mood and Affect: Mood normal. Affect is blunt.        Speech: Speech normal.        Behavior: Behavior is cooperative.        Thought Content: Thought content normal.  Cognition and Memory: Cognition normal.        Judgment: Judgment is impulsive.    Review of Systems  Constitutional: Negative.   HENT: Negative.    Eyes: Negative.   Respiratory: Negative.    Cardiovascular: Negative.   Gastrointestinal: Negative.   Musculoskeletal: Negative.   Skin: Negative.   Neurological: Negative.   Psychiatric/Behavioral: Negative.     Blood pressure 109/68, pulse 74, temperature 98 F (36.7 C), temperature source Oral, resp. rate 17, height '6\' 5"'$  (1.956 m), weight 102.1 kg, SpO2  100 %. Body mass index is 26.68 kg/m.   Treatment Plan Summary: Medication management and Plan patient appears to be improving.  Reviewed cardiology note.  Cardiology had suggested discontinuing digoxin particularly if lithium was restarted so we will discontinue that.  We will follow-up vital signs and probably check another EKG.  Alethia Berthold, MD 11/29/2022, 12:40 PM

## 2022-11-29 NOTE — Group Note (Signed)
Recreation Therapy Group Note   Group Topic:Coping Skills  Group Date: 11/29/2022 Start Time: 1000 End Time: 1045 Facilitators: Vilma Prader, LRT, CTRS Location:  Craft Room  Group Description: Mind Map.  Patient was provided a blank template of a diagram with 32 blank boxes in a tiered system, branching from the center (similar to a bubble chart). LRT directed patients to label the middle of the diagram "Coping Skills". LRT and patients then came up with 8 different coping skills as examples. Pt were directed to record their coping skills in the 2nd tier boxes closest to the center.  Patients would then share their coping skills with the group as LRT wrote them out. LRT gave a handout of 100 different coping skills at the end of group.   Affect/Mood: Appropriate and Flat   Participation Level: Moderate   Participation Quality: Minimal Cues   Behavior: Appropriate and Calm   Speech/Thought Process: Coherent   Insight: Fair   Judgement: Fair    Modes of Intervention: Guided Discussion and Worksheet   Patient Response to Interventions:  Receptive   Education Outcome:  Acknowledges education   Clinical Observations/Individualized Feedback: Curtis Hodges was mostly active in their participation of session activities and group discussion. Pt identified "meditate, listen to music, social media, and volunteering" as coping skills. Pt would interact when prompted to, otherwise he kept to himself. Pt responded appropriately when prompted by LRT.    Plan: Continue to engage patient in RT group sessions 2-3x/week.   Vilma Prader, LRT, CTRS 11/29/2022 11:07 AM

## 2022-11-29 NOTE — Plan of Care (Signed)
D: Patient alert and oriented. Patient denies pain. Patient denies depression. Patient endorses 3/10 anxiety. Patient denies SI/HI/AVH. Patient has been isolative to room during shift with exception to coming out for meals and medication but did stay in the dayroom after dinner and interacted with staff before going back to room.  A: Scheduled medications administered to patient, per MD orders.  Support and encouragement provided to patient.  Q15 minute safety checks maintained.   R: Patient compliant with medication administration and treatment plan. No adverse drug reactions noted. Patient remains safe on the unit at this time. Problem: Education: Goal: Knowledge of General Education information will improve Description: Including pain rating scale, medication(s)/side effects and non-pharmacologic comfort measures Outcome: Progressing   Problem: Education: Goal: Knowledge of [place] [facility] Education information/materials will improve Outcome: Progressing Goal: Verbalization of understanding the information provided will improve Outcome: Progressing   Problem: Health Behavior/Discharge Planning: Goal: Compliance with treatment plan for underlying cause of condition will improve Outcome: Progressing   Problem: Physical Regulation: Goal: Ability to maintain clinical measurements within normal limits will improve Outcome: Progressing

## 2022-11-29 NOTE — Progress Notes (Signed)
   11/29/22 0000  Psych Admission Type (Psych Patients Only)  Admission Status Voluntary  Psychosocial Assessment  Patient Complaints Anxiety  Eye Contact Fair  Facial Expression Flat  Affect Appropriate to circumstance  Speech Logical/coherent  Interaction Assertive  Motor Activity Restless  Appearance/Hygiene Disheveled  Behavior Characteristics Appropriate to situation  Mood Preoccupied  Thought Process  Coherency Circumstantial  Content Preoccupation  Delusions None reported or observed  Perception WDL  Hallucination None reported or observed  Judgment Impaired  Confusion Mild  Danger to Self  Current suicidal ideation? Denies  Agreement Not to Harm Self Yes  Description of Agreement Verbal  Danger to Others  Danger to Others None reported or observed

## 2022-11-29 NOTE — [facility] Group Notes (Signed)
[place] Group Notes:  (Nursing/MHT/Case Management/Adjunct)  Date:  11/29/2022  Time:  9:37 AM  Type of Therapy:   community meeting  Participation Level:  Did Not Attend    Antonieta Pert 11/29/2022, 9:37 AM

## 2022-11-30 MED ORDER — NICOTINE 14 MG/24HR TD PT24: 14.0000 mg | MEDICATED_PATCH | Freq: Every day | TRANSDERMAL | Status: DC

## 2022-11-30 MED ORDER — TEMAZEPAM 15 MG PO CAPS
15.0000 mg | ORAL_CAPSULE | Freq: Every evening | ORAL | Status: DC | PRN
  Administered 2022-12-03: 15 mg via ORAL
  Filled 2022-11-30: qty 1

## 2022-11-30 NOTE — Progress Notes (Signed)
[facility] MD Progress Note  11/30/2022 9:24 AM JEFF GRUBE  MRN:  [id number] Subjective: Patient seen for follow-up.  52 year old man with schizoaffective disorder.  Patient was appropriate and lucid asking very appropriate relevant questions.  Denied any current psychotic symptoms.  Not showing any dangerous behavior.  Appears to be tolerating medicine well.  Reviewed with him that the digoxin had been discontinued at the recommendation of our cardiologist. Principal Problem: Schizoaffective disorder, bipolar type ([place]) Diagnosis: Principal Problem:   Schizoaffective disorder, bipolar type ([place]) Active Problems:   Hypothyroidism, unspecified   Unspecified atrial fibrillation ([place])   Personal history of pulmonary embolism   Bilateral edema of lower extremity   Chronic systolic heart failure ([place])   Bipolar 1 disorder ([place])  Total Time spent with patient: 30 minutes  Past Psychiatric History: Past history of what I believe is schizoaffective disorder  Past Medical History:  Past Medical History:  Diagnosis Date   Atrial fibrillation ([place])    Hypertension    Thyroid disease     Past Surgical History:  Procedure Laterality Date   CHOLECYSTECTOMY     GALLBLADDER SURGERY     Family History:  Family History  Problem Relation Age of Onset   COPD Mother    Atrial fibrillation Mother    Brain cancer Father    Family Psychiatric  History: See previous Social History:  Social History   Substance and Sexual Activity  Alcohol Use Not Currently     Social History   Substance and Sexual Activity  Drug Use Never    Social History   Socioeconomic History   Marital status: Single    Spouse name: Not on file   Number of children: Not on file   Years of education: Not on file   Highest education level: Not on file  Occupational History   Not on file  Tobacco Use   Smoking status: Never   Smokeless tobacco: Never  Vaping Use   Vaping Use: Never used  Substance and Sexual  Activity   Alcohol use: Not Currently   Drug use: Never   Sexual activity: Not Currently  Other Topics Concern   Not on file  Social History Narrative   Not on file   Social Determinants of Health   Financial Resource Strain: Low Risk  (07/23/2022)   Overall Financial Resource Strain (CARDIA)    Difficulty of Paying Living Expenses: Not hard at all  Food Insecurity: No Food Insecurity (11/15/2022)   Hunger Vital Sign    Worried About Running Out of Food in the Last Year: Never true    Ran Out of Food in the Last Year: Never true  Transportation Needs: No Transportation Needs (11/15/2022)   PRAPARE - Hydrologist (Medical): No    Lack of Transportation (Non-Medical): No  Physical Activity: Insufficiently Active (07/23/2022)   Exercise Vital Sign    Days of Exercise per Week: 5 days    Minutes of Exercise per Session: 10 min  Stress: No Stress Concern Present (07/23/2022)   [place]    Feeling of Stress : Only a little  Social Connections: Socially Isolated (07/23/2022)   Social Connection and Isolation Panel [NHANES]    Frequency of Communication with Friends and Family: Once a week    Frequency of Social Gatherings with Friends and Family: Once a week    Attends Religious Services: Never    Marine scientist or  Organizations: No    Attends Music therapist: 1 to 4 times per year    Marital Status: Never married   Additional Social History:                         Sleep: Fair  Appetite:  Fair  Current Medications: Current Facility-Administered Medications  Medication Dose Route Frequency Provider Last Rate Last Admin   acetaminophen (TYLENOL) tablet 650 mg  650 mg Oral Q6H PRN Caroline Sauger, NP   650 mg at 11/21/22 0813   alum & mag hydroxide-simeth (MAALOX/MYLANTA) 200-200-20 MG/5ML suspension 30 mL  30 mL Oral Q4H PRN Caroline Sauger, NP        haloperidol (HALDOL) tablet 5 mg  5 mg Oral Q6H PRN Caroline Sauger, NP       And   benztropine (COGENTIN) tablet 1 mg  1 mg Oral Q6H PRN Caroline Sauger, NP       busPIRone (BUSPAR) tablet 15 mg  15 mg Oral BID Sarahmarie Leavey T, MD   15 mg at 11/30/22 0831   diazepam (VALIUM) injection 5 mg  5 mg Intramuscular Q4H PRN Marjan Rosman, Madie Reno, MD       diphenhydrAMINE (BENADRYL) capsule 50 mg  50 mg Oral Q6H PRN Kerin Cecchi T, MD   50 mg at 11/21/22 [id number]   Or   diphenhydrAMINE (BENADRYL) injection 50 mg  50 mg Intramuscular Q6H PRN Velicia Dejager T, MD       haloperidol (HALDOL) tablet 5 mg  5 mg Oral Q6H PRN Prestina Raigoza T, MD   5 mg at 11/19/22 1952   Or   haloperidol lactate (HALDOL) injection 5 mg  5 mg Intramuscular Q6H PRN Kenrick Pore T, MD       levothyroxine (SYNTHROID) tablet 75 mcg  75 mcg Oral Q0600 Jana Swartzlander, Madie Reno, MD   75 mcg at 11/30/22 0828   lithium carbonate (ESKALITH) ER tablet 450 mg  450 mg Oral Q12H Jaida Basurto T, MD   450 mg at 11/30/22 0831   risperiDONE (RISPERDAL M-TABS) disintegrating tablet 2 mg  2 mg Oral Q8H PRN Caroline Sauger, NP   2 mg at 11/17/22 1146   And   LORazepam (ATIVAN) tablet 1 mg  1 mg Oral PRN Caroline Sauger, NP       LORazepam (ATIVAN) tablet 2 mg  2 mg Oral Q4H PRN Jayr Lupercio T, MD   2 mg at 11/26/22 2155   magnesium hydroxide (MILK OF MAGNESIA) suspension 30 mL  30 mL Oral Daily PRN Caroline Sauger, NP       metoprolol succinate (TOPROL-XL) 24 hr tablet 50 mg  50 mg Oral Daily Parks Ranger, DO   50 mg at 11/30/22 0828   midodrine (PROAMATINE) tablet 5 mg  5 mg Oral BID Jaliya Siegmann, Madie Reno, MD   5 mg at 11/30/22 [id number]   neomycin-bacitracin-polymyxin (NEOSPORIN) ointment   Topical Daily Parks Ranger, DO   Given at 11/30/22 [id number]   nicotine (NICODERM CQ - dosed in mg/24 hours) patch 14 mg  14 mg Transdermal Daily Mayci Haning, Madie Reno, MD       Oxcarbazepine (TRILEPTAL) tablet 600 mg  600 mg Oral BID Kwali Wrinkle,  Jerritt Cardoza T, MD   600 mg at 11/30/22 0830   QUEtiapine (SEROQUEL) tablet 400 mg  400 mg Oral QHS Aloysius Heinle T, MD   400 mg at 11/29/22 1955   ramelteon (ROZEREM) tablet 8 mg  8 mg  Oral QHS Tiffay Pinette, Madie Reno, MD   8 mg at 11/29/22 1955   risperiDONE (RISPERDAL M-TABS) disintegrating tablet 1.5 mg  1.5 mg Oral QHS Jaselyn Nahm T, MD   1.5 mg at 11/29/22 1955   risperiDONE (RISPERDAL M-TABS) disintegrating tablet 2 mg  2 mg Oral Q8H PRN Caroline Sauger, NP   2 mg at 11/22/22 2032   rivaroxaban (XARELTO) tablet 20 mg  20 mg Oral q morning Nazyia Gaugh T, MD   20 mg at 11/30/22 0834   temazepam (RESTORIL) capsule 15 mg  15 mg Oral QHS PRN Tehani Mersman, Madie Reno, MD        Lab Results: No results found for this or any previous visit (from the past 48 hour(s)).  Blood Alcohol level:  Lab Results  Component Value Date   ETH <10 123XX123    Metabolic Disorder Labs: Lab Results  Component Value Date   HGBA1C 5.8 (H) 11/17/2022   MPG 120 11/17/2022   MPG 117 07/23/2022   No results found for: "PROLACTIN" Lab Results  Component Value Date   CHOL 129 11/17/2022   TRIG 81 11/17/2022   HDL 65 11/17/2022   CHOLHDL 2.0 11/17/2022   VLDL 16 11/17/2022   LDLCALC 48 11/17/2022   LDLCALC 87 07/23/2022    Physical Findings: AIMS:  , ,  ,  ,    CIWA:    COWS:     Musculoskeletal: Strength & Muscle Tone: within normal limits Gait & Station: normal Patient leans: N/A  Psychiatric Specialty Exam:  Presentation  General Appearance:  Bizarre  Eye Contact: Minimal  Speech: Pressured; Clear and Coherent  Speech Volume: Increased  Handedness: Right   Mood and Affect  Mood: Angry; Irritable  Affect: Inappropriate; Full Range   Thought Process  Thought Processes: Coherent  Descriptions of Associations:Circumstantial  Orientation:Full (Time, Place and Person)  Thought Content:Logical; Obsessions  History of Schizophrenia/Schizoaffective disorder:No  Duration of  Psychotic Symptoms:Less than six months  Hallucinations:No data recorded Ideas of Reference:None  Suicidal Thoughts:No data recorded Homicidal Thoughts:No data recorded  Sensorium  Memory: Immediate Good; Recent Good; Remote Good  Judgment: Poor  Insight: Fair   Community education officer  Concentration: Poor  Attention Span: Poor  Recall: Good  Fund of Knowledge: Good  Language: Good   Psychomotor Activity  Psychomotor Activity:No data recorded  Assets  Assets: Communication Skills; Desire for Improvement; Social Support; Resilience; Physical Health   Sleep  Sleep:No data recorded   Physical Exam: Physical Exam Vitals and nursing note reviewed.  Constitutional:      Appearance: Normal appearance.  HENT:     Head: Normocephalic and atraumatic.     Mouth/Throat:     Pharynx: Oropharynx is clear.  Eyes:     Pupils: Pupils are equal, round, and reactive to light.  Cardiovascular:     Rate and Rhythm: Normal rate and regular rhythm.  Pulmonary:     Effort: Pulmonary effort is normal.     Breath sounds: Normal breath sounds.  Abdominal:     General: Abdomen is flat.     Palpations: Abdomen is soft.  Musculoskeletal:        General: Normal range of motion.  Skin:    General: Skin is warm and dry.  Neurological:     General: No focal deficit present.     Mental Status: He is alert. Mental status is at baseline.  Psychiatric:        Attention and Perception: Attention normal.  Mood and Affect: Mood normal. Affect is blunt.        Speech: Speech normal.        Behavior: Behavior normal.        Thought Content: Thought content normal.        Cognition and Memory: Cognition normal.    Review of Systems  Constitutional: Negative.   HENT: Negative.    Eyes: Negative.   Respiratory: Negative.    Cardiovascular: Negative.   Gastrointestinal: Negative.   Musculoskeletal: Negative.   Skin: Negative.   Neurological: Negative.    Psychiatric/Behavioral: Negative.     Blood pressure 125/83, pulse 84, temperature 97.6 F (36.4 C), temperature source Oral, resp. rate 16, height 6\' 5"  (1.956 m), weight 102.1 kg, SpO2 100 %. Body mass index is 26.68 kg/m.   Treatment Plan Summary: Medication management and Plan he did complain that he is not sleeping very well still and wakes up at night.  I have added a as needed of temazepam and told him that if he needs it he can ask for it.  No other change to medicine.  Anticipate probably checking lithium level again on Sunday and maybe an EKG at that point.  Told patient that if things are going well I would anticipate discharge possibly as early as Monday.  Alethia Berthold, MD 11/30/2022, 9:24 AM

## 2022-11-30 NOTE — Group Note (Signed)
LCSW Group Therapy Note  Group Date: 11/29/2022 Start Time: 1300 End Time: 1400   Type of Therapy and Topic:  Group Therapy - How To Cope with Nervousness about Discharge   Participation Level:  Did Not Attend   Description of Group This process group involved identification of patients' feelings about discharge. Some of them are scheduled to be discharged soon, while others are new admissions, but each of them was asked to share thoughts and feelings surrounding discharge from the hospital. One common theme was that they are excited at the prospect of going home, while another was that many of them are apprehensive about sharing why they were hospitalized. Patients were given the opportunity to discuss these feelings with their peers in preparation for discharge.  Therapeutic Goals  Patient will identify their overall feelings about pending discharge. Patient will think about how they might proactively address issues that they believe will once again arise once they get home (i.e. with parents). Patients will participate in discussion about having hope for change.   Summary of Patient Progress:   Patient did not attend group despite encouraged participation.   Therapeutic Modalities Cognitive Behavioral Therapy   Larose Kells 11/30/2022  2:31 PM

## 2022-11-30 NOTE — Group Note (Signed)
Recreation Therapy Group Note   Group Topic:Emotion Expression  Group Date: 11/30/2022 Start Time: 1000 End Time: 1030 Facilitators: Vilma Prader, LRT, CTRS Location:  Craft Room  Group Description: Gratitude Journaling. Patients and LRT discussed what gratitude means, how we can express it and what it means to [place], personally. LRT gave an education handout on the definition of gratitude that also gave different examples of gratitude exercises that they could try. One of the examples was "Gratitude Letter", which prompted you to write a letter to someone you appreciate. LRT played soft music while everyone wrote their letter. Once letter was completed, LRT encouraged people to read their letter, if they wanted to, or share who they wrote it to, at minimum. LRT and pts processed how showing gratitude towards themselves, and others can be applied to everyday life post-discharge.   Affect/Mood: Appropriate and Flat   Participation Level: Active and Engaged   Participation Quality: Independent   Behavior: Appropriate and Drowsy   Speech/Thought Process: [place]   Insight: Good   Judgement: Good   Modes of Intervention: Activity and Guided Discussion   Patient Response to Interventions:  Engaged, Interested , and Receptive   Education Outcome:  Acknowledges education   Clinical Observations/Individualized Feedback: Curtis Hodges was active in their participation of session activities and group discussion. Pt identified "I wrote my letter to my mom and told her thank you for being there for the good and bad". Pt was noted to be falling asleep during discussion and once he was done writing his letter. Pt interacted appropriately with peers and LRT duration of session.    Plan: Continue to engage patient in RT group sessions 2-3x/week.   Vilma Prader, LRT, CTRS 11/30/2022 10:47 AM

## 2022-11-30 NOTE — Plan of Care (Signed)

## 2022-11-30 NOTE — Progress Notes (Signed)
Patient noted in dayroom watching television. No interaction with peers, minimal interaction with peers. No complaints voiced. Denies SI, HI, AVH. Medications given as prescribed. Pt receptive and remains safe on unit with q 15 min checks.

## 2022-12-01 NOTE — Progress Notes (Signed)
[facility] MD Progress Note  12/01/2022 1:08 PM Curtis Hodges  MRN:  [id number] Subjective: Follow-up patient with diagnosis of schizoaffective disorder.  Patient had no new complaints.  Wanted to review his vital signs and lab studies which we did.  Reviewed some questions about medication.  Overall very appropriate in his interaction.  States his mood is better.  Denies hallucinations.  Able to stay lucid and on topic throughout the conversation. Principal Problem: Schizoaffective disorder, bipolar type ([place]) Diagnosis: Principal Problem:   Schizoaffective disorder, bipolar type ([place]) Active Problems:   Hypothyroidism, unspecified   Unspecified atrial fibrillation ([place])   Personal history of pulmonary embolism   Bilateral edema of lower extremity   Chronic systolic heart failure ([facility])   Bipolar 1 disorder ([facility])  Total Time spent with patient: 30 minutes  Past Psychiatric History: Past history of chronic mental illness of various diagnoses although to me it seems that the history by far is most likely to fit the pattern of schizoaffective disorder.  Past Medical History:  Past Medical History:  Diagnosis Date   Atrial fibrillation ([place])    Hypertension    Thyroid disease     Past Surgical History:  Procedure Laterality Date   CHOLECYSTECTOMY     GALLBLADDER SURGERY     Family History:  Family History  Problem Relation Age of Onset   COPD Mother    Atrial fibrillation Mother    Brain cancer Father    Family Psychiatric  History: Appears to be doing much better.  Possible discharge as early as Monday Social History:  Social History   Substance and Sexual Activity  Alcohol Use Not Currently     Social History   Substance and Sexual Activity  Drug Use Never    Social History   Socioeconomic History   Marital status: Single    Spouse name: Not on file   Number of children: Not on file   Years of education: Not on file   Highest education level: Not on file   Occupational History   Not on file  Tobacco Use   Smoking status: Never   Smokeless tobacco: Never  Vaping Use   Vaping Use: Never used  Substance and Sexual Activity   Alcohol use: Not Currently   Drug use: Never   Sexual activity: Not Currently  Other Topics Concern   Not on file  Social History Narrative   Not on file   Social Determinants of Health   Financial Resource Strain: Low Risk  (07/23/2022)   Overall Financial Resource Strain (CARDIA)    Difficulty of Paying Living Expenses: Not hard at all  Food Insecurity: No Food Insecurity (11/15/2022)   Hunger Vital Sign    Worried About Running Out of Food in the Last Year: Never true    [place] in the Last Year: Never true  Transportation Needs: No Transportation Needs (11/15/2022)   PRAPARE - Hydrologist (Medical): No    Lack of Transportation (Non-Medical): No  Physical Activity: Insufficiently Active (07/23/2022)   Exercise Vital Sign    Days of Exercise per Week: 5 days    Minutes of Exercise per Session: 10 min  Stress: No Stress Concern Present (07/23/2022)   [place]    Feeling of Stress : Only a little  Social Connections: Socially Isolated (07/23/2022)   Social Connection and Isolation Panel [NHANES]    Frequency of Communication with Friends  and Family: Once a week    Frequency of Social Gatherings with Friends and Family: Once a week    Attends Religious Services: Never    Marine scientist or Organizations: No    Attends Music therapist: 1 to 4 times per year    Marital Status: Never married   Additional Social History:                         Sleep: Fair  Appetite:  Fair  Current Medications: Current Facility-Administered Medications  Medication Dose Route Frequency Provider Last Rate Last Admin   acetaminophen (TYLENOL) tablet 650 mg  650 mg Oral Q6H PRN Caroline Sauger, NP   650 mg at 11/21/22 0813   alum & mag hydroxide-simeth (MAALOX/MYLANTA) 200-200-20 MG/5ML suspension 30 mL  30 mL Oral Q4H PRN Caroline Sauger, NP       haloperidol (HALDOL) tablet 5 mg  5 mg Oral Q6H PRN Caroline Sauger, NP       And   benztropine (COGENTIN) tablet 1 mg  1 mg Oral Q6H PRN Caroline Sauger, NP       busPIRone (BUSPAR) tablet 15 mg  15 mg Oral BID Aayan Haskew T, MD   15 mg at 12/01/22 [id number]   diazepam (VALIUM) injection 5 mg  5 mg Intramuscular Q4H PRN Meredyth Hornung T, MD       diphenhydrAMINE (BENADRYL) capsule 50 mg  50 mg Oral Q6H PRN Dalayna Lauter T, MD   50 mg at 11/21/22 [id number]   Or   diphenhydrAMINE (BENADRYL) injection 50 mg  50 mg Intramuscular Q6H PRN Merian Wroe T, MD       haloperidol (HALDOL) tablet 5 mg  5 mg Oral Q6H PRN Heavin Sebree T, MD   5 mg at 11/19/22 1952   Or   haloperidol lactate (HALDOL) injection 5 mg  5 mg Intramuscular Q6H PRN Lataysha Vohra T, MD       levothyroxine (SYNTHROID) tablet 75 mcg  75 mcg Oral Q0600 Dally Oshel, Madie Reno, MD   75 mcg at 12/01/22 [id number]   lithium carbonate (ESKALITH) ER tablet 450 mg  450 mg Oral Q12H Kimbely Whiteaker T, MD   450 mg at 12/01/22 [id number]   risperiDONE (RISPERDAL M-TABS) disintegrating tablet 2 mg  2 mg Oral Q8H PRN Caroline Sauger, NP   2 mg at 11/17/22 1146   And   LORazepam (ATIVAN) tablet 1 mg  1 mg Oral PRN Caroline Sauger, NP       LORazepam (ATIVAN) tablet 2 mg  2 mg Oral Q4H PRN Amy Belloso T, MD   2 mg at 11/26/22 2155   magnesium hydroxide (MILK OF MAGNESIA) suspension 30 mL  30 mL Oral Daily PRN Caroline Sauger, NP       metoprolol succinate (TOPROL-XL) 24 hr tablet 50 mg  50 mg Oral Daily Parks Ranger, DO   50 mg at 12/01/22 [id number]   midodrine (PROAMATINE) tablet 5 mg  5 mg Oral BID Ailin Rochford, Madie Reno, MD   5 mg at 12/01/22 [id number]   neomycin-bacitracin-polymyxin (NEOSPORIN) ointment   Topical Daily Parks Ranger, DO   Given at 11/30/22 0912    Oxcarbazepine (TRILEPTAL) tablet 600 mg  600 mg Oral BID Ryka Beighley, Madie Reno, MD   600 mg at 12/01/22 [id number]   QUEtiapine (SEROQUEL) tablet 400 mg  400 mg Oral QHS Steffon Gladu, Madie Reno, MD   400 mg at 11/30/22 1955  ramelteon (ROZEREM) tablet 8 mg  8 mg Oral QHS Addaline Peplinski T, MD   8 mg at 11/30/22 1958   risperiDONE (RISPERDAL M-TABS) disintegrating tablet 1.5 mg  1.5 mg Oral QHS Odalis Jordan T, MD   1.5 mg at 11/30/22 1955   risperiDONE (RISPERDAL M-TABS) disintegrating tablet 2 mg  2 mg Oral Q8H PRN Caroline Sauger, NP   2 mg at 11/22/22 2032   rivaroxaban (XARELTO) tablet 20 mg  20 mg Oral q morning Norwin Aleman T, MD   20 mg at 12/01/22 0823   temazepam (RESTORIL) capsule 15 mg  15 mg Oral QHS PRN Jasslyn Finkel, Madie Reno, MD        Lab Results: No results found for this or any previous visit (from the past 48 hour(s)).  Blood Alcohol level:  Lab Results  Component Value Date   ETH <10 123XX123    Metabolic Disorder Labs: Lab Results  Component Value Date   HGBA1C 5.8 (H) 11/17/2022   MPG 120 11/17/2022   MPG 117 07/23/2022   No results found for: "PROLACTIN" Lab Results  Component Value Date   CHOL 129 11/17/2022   TRIG 81 11/17/2022   HDL 65 11/17/2022   CHOLHDL 2.0 11/17/2022   VLDL 16 11/17/2022   LDLCALC 48 11/17/2022   LDLCALC 87 07/23/2022    Physical Findings: AIMS:  , ,  ,  ,    CIWA:    COWS:     Musculoskeletal: Strength & Muscle Tone: within normal limits Gait & Station: normal Patient leans: N/A  Psychiatric Specialty Exam:  Presentation  General Appearance:  Bizarre  Eye Contact: Minimal  Speech: Pressured; Clear and Coherent  Speech Volume: Increased  Handedness: Right   Mood and Affect  Mood: Angry; Irritable  Affect: Inappropriate; Full Range   Thought Process  Thought Processes: Coherent  Descriptions of Associations:Circumstantial  Orientation:Full (Time, Place and Person)  Thought Content:Logical; Obsessions  History  of Schizophrenia/Schizoaffective disorder:No  Duration of Psychotic Symptoms:Less than six months  Hallucinations:No data recorded Ideas of Reference:None  Suicidal Thoughts:No data recorded Homicidal Thoughts:No data recorded  Sensorium  Memory: Immediate Good; Recent Good; Remote Good  Judgment: Poor  Insight: Fair   Community education officer  Concentration: Poor  Attention Span: Poor  Recall: Good  Fund of Knowledge: Good  Language: Good   Psychomotor Activity  Psychomotor Activity:No data recorded  Assets  Assets: Communication Skills; Desire for Improvement; Social Support; Resilience; Physical Health   Sleep  Sleep:No data recorded   Physical Exam: Physical Exam Vitals and nursing note reviewed.  Constitutional:      Appearance: Normal appearance.  HENT:     Head: Normocephalic and atraumatic.     Mouth/Throat:     Pharynx: Oropharynx is clear.  Eyes:     Pupils: Pupils are equal, round, and reactive to light.  Cardiovascular:     Rate and Rhythm: Normal rate and regular rhythm.  Pulmonary:     Effort: Pulmonary effort is normal.     Breath sounds: Normal breath sounds.  Abdominal:     General: Abdomen is flat.     Palpations: Abdomen is soft.  Musculoskeletal:        General: Normal range of motion.  Skin:    General: Skin is warm and dry.  Neurological:     General: No focal deficit present.     Mental Status: He is alert. Mental status is at baseline.  Psychiatric:        Attention  and Perception: Attention normal.        Mood and Affect: Mood normal. Affect is blunt.        Speech: Speech normal.        Behavior: Behavior is cooperative.        Thought Content: Thought content normal.        Cognition and Memory: Cognition normal.    Review of Systems  Constitutional: Negative.   HENT: Negative.    Eyes: Negative.   Respiratory: Negative.    Cardiovascular: Negative.   Gastrointestinal: Negative.   Musculoskeletal:  Negative.   Skin: Negative.   Neurological: Negative.   Psychiatric/Behavioral: Negative.     Blood pressure (!) 107/51, pulse 97, temperature 98 F (36.7 C), temperature source Oral, resp. rate 17, height 6\' 5"  (1.956 m), weight 102.1 kg, SpO2 100 %. Body mass index is 26.68 kg/m.   Treatment Plan Summary: Plan see note above.  Doing much better possible discharge as early as Monday.  No cardiac complaints.  Alethia Berthold, MD 12/01/2022, 1:08 PM

## 2022-12-01 NOTE — Progress Notes (Signed)
Patient was cooperative with treatment, medication compliant, he had no new behavioral issues to report on shift at this time.

## 2022-12-01 NOTE — Plan of Care (Signed)
D: Patient alert and oriented. Patient denies pain. Patient denies depression. Patient endorses anxiety. Patient denies SI/HI/AVH. Patient has been observed around the unit watching television and coloring in the dayroom during shift.   A: Scheduled medications administered to patient, per MD orders.  Support and encouragement provided to patient.  Q15 minute safety checks maintained.   R: Patient compliant with medication administration and treatment plan. No adverse drug reactions noted. Patient remains safe on the unit at this time. Problem: Education: Goal: Knowledge of  General Education information/materials will improve Outcome: Progressing Goal: Verbalization of understanding the information provided will improve Outcome: Progressing   Problem: Health Behavior/Discharge Planning: Goal: Compliance with treatment plan for underlying cause of condition will improve Outcome: Progressing   Problem: Physical Regulation: Goal: Ability to maintain clinical measurements within normal limits will improve Outcome: Progressing

## 2022-12-01 NOTE — Group Note (Signed)
[place] LCSW Group Therapy Note   Group Date: 12/01/2022 Start Time: [id number] End Time: 1411   Type of Therapy/Topic:  Group Therapy:  Emotion Regulation  Participation Level:  Minimal   Mood:  Description of Group:    The purpose of this group is to assist patients in learning to regulate negative emotions and experience positive emotions. Patients will be guided to discuss ways in which they have been vulnerable to their negative emotions. These vulnerabilities will be juxtaposed with experiences of positive emotions or situations, and patients challenged to use positive emotions to combat negative ones. Special emphasis will be placed on coping with negative emotions in conflict situations, and patients will process healthy conflict resolution skills.  Therapeutic Goals: Patient will identify two positive emotions or experiences to reflect on in order to balance out negative emotions:  Patient will label two or more emotions that they find the most difficult to experience:  Patient will be able to demonstrate positive conflict resolution skills through discussion or role plays:   Summary of Patient Progress: Patient was present in group.  Patient was an active participant.  Patient did not contribute much verbally, however, appeared alert and supportive of other group members.    Therapeutic Modalities:   Cognitive Behavioral Therapy Feelings Identification Dialectical Behavioral Therapy   Rozann Lesches, LCSW

## 2022-12-01 NOTE — BH IP Treatment Plan (Signed)
Interdisciplinary Treatment and Diagnostic Plan Update  12/01/2022 Time of Session: 8:30AM Curtis Hodges MRN: [id number]  Principal Diagnosis: Schizoaffective disorder, bipolar type [facility])  Secondary Diagnoses: Principal Problem:   Schizoaffective disorder, bipolar type ([place]) Active Problems:   Hypothyroidism, unspecified   Unspecified atrial fibrillation ([place])   Personal history of pulmonary embolism   Bilateral edema of lower extremity   Chronic systolic heart failure ([place])   Bipolar 1 disorder ([place])   Current Medications:  Current Facility-Administered Medications  Medication Dose Route Frequency Provider Last Rate Last Admin   acetaminophen (TYLENOL) tablet 650 mg  650 mg Oral Q6H PRN Caroline Sauger, NP   650 mg at 11/21/22 0813   alum & mag hydroxide-simeth (MAALOX/MYLANTA) 200-200-20 MG/5ML suspension 30 mL  30 mL Oral Q4H PRN Caroline Sauger, NP       haloperidol (HALDOL) tablet 5 mg  5 mg Oral Q6H PRN Caroline Sauger, NP       And   benztropine (COGENTIN) tablet 1 mg  1 mg Oral Q6H PRN Caroline Sauger, NP       busPIRone (BUSPAR) tablet 15 mg  15 mg Oral BID Clapacs, John T, MD   15 mg at 12/01/22 [id number]   diazepam (VALIUM) injection 5 mg  5 mg Intramuscular Q4H PRN Clapacs, Madie Reno, MD       diphenhydrAMINE (BENADRYL) capsule 50 mg  50 mg Oral Q6H PRN Clapacs, John T, MD   50 mg at 11/21/22 [id number]   Or   diphenhydrAMINE (BENADRYL) injection 50 mg  50 mg Intramuscular Q6H PRN Clapacs, John T, MD       haloperidol (HALDOL) tablet 5 mg  5 mg Oral Q6H PRN Clapacs, John T, MD   5 mg at 11/19/22 1952   Or   haloperidol lactate (HALDOL) injection 5 mg  5 mg Intramuscular Q6H PRN Clapacs, John T, MD       levothyroxine (SYNTHROID) tablet 75 mcg  75 mcg Oral Q0600 Clapacs, Madie Reno, MD   75 mcg at 12/01/22 [id number]   lithium carbonate (ESKALITH) ER tablet 450 mg  450 mg Oral Q12H Clapacs, John T, MD   450 mg at 12/01/22 [id number]   risperiDONE (RISPERDAL M-TABS)  disintegrating tablet 2 mg  2 mg Oral Q8H PRN Caroline Sauger, NP   2 mg at 11/17/22 1146   And   LORazepam (ATIVAN) tablet 1 mg  1 mg Oral PRN Caroline Sauger, NP       LORazepam (ATIVAN) tablet 2 mg  2 mg Oral Q4H PRN Clapacs, John T, MD   2 mg at 11/26/22 2155   magnesium hydroxide (MILK OF MAGNESIA) suspension 30 mL  30 mL Oral Daily PRN Caroline Sauger, NP       metoprolol succinate (TOPROL-XL) 24 hr tablet 50 mg  50 mg Oral Daily Parks Ranger, DO   50 mg at 12/01/22 [id number]   midodrine (PROAMATINE) tablet 5 mg  5 mg Oral BID Clapacs, Madie Reno, MD   5 mg at 12/01/22 [id number]   neomycin-bacitracin-polymyxin (NEOSPORIN) ointment   Topical Daily Parks Ranger, DO   Given at 11/30/22 [id number]   Oxcarbazepine (TRILEPTAL) tablet 600 mg  600 mg Oral BID Clapacs, Madie Reno, MD   600 mg at 12/01/22 [id number]   QUEtiapine (SEROQUEL) tablet 400 mg  400 mg Oral QHS Clapacs, Madie Reno, MD   400 mg at 11/30/22 1955   ramelteon (ROZEREM) tablet 8 mg  8 mg Oral QHS Clapacs, Madie Reno, MD  8 mg at 11/30/22 1958   risperiDONE (RISPERDAL M-TABS) disintegrating tablet 1.5 mg  1.5 mg Oral QHS Clapacs, John T, MD   1.5 mg at 11/30/22 1955   risperiDONE (RISPERDAL M-TABS) disintegrating tablet 2 mg  2 mg Oral Q8H PRN Caroline Sauger, NP   2 mg at 11/22/22 2032   rivaroxaban (XARELTO) tablet 20 mg  20 mg Oral q morning Clapacs, Madie Reno, MD   20 mg at 12/01/22 [id number]   temazepam (RESTORIL) capsule 15 mg  15 mg Oral QHS PRN Clapacs, Madie Reno, MD       PTA Medications: Medications Prior to Admission  Medication Sig Dispense Refill Last Dose   busPIRone (BUSPAR) 15 MG tablet Take 30 mg by mouth 2 (two) times daily.      digoxin (LANOXIN) 0.25 MG tablet Take 250 mcg by mouth daily.      levothyroxine (SYNTHROID) 75 MCG tablet Take 1 tablet (75 mcg total) by mouth daily. 90 tablet 3    metoprolol succinate (TOPROL-XL) 50 MG 24 hr tablet Take 50 mg by mouth at bedtime.      midodrine (PROAMATINE) 5 MG tablet  Take 5 mg by mouth 2 (two) times daily with a meal.      mirtazapine (REMERON) 30 MG tablet Take 30 mg by mouth at bedtime.      mupirocin ointment (BACTROBAN) 2 % Apply 1 Application topically 2 (two) times daily. 22 g 0    oxcarbazepine (TRILEPTAL) 600 MG tablet Take 600 mg by mouth 2 (two) times daily.      ramelteon (ROZEREM) 8 MG tablet Take 8 mg by mouth at bedtime.      risperiDONE (RISPERDAL M-TABS) 0.5 MG disintegrating tablet Take 0.5 mg by mouth daily.      XARELTO 20 MG TABS tablet Take 20 mg by mouth every morning.       Patient Stressors: Medication change or noncompliance   Traumatic event    Patient Strengths: Motivation for treatment/growth  Supportive family/friends   Treatment Modalities: Medication Management, Group therapy, Case management,  1 to 1 session with clinician, Psychoeducation, Recreational therapy.   Physician Treatment Plan for Primary Diagnosis: Schizoaffective disorder, bipolar type ([place]) Long Term Goal(s): Improvement in symptoms so as ready for discharge   Short Term Goals: Ability to maintain clinical measurements within normal limits will improve Compliance with prescribed medications will improve Ability to verbalize feelings will improve Ability to demonstrate self-control will improve Ability to identify and develop effective coping behaviors will improve  Medication Management: Evaluate patient's response, side effects, and tolerance of medication regimen.  Therapeutic Interventions: 1 to 1 sessions, Unit Group sessions and Medication administration.  Evaluation of Outcomes: Progressing  Physician Treatment Plan for Secondary Diagnosis: Principal Problem:   Schizoaffective disorder, bipolar type ([place]) Active Problems:   Hypothyroidism, unspecified   Unspecified atrial fibrillation ([place])   Personal history of pulmonary embolism   Bilateral edema of lower extremity   Chronic systolic heart failure ([place])   Bipolar 1 disorder  ([facility])  Long Term Goal(s): Improvement in symptoms so as ready for discharge   Short Term Goals: Ability to maintain clinical measurements within normal limits will improve Compliance with prescribed medications will improve Ability to verbalize feelings will improve Ability to demonstrate self-control will improve Ability to identify and develop effective coping behaviors will improve     Medication Management: Evaluate patient's response, side effects, and tolerance of medication regimen.  Therapeutic Interventions: 1 to 1 sessions, Unit Group sessions and  Medication administration.  Evaluation of Outcomes: Progressing   RN Treatment Plan for Primary Diagnosis: Schizoaffective disorder, bipolar type ([place]) Long Term Goal(s): Knowledge of disease and therapeutic regimen to maintain health will improve  Short Term Goals: Ability to demonstrate self-control, Ability to participate in decision making will improve, Ability to verbalize feelings will improve, Ability to disclose and discuss suicidal ideas, Ability to identify and develop effective coping behaviors will improve, and Compliance with prescribed medications will improve  Medication Management: RN will administer medications as ordered by provider, will assess and evaluate patient's response and provide education to patient for prescribed medication. RN will report any adverse and/or side effects to prescribing provider.  Therapeutic Interventions: 1 on 1 counseling sessions, Psychoeducation, Medication administration, Evaluate responses to treatment, Monitor vital signs and CBGs as ordered, Perform/monitor CIWA, COWS, AIMS and Fall Risk screenings as ordered, Perform wound care treatments as ordered.  Evaluation of Outcomes: Progressing   LCSW Treatment Plan for Primary Diagnosis: Schizoaffective disorder, bipolar type ([place]) Long Term Goal(s): Safe transition to appropriate next level of care at discharge, Engage patient in  therapeutic group addressing interpersonal concerns.  Short Term Goals: Engage patient in aftercare planning with referrals and resources, Increase social support, Increase ability to appropriately verbalize feelings, Increase emotional regulation, Facilitate acceptance of mental health diagnosis and concerns, and Increase skills for wellness and recovery  Therapeutic Interventions: Assess for all discharge needs, 1 to 1 time with Social worker, Explore available resources and support systems, Assess for adequacy in community support network, Educate family and significant other(s) on suicide prevention, Complete Psychosocial Assessment, Interpersonal group therapy.  Evaluation of Outcomes: Progressing   Progress in Treatment: Attending groups: Yes. Participating in groups: Yes. Taking medication as prescribed: Yes. Toleration medication: Yes. Family/Significant other contact made: Yes, individual(s) contacted:  SPE completed with the patient's mother and sister.  Patient understands diagnosis: Yes. Discussing patient identified problems/goals with staff: Yes. Medical problems stabilized or resolved: Yes. Denies suicidal/homicidal ideation: Yes. Issues/concerns per patient self-inventory: No. Other: none  New problem(s) identified: No, Describe:  none  Update 11/26/2022: No changes at this time. Update 12/01/2022:  No changes at this time.    New Short Term/Long Term Goal(s): Patient to work towards elimination of symptoms of psychosis, medication management for mood stabilization; development of comprehensive mental wellness plan.  Update 11/26/2022: No changes at this time. Update 12/01/2022:  No changes at this time.    Patient Goals: No additional goals identified at this time. Patient to continue to work towards original goals identified in initial treatment team meeting. CSW will remain available to patient should they voice additional treatment goals. Update 11/26/2022: No changes at this  time. Update 12/01/2022:  No changes at this time.    Discharge Plan or Barriers: No psychosocial barriers identified at this time, patient to return to place of residence when appropriate for discharge. Update 11/26/2022: Patient remains somewhat disorganized at this time.  Patient plans to return to his home.  Pt reports that he would like to continue with current provider.  Update 12/01/2022:  Patient continues to improve.  He reports plans to return home.  Patient likely to continue with current mental health provider.    Reason for Continuation of Hospitalization: Medication stabilization Other; describe psychosis    Estimated Length of Stay: 1-7 days  Updated 11/26/2022: No changes at this time. Update 12/01/2022:  TBD    Last 3 [place] Suicide Severity Risk Score: Flowsheet Row Admission (Current) from 11/15/2022 in [place]  [facility] ED from 11/14/2022 in [facility] at [facility] ED from 08/19/2022 in [facility] [place] No Risk No Risk No Risk       Last PHQ 2/9 Scores:    08/22/2022   11:06 AM 07/23/2022    9:46 AM 09/20/2021   10:43 AM  Depression screen PHQ 2/9  Decreased Interest 0 0 0  Down, Depressed, Hopeless 0 0 0  PHQ - 2 Score 0 0 0  Altered sleeping 0 0 0  Tired, decreased energy 0 0 0  Change in appetite 0 0 0  Feeling bad or failure about yourself  0 0 0  Trouble concentrating 0 0 0  Moving slowly or fidgety/restless 0 0 0  Suicidal thoughts 0 0 0  PHQ-9 Score 0 0 0  Difficult doing work/chores Not difficult at all Not difficult at all Not difficult at all    Scribe for Treatment Team: Rozann Lesches, LCSW 12/01/2022 11:33 AM

## 2022-12-01 NOTE — Group Note (Signed)
AA/NA Group     Group Date: 12/01/2022 Start Time: [id number] End Time: 1445     Type of Therapy and Topic:  Group Therapy:    Participation Level:  Did Attend   Description of Group: AA/NA providers held group with patients to address SUD.   Summary of Patient Progress:   Patient was present for AA/NA group.   Therapeutic Modalities:    Maryjane Hurter 12/01/2022  2:53 PM

## 2022-12-02 LAB — LITHIUM LEVEL: Lithium Lvl: 0.8 mmol/L (ref 0.60–1.20)

## 2022-12-02 NOTE — Progress Notes (Signed)
D- Patient alert and oriented x4. Affect flat/mood preoccupied . Denies SI/ HI/ AVH. He denies pain.He denies depression, hopelessness and anxiety. His goals today are to do laundry, therapies and "stay on task". A- Scheduled medications administered to patient, per MD orders. Support and encouragement provided.  Routine safety checks conducted every 15 minutes.  Patient informed to notify staff with problems or concerns and verbalizes understanding. R- No adverse drug reactions noted. Patient compliant with medications and treatment plan. Patient receptive, calm, and cooperative. He spent the shift rotating form his room to the dayroom and attended outside activity. He contracts for safety and  remains safe on the unit at this time.

## 2022-12-02 NOTE — Progress Notes (Signed)
[facility] MD Progress Note  12/02/2022 1:23 PM Curtis Hodges  MRN:  [id number] Subjective: Patient seen and chart reviewed.  Patient has no new complaints.  Mood is much improved.  Lucid thinking.  No evidence hallucinations no violence or dangerous behavior.  Lithium level came back at 0.8 Principal Problem: Schizoaffective disorder, bipolar type (HCC) Diagnosis: Principal Problem:   Schizoaffective disorder, bipolar type ([place]) Active Problems:   Hypothyroidism, unspecified   Unspecified atrial fibrillation (HCC)   Personal history of pulmonary embolism   Bilateral edema of lower extremity   Chronic systolic heart failure (HCC)   Bipolar 1 disorder (HCC)  Total Time spent with patient: 30 minutes  Past Psychiatric History: Patient has a history of schizoaffective disorder  Past Medical History:  Past Medical History:  Diagnosis Date   Atrial fibrillation ([place])    Hypertension    Thyroid disease     Past Surgical History:  Procedure Laterality Date   CHOLECYSTECTOMY     GALLBLADDER SURGERY     Family History:  Family History  Problem Relation Age of Onset   COPD Mother    Atrial fibrillation Mother    Brain cancer Father    Family Psychiatric  History: See previous Social History:  Social History   Substance and Sexual Activity  Alcohol Use Not Currently     Social History   Substance and Sexual Activity  Drug Use Never    Social History   Socioeconomic History   Marital status: Single    Spouse name: Not on file   Number of children: Not on file   Years of education: Not on file   Highest education level: Not on file  Occupational History   Not on file  Tobacco Use   Smoking status: Never   Smokeless tobacco: Never  Vaping Use   Vaping Use: Never used  Substance and Sexual Activity   Alcohol use: Not Currently   Drug use: Never   Sexual activity: Not Currently  Other Topics Concern   Not on file  Social History Narrative   Not on file   Social  Determinants of Health   Financial Resource Strain: Low Risk  (07/23/2022)   Overall Financial Resource Strain (CARDIA)    Difficulty of Paying Living Expenses: Not hard at all  Food Insecurity: No Food Insecurity (11/15/2022)   Hunger Vital Sign    Worried About Running Out of Food in the Last Year: Never true    Ran Out of Food in the Last Year: Never true  Transportation Needs: No Transportation Needs (11/15/2022)   PRAPARE - Hydrologist (Medical): No    Lack of Transportation (Non-Medical): No  Physical Activity: Insufficiently Active (07/23/2022)   Exercise Vital Sign    Days of Exercise per Week: 5 days    Minutes of Exercise per Session: 10 min  Stress: No Stress Concern Present (07/23/2022)   [place]    Feeling of Stress : Only a little  Social Connections: Socially Isolated (07/23/2022)   Social Connection and Isolation Panel [NHANES]    Frequency of Communication with Friends and Family: Once a week    Frequency of Social Gatherings with Friends and Family: Once a week    Attends Religious Services: Never    Marine scientist or Organizations: No    Attends Music therapist: 1 to 4 times per year    Marital Status: Never married  Additional Social History:                         Sleep: Fair  Appetite:  Fair  Current Medications: Current Facility-Administered Medications  Medication Dose Route Frequency Provider Last Rate Last Admin   acetaminophen (TYLENOL) tablet 650 mg  650 mg Oral Q6H PRN Caroline Sauger, NP   650 mg at 11/21/22 0813   alum & mag hydroxide-simeth (MAALOX/MYLANTA) 200-200-20 MG/5ML suspension 30 mL  30 mL Oral Q4H PRN Caroline Sauger, NP       haloperidol (HALDOL) tablet 5 mg  5 mg Oral Q6H PRN Caroline Sauger, NP       And   benztropine (COGENTIN) tablet 1 mg  1 mg Oral Q6H PRN Caroline Sauger, NP        busPIRone (BUSPAR) tablet 15 mg  15 mg Oral BID Jesenia Spera T, MD   15 mg at 12/02/22 [id number]   diazepam (VALIUM) injection 5 mg  5 mg Intramuscular Q4H PRN Amyiah Gaba, Madie Reno, MD       diphenhydrAMINE (BENADRYL) capsule 50 mg  50 mg Oral Q6H PRN Dontasia Miranda T, MD   50 mg at 11/21/22 [id number]   Or   diphenhydrAMINE (BENADRYL) injection 50 mg  50 mg Intramuscular Q6H PRN Malicia Blasdel T, MD       haloperidol (HALDOL) tablet 5 mg  5 mg Oral Q6H PRN Siena Poehler T, MD   5 mg at 11/19/22 1952   Or   haloperidol lactate (HALDOL) injection 5 mg  5 mg Intramuscular Q6H PRN Jeremy Mclamb T, MD       levothyroxine (SYNTHROID) tablet 75 mcg  75 mcg Oral Q0600 Shondale Quinley, Madie Reno, MD   75 mcg at 12/02/22 0832   lithium carbonate (ESKALITH) ER tablet 450 mg  450 mg Oral Q12H Yacine Garriga T, MD   450 mg at 12/02/22 0835   risperiDONE (RISPERDAL M-TABS) disintegrating tablet 2 mg  2 mg Oral Q8H PRN Caroline Sauger, NP   2 mg at 11/17/22 1146   And   LORazepam (ATIVAN) tablet 1 mg  1 mg Oral PRN Caroline Sauger, NP       LORazepam (ATIVAN) tablet 2 mg  2 mg Oral Q4H PRN Kamryn Gauthier T, MD   2 mg at 11/26/22 2155   magnesium hydroxide (MILK OF MAGNESIA) suspension 30 mL  30 mL Oral Daily PRN Caroline Sauger, NP       metoprolol succinate (TOPROL-XL) 24 hr tablet 50 mg  50 mg Oral Daily Parks Ranger, DO   50 mg at 12/02/22 0941   midodrine (PROAMATINE) tablet 5 mg  5 mg Oral BID Tab Rylee, Madie Reno, MD   5 mg at 12/02/22 [id number]   neomycin-bacitracin-polymyxin (NEOSPORIN) ointment   Topical Daily Parks Ranger, DO   Given at 11/30/22 [id number]   Oxcarbazepine (TRILEPTAL) tablet 600 mg  600 mg Oral BID Lorian Yaun, Madie Reno, MD   600 mg at 12/02/22 [id number]   QUEtiapine (SEROQUEL) tablet 400 mg  400 mg Oral QHS Nazire Fruth T, MD   400 mg at 12/01/22 2107   ramelteon (ROZEREM) tablet 8 mg  8 mg Oral QHS Amanat Hackel T, MD   8 mg at 12/01/22 2100   risperiDONE (RISPERDAL M-TABS) disintegrating tablet 1.5  mg  1.5 mg Oral QHS Richelle Glick T, MD   1.5 mg at 12/01/22 2100   risperiDONE (RISPERDAL M-TABS) disintegrating tablet 2 mg  2 mg Oral  Q8H PRN Caroline Sauger, NP   2 mg at 11/22/22 2032   rivaroxaban (XARELTO) tablet 20 mg  20 mg Oral q morning Lelania Bia T, MD   20 mg at 12/02/22 1020   temazepam (RESTORIL) capsule 15 mg  15 mg Oral QHS PRN Jontae Sonier, Madie Reno, MD        Lab Results:  Results for orders placed or performed during the hospital encounter of 11/15/22 (from the past 48 hour(s))  Lithium level     Status: None   Collection Time: 12/02/22  9:24 AM  Result Value Ref Range   Lithium Lvl 0.80 0.60 - 1.20 mmol/L    Comment: Performed at [facility], [address], [place], [place] [address]    Blood Alcohol level:  Lab Results  Component Value Date   [facility] <10 [id number]    Metabolic Disorder Labs: Lab Results  Component Value Date   HGBA1C 5.8 (H) 11/17/2022   MPG 120 11/17/2022   MPG 117 07/23/2022   No results found for: "PROLACTIN" Lab Results  Component Value Date   CHOL 129 11/17/2022   TRIG 81 11/17/2022   HDL 65 11/17/2022   CHOLHDL 2.0 11/17/2022   VLDL 16 11/17/2022   LDLCALC 48 11/17/2022   LDLCALC 87 07/23/2022    Physical Findings: AIMS:  , ,  ,  ,    CIWA:    COWS:     Musculoskeletal: Strength & Muscle Tone: within normal limits Gait & Station: normal Patient leans: N/A  Psychiatric Specialty Exam:  Presentation  General Appearance:  Bizarre  Eye Contact: Minimal  Speech: Pressured; Clear and Coherent  Speech Volume: Increased  Handedness: Right   Mood and Affect  Mood: Angry; Irritable  Affect: Inappropriate; Full Range   Thought Process  Thought Processes: Coherent  Descriptions of Associations:Circumstantial  Orientation:Full (Time, Place and Person)  Thought Content:Logical; Obsessions  History of Schizophrenia/Schizoaffective disorder:No  Duration of Psychotic Symptoms:Less than six  months  Hallucinations:No data recorded Ideas of Reference:None  Suicidal Thoughts:No data recorded Homicidal Thoughts:No data recorded  Sensorium  Memory: Immediate Good; Recent Good; Remote Good  Judgment: Poor  Insight: Fair   Community education officer  Concentration: Poor  Attention Span: Poor  Recall: Good  Fund of Knowledge: Good  Language: Good   Psychomotor Activity  Psychomotor Activity:No data recorded  Assets  Assets: Communication Skills; Desire for Improvement; Social Support; Resilience; Physical Health   Sleep  Sleep:No data recorded   Physical Exam: Physical Exam Vitals reviewed.  Constitutional:      Appearance: Normal appearance.  HENT:     Head: Normocephalic and atraumatic.     Mouth/Throat:     Pharynx: Oropharynx is clear.  Eyes:     Pupils: Pupils are equal, round, and reactive to light.  Cardiovascular:     Rate and Rhythm: Normal rate and regular rhythm.  Pulmonary:     Effort: Pulmonary effort is normal.     Breath sounds: Normal breath sounds.  Abdominal:     General: Abdomen is flat.     Palpations: Abdomen is soft.  Musculoskeletal:        General: Normal range of motion.  Skin:    General: Skin is warm and dry.  Neurological:     General: No focal deficit present.     Mental Status: He is alert. Mental status is at baseline.  Psychiatric:        Attention and Perception: Attention normal.  Mood and Affect: Mood normal. Affect is blunt.        Speech: Speech normal.        Behavior: Behavior normal.        Thought Content: Thought content normal.        Cognition and Memory: Cognition normal.        Judgment: Judgment normal.    Review of Systems  Constitutional: Negative.   HENT: Negative.    Eyes: Negative.   Respiratory: Negative.    Cardiovascular: Negative.   Gastrointestinal: Negative.   Musculoskeletal: Negative.   Skin: Negative.   Neurological: Negative.   Psychiatric/Behavioral:  Negative.     Blood pressure 129/64, pulse (!) 108, temperature 97.6 F (36.4 C), temperature source Oral, resp. rate 16, height 6\' 5"  (1.956 m), weight 102.1 kg, SpO2 98 %. Body mass index is 26.68 kg/m.   Treatment Plan Summary: Plan doing much better.  Psychosis appears to be improved.  Mood much more stable behavior normal.  Tolerating medicine well.  Has outpatient treatment in place.  We are going to go ahead and start making preparations for likely discharge tomorrow  Alethia Berthold, MD 12/02/2022, 1:23 PM

## 2022-12-02 NOTE — Plan of Care (Signed)
Problem: Education: Goal: Knowledge of General Education information will improve Description: Including pain rating scale, medication(s)/side effects and non-pharmacologic comfort measures 12/02/2022 1343 by Earvin Hansen, RN Outcome: Progressing 12/02/2022 1342 by Earvin Hansen, RN Outcome: Progressing   Problem: Health Behavior/Discharge Planning: Goal: Ability to manage health-related needs will improve 12/02/2022 1343 by Earvin Hansen, RN Outcome: Progressing 12/02/2022 1342 by Earvin Hansen, RN Outcome: Progressing   Problem: Clinical Measurements: Goal: Ability to maintain clinical measurements within normal limits will improve 12/02/2022 1343 by Earvin Hansen, RN Outcome: Progressing 12/02/2022 1342 by Earvin Hansen, RN Outcome: Progressing Goal: Will remain free from infection 12/02/2022 1343 by Earvin Hansen, RN Outcome: Progressing 12/02/2022 1342 by Earvin Hansen, RN Outcome: Progressing Goal: Diagnostic test results will improve 12/02/2022 1343 by Earvin Hansen, RN Outcome: Progressing 12/02/2022 1342 by Earvin Hansen, RN Outcome: Progressing Goal: Respiratory complications will improve 12/02/2022 1343 by Earvin Hansen, RN Outcome: Progressing 12/02/2022 1342 by Earvin Hansen, RN Outcome: Progressing Goal: Cardiovascular complication will be avoided 12/02/2022 1343 by Earvin Hansen, RN Outcome: Progressing 12/02/2022 1342 by Earvin Hansen, RN Outcome: Progressing   Problem: Activity: Goal: Risk for activity intolerance will decrease 12/02/2022 1343 by Earvin Hansen, RN Outcome: Progressing 12/02/2022 1342 by Earvin Hansen, RN Outcome: Progressing   Problem: Nutrition: Goal: Adequate nutrition will be maintained 12/02/2022 1343 by Earvin Hansen, RN Outcome: Progressing 12/02/2022 1342 by Earvin Hansen, RN Outcome: Progressing   Problem: Coping: Goal: Level of anxiety will decrease 12/02/2022 1343 by Earvin Hansen, RN Outcome:  Progressing 12/02/2022 1342 by Earvin Hansen, RN Outcome: Progressing   Problem: Elimination: Goal: Will not experience complications related to bowel motility 12/02/2022 1343 by Earvin Hansen, RN Outcome: Progressing 12/02/2022 1342 by Earvin Hansen, RN Outcome: Progressing Goal: Will not experience complications related to urinary retention 12/02/2022 1343 by Earvin Hansen, RN Outcome: Progressing 12/02/2022 1342 by Earvin Hansen, RN Outcome: Progressing   Problem: Pain Managment: Goal: General experience of comfort will improve 12/02/2022 1343 by Earvin Hansen, RN Outcome: Progressing 12/02/2022 1342 by Earvin Hansen, RN Outcome: Progressing   Problem: Safety: Goal: Ability to remain free from injury will improve 12/02/2022 1343 by Earvin Hansen, RN Outcome: Progressing 12/02/2022 1342 by Earvin Hansen, RN Outcome: Progressing   Problem: Skin Integrity: Goal: Risk for impaired skin integrity will decrease 12/02/2022 1343 by Earvin Hansen, RN Outcome: Progressing 12/02/2022 1342 by Earvin Hansen, RN Outcome: Progressing   Problem: Education: Goal: Knowledge of [place] Education information/materials will improve 12/02/2022 1343 by Earvin Hansen, RN Outcome: Progressing 12/02/2022 1342 by Earvin Hansen, RN Outcome: Progressing Goal: Emotional status will improve 12/02/2022 1343 by Earvin Hansen, RN Outcome: Progressing 12/02/2022 1342 by Earvin Hansen, RN Outcome: Progressing Goal: Mental status will improve 12/02/2022 1343 by Earvin Hansen, RN Outcome: Progressing 12/02/2022 1342 by Earvin Hansen, RN Outcome: Progressing Goal: Verbalization of understanding the information provided will improve 12/02/2022 1343 by Earvin Hansen, RN Outcome: Progressing 12/02/2022 1342 by Earvin Hansen, RN Outcome: Progressing   Problem: Activity: Goal: Interest or engagement in activities will improve 12/02/2022 1343 by Earvin Hansen,  RN Outcome: Progressing 12/02/2022 1342 by Earvin Hansen, RN Outcome: Progressing Goal: Sleeping patterns will improve 12/02/2022 1343 by Earvin Hansen, RN Outcome: Progressing 12/02/2022 1342 by Earvin Hansen, RN Outcome: Progressing   Problem: Coping: Goal:  Ability to verbalize frustrations and anger appropriately will improve 12/02/2022 1343 by Earvin Hansen, RN Outcome: Progressing 12/02/2022 1342 by Earvin Hansen, RN Outcome: Progressing Goal: Ability to demonstrate self-control will improve 12/02/2022 1343 by Earvin Hansen, RN Outcome: Progressing 12/02/2022 1342 by Earvin Hansen, RN Outcome: Progressing   Problem: Health Behavior/Discharge Planning: Goal: Identification of resources available to assist in meeting health care needs will improve 12/02/2022 1343 by Earvin Hansen, RN Outcome: Progressing 12/02/2022 1342 by Earvin Hansen, RN Outcome: Progressing Goal: Compliance with treatment plan for underlying cause of condition will improve 12/02/2022 1343 by Earvin Hansen, RN Outcome: Progressing 12/02/2022 1342 by Earvin Hansen, RN Outcome: Progressing   Problem: Physical Regulation: Goal: Ability to maintain clinical measurements within normal limits will improve 12/02/2022 1343 by Earvin Hansen, RN Outcome: Progressing 12/02/2022 1342 by Earvin Hansen, RN Outcome: Progressing   Problem: Safety: Goal: Periods of time without injury will increase 12/02/2022 1343 by Earvin Hansen, RN Outcome: Progressing 12/02/2022 1342 by Earvin Hansen, RN Outcome: Progressing

## 2022-12-02 NOTE — BHH Group Notes (Signed)
[facility] Group Notes:  (Nursing/MHT/Case Management/Adjunct)  Date:  12/02/2022  Time:  9:16 PM  Type of Therapy:   Wrap up  Participation Level:  Active  Participation Quality:  Appropriate  Affect:  Appropriate  Cognitive:  Alert  Insight:  Good  Engagement in Group:  Engaged and goal was glad to get a good night sleep.  Modes of Intervention:  Support  Summary of Progress/Problems:  Curtis Hodges 12/02/2022, 9:16 PM

## 2022-12-02 NOTE — Progress Notes (Signed)
   12/01/22 2100  Psych Admission Type (Psych Patients Only)  Admission Status Voluntary  Psychosocial Assessment  Patient Complaints Anxiety  Eye Contact Fair  Facial Expression Flat  Affect Appropriate to circumstance  Speech Logical/coherent  Interaction Assertive  Motor Activity Restless  Appearance/Hygiene Disheveled  Behavior Characteristics Appropriate to situation  Mood Preoccupied  Thought Process  Coherency Circumstantial  Content Preoccupation  Delusions None reported or observed  Perception WDL  Hallucination None reported or observed  Judgment Impaired  Confusion Mild  Danger to Self  Current suicidal ideation? Denies (Denies)  Agreement Not to Harm Self Yes  Description of Agreement verbal  Danger to Others  Danger to Others None reported or observed

## 2022-12-03 ENCOUNTER — Other Ambulatory Visit: Payer: Self-pay

## 2022-12-03 ENCOUNTER — Encounter: Payer: BC Managed Care – PPO | Admitting: Physical Therapy

## 2022-12-03 MED ORDER — LITHIUM CARBONATE ER 450 MG PO TBCR: 450.0000 mg | EXTENDED_RELEASE_TABLET | Freq: Two times a day (BID) | ORAL | 1 refills | Status: DC

## 2022-12-03 MED ORDER — RIVAROXABAN 20 MG PO TABS: 20.0000 mg | ORAL_TABLET | Freq: Every morning | ORAL | 1 refills | Status: DC

## 2022-12-03 MED ORDER — LITHIUM CARBONATE ER 450 MG PO TBCR
450.0000 mg | EXTENDED_RELEASE_TABLET | Freq: Two times a day (BID) | ORAL | 0 refills | Status: AC
  Filled 2022-12-03: qty 28, 14d supply, fill #0

## 2022-12-03 MED ORDER — TEMAZEPAM 15 MG PO CAPS: 15.0000 mg | ORAL_CAPSULE | Freq: Every evening | ORAL | 1 refills | Status: DC | PRN

## 2022-12-03 MED ORDER — LEVOTHYROXINE SODIUM 75 MCG PO TABS: 75.0000 ug | ORAL_TABLET | Freq: Every day | ORAL | 1 refills | Status: DC

## 2022-12-03 MED ORDER — QUETIAPINE FUMARATE 400 MG PO TABS
400.0000 mg | ORAL_TABLET | Freq: Every day | ORAL | 0 refills | Status: DC
  Filled 2022-12-03: qty 14, 14d supply, fill #0

## 2022-12-03 MED ORDER — RISPERIDONE 1 MG PO TABS
1.5000 mg | ORAL_TABLET | Freq: Every day | ORAL | 0 refills | Status: AC
  Filled 2022-12-03: qty 21, 14d supply, fill #0

## 2022-12-03 MED ORDER — RAMELTEON 8 MG PO TABS
8.0000 mg | ORAL_TABLET | Freq: Every day | ORAL | 0 refills | Status: DC
  Filled 2022-12-03: qty 14, 14d supply, fill #0

## 2022-12-03 MED ORDER — TEMAZEPAM 15 MG PO CAPS: 15.0000 mg | ORAL_CAPSULE | Freq: Every evening | ORAL | 1 refills | Status: AC | PRN

## 2022-12-03 MED ORDER — BUSPIRONE HCL 15 MG PO TABS
15.0000 mg | ORAL_TABLET | Freq: Two times a day (BID) | ORAL | 0 refills | Status: AC
  Filled 2022-12-03: qty 28, 14d supply, fill #0

## 2022-12-03 MED ORDER — BUSPIRONE HCL 15 MG PO TABS: 15.0000 mg | ORAL_TABLET | Freq: Two times a day (BID) | ORAL | 1 refills | Status: DC

## 2022-12-03 MED ORDER — LEVOTHYROXINE SODIUM 75 MCG PO TABS
75.0000 ug | ORAL_TABLET | Freq: Every day | ORAL | 0 refills | Status: DC
  Filled 2022-12-03: qty 14, 14d supply, fill #0

## 2022-12-03 MED ORDER — OXCARBAZEPINE 600 MG PO TABS: 600.0000 mg | ORAL_TABLET | Freq: Two times a day (BID) | ORAL | 1 refills | Status: DC

## 2022-12-03 MED ORDER — QUETIAPINE FUMARATE 400 MG PO TABS: 400.0000 mg | ORAL_TABLET | Freq: Every day | ORAL | 1 refills | Status: DC

## 2022-12-03 MED ORDER — METOPROLOL SUCCINATE ER 50 MG PO TB24: 50.0000 mg | ORAL_TABLET | Freq: Every day | ORAL | 1 refills | Status: DC

## 2022-12-03 MED ORDER — OXCARBAZEPINE 600 MG PO TABS
600.0000 mg | ORAL_TABLET | Freq: Two times a day (BID) | ORAL | 0 refills | Status: DC
  Filled 2022-12-03: qty 28, 14d supply, fill #0

## 2022-12-03 MED ORDER — MIDODRINE HCL 5 MG PO TABS
5.0000 mg | ORAL_TABLET | Freq: Two times a day (BID) | ORAL | 0 refills | Status: DC
  Filled 2022-12-03: qty 28, 14d supply, fill #0

## 2022-12-03 MED ORDER — MIDODRINE HCL 5 MG PO TABS: 5.0000 mg | ORAL_TABLET | Freq: Two times a day (BID) | ORAL | 1 refills | Status: DC

## 2022-12-03 MED ORDER — METOPROLOL SUCCINATE ER 50 MG PO TB24
50.0000 mg | ORAL_TABLET | Freq: Every day | ORAL | 0 refills | Status: DC
  Filled 2022-12-03: qty 14, 14d supply, fill #0

## 2022-12-03 MED ORDER — RAMELTEON 8 MG PO TABS: 8.0000 mg | ORAL_TABLET | Freq: Every day | ORAL | 1 refills | Status: DC

## 2022-12-03 MED ORDER — RISPERIDONE 1 MG PO TBDP: 1.5000 mg | ORAL_TABLET | Freq: Every day | ORAL | 1 refills | Status: DC

## 2022-12-03 MED ORDER — RIVAROXABAN 20 MG PO TABS
20.0000 mg | ORAL_TABLET | Freq: Every morning | ORAL | 0 refills | Status: DC
  Filled 2022-12-03: qty 14, 14d supply, fill #0

## 2022-12-03 NOTE — Plan of Care (Signed)
  Problem: Education: Goal: Knowledge of General Education information will improve Description: Including pain rating scale, medication(s)/side effects and non-pharmacologic comfort measures Outcome: Progressing   Problem: Health Behavior/Discharge Planning: Goal: Ability to manage health-related needs will improve Outcome: Progressing   Problem: Clinical Measurements: Goal: Ability to maintain clinical measurements within normal limits will improve Outcome: Progressing Goal: Will remain free from infection Outcome: Progressing Goal: Diagnostic test results will improve Outcome: Progressing Goal: Respiratory complications will improve Outcome: Progressing Goal: Cardiovascular complication will be avoided Outcome: Progressing   Problem: Activity: Goal: Risk for activity intolerance will decrease Outcome: Progressing   Problem: Nutrition: Goal: Adequate nutrition will be maintained Outcome: Progressing   Problem: Safety: Goal: Periods of time without injury will increase Outcome: Progressing   Problem: Physical Regulation: Goal: Ability to maintain clinical measurements within normal limits will improve Outcome: Progressing   Problem: Health Behavior/Discharge Planning: Goal: Identification of resources available to assist in meeting health care needs will improve Outcome: Progressing Goal: Compliance with treatment plan for underlying cause of condition will improve Outcome: Progressing   

## 2022-12-03 NOTE — BHH Group Notes (Signed)
[facility] Group Notes:  (Nursing/MHT/Case Management/Adjunct)  Date:  12/03/2022  Time:  10:01 AM  Type of Therapy:   Community Meeting  Participation Level:  Active  Participation Quality:  Appropriate  Affect:  Appropriate  Cognitive:  Appropriate  Insight:  Appropriate  Engagement in Group:  Engaged  Modes of Intervention:  Discussion, Education, and Support  Summary of Progress/Problems:  Adela Lank Jeferson Boozer 12/03/2022, 10:01 AM

## 2022-12-03 NOTE — Progress Notes (Signed)
Active on the unit hanging in the day room with his peers. Patient is pleasant and cooperative. He denies having si hi avh  He is med compliant and received his meds without issue. Appears to be at his baseline.  No issues or concerns needing addressed at this encounter.     C Butler-Nicholson, LPN

## 2022-12-03 NOTE — BHH Suicide Risk Assessment (Signed)
[facility] Discharge Suicide Risk Assessment   Principal Problem: Schizoaffective disorder, bipolar type [facility]) Discharge Diagnoses: Principal Problem:   Schizoaffective disorder, bipolar type ([place]) Active Problems:   Hypothyroidism, unspecified   Unspecified atrial fibrillation ([place])   Personal history of pulmonary embolism   Bilateral edema of lower extremity   Chronic systolic heart failure ([place])   Bipolar 1 disorder (HCC)   Total Time spent with patient: 30 minutes  Musculoskeletal: Strength & Muscle Tone: within normal limits Gait & Station: normal Patient leans: N/A  Psychiatric Specialty Exam  Presentation  General Appearance:  Bizarre  Eye Contact: Minimal  Speech: Pressured; Clear and Coherent  Speech Volume: Increased  Handedness: Right   Mood and Affect  Mood: Angry; Irritable  Duration of Depression Symptoms: No data recorded Affect: Inappropriate; Full Range   Thought Process  Thought Processes: Coherent  Descriptions of Associations:Circumstantial  Orientation:Full (Time, Place and Person)  Thought Content:Logical; Obsessions  History of Schizophrenia/Schizoaffective disorder:No  Duration of Psychotic Symptoms:Less than six months  Hallucinations:No data recorded Ideas of Reference:None  Suicidal Thoughts:No data recorded Homicidal Thoughts:No data recorded  Sensorium  Memory: Immediate Good; Recent Good; Remote Good  Judgment: Poor  Insight: Fair   Community education officer  Concentration: Poor  Attention Span: Poor  Recall: Good  Fund of Knowledge: Good  Language: Good   Psychomotor Activity  Psychomotor Activity:No data recorded  Assets  Assets: Communication Skills; Desire for Improvement; Social Support; Resilience; Physical Health   Sleep  Sleep:No data recorded  Physical Exam: Physical Exam Vitals and nursing note reviewed.  Constitutional:      Appearance: Normal appearance.  HENT:     Head:  Normocephalic and atraumatic.     Mouth/Throat:     Pharynx: Oropharynx is clear.  Eyes:     Pupils: Pupils are equal, round, and reactive to light.  Cardiovascular:     Rate and Rhythm: Normal rate and regular rhythm.  Pulmonary:     Effort: Pulmonary effort is normal.     Breath sounds: Normal breath sounds.  Abdominal:     General: Abdomen is flat.     Palpations: Abdomen is soft.  Musculoskeletal:        General: Normal range of motion.  Skin:    General: Skin is warm and dry.  Neurological:     General: No focal deficit present.     Mental Status: He is alert. Mental status is at baseline.  Psychiatric:        Attention and Perception: Attention normal.        Mood and Affect: Mood normal. Affect is blunt.        Speech: Speech normal.        Behavior: Behavior normal.        Thought Content: Thought content normal.        Cognition and Memory: Cognition normal.        Judgment: Judgment normal.    Review of Systems  Constitutional: Negative.   HENT: Negative.    Eyes: Negative.   Respiratory: Negative.    Cardiovascular: Negative.   Gastrointestinal: Negative.   Musculoskeletal: Negative.   Skin: Negative.   Neurological: Negative.   Psychiatric/Behavioral: Negative.     Blood pressure 125/68, pulse (!) 109, temperature 97.6 F (36.4 C), temperature source Oral, resp. rate 18, height 6\' 5"  (1.956 m), weight 102.1 kg, SpO2 100 %. Body mass index is 26.68 kg/m.  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  Patients presenting  with no risk factors but with morbid ruminations; may be classified as minimal risk based on the severity of the depressive symptoms   Follow-up [facility] Go on 12/13/2022.   Why: Please present for your scheduled appointment on March 28 at [place] information: [address] [place], [place] [address] [phone]                Plan Of Care/Follow-up recommendations:  Other:  Patient has shown great improvement.  He is lucid and clear and conversant about his treatment plan.  He agrees to outpatient follow-up with his usual psychiatrist.  Reviewed medication with patient.  Continue current medicine and outpatient follow-up.  Alethia Berthold, MD 12/03/2022, 10:03 AM

## 2022-12-03 NOTE — Group Note (Signed)
[facility] LCSW Group Therapy Note    Group Date: 12/03/2022 Start Time: 1300 End Time: 1400  Type of Therapy and Topic:  Group Therapy:  Overcoming Obstacles  Participation Level:  [facility] PARTICIPATION LEVEL: Active  Description of Group:   In this group patients will be encouraged to explore what they see as obstacles to their own wellness and recovery. They will be guided to discuss their thoughts, feelings, and behaviors related to these obstacles. The group will process together ways to cope with barriers, with attention given to specific choices patients can make. Each patient will be challenged to identify changes they are motivated to make in order to overcome their obstacles. This group will be process-oriented, with patients participating in exploration of their own experiences as well as giving and receiving support and challenge from other group members.  Therapeutic Goals: 1. Patient will identify personal and current obstacles as they relate to admission. 2. Patient will identify barriers that currently interfere with their wellness or overcoming obstacles.  3. Patient will identify feelings, thought process and behaviors related to these barriers. 4. Patient will identify two changes they are willing to make to overcome these obstacles:    Summary of Patient Progress Patient was present for the entirety of the group session. Patient was an active listener and participated in the topic of discussion, provided helpful advice to others, and added nuance to topic of conversation. Patient shared he has difficulty communicating with his coworkers which contributes to added stress. Patient acknowledges he has issues communicating which is compounded when working with other who find communication difficult.  Therapeutic Modalities:   Cognitive Behavioral Therapy Solution Focused Therapy Motivational Interviewing Relapse Prevention  Therapy   Durenda Hurt, [place]

## 2022-12-03 NOTE — Group Note (Signed)
Recreation Therapy Group Note   Group Topic:Goal Setting  Group Date: 12/03/2022 Start Time: 1000 End Time: 1045 Facilitators: Vilma Prader, LRT, CTRS Location:  Craft Room  Group Description: Scientist, physiological. Patients were given many different magazines, a glue stick, markers, and a piece of cardstock paper. LRT and pts discussed the importance of having goals in life. LRT and pts discussed the difference between short-term and long-term goals. LRT encouraged pts to create a vision board, with images they picked and then cut out by LRT from the magazine, for themselves, that capture their short and long-term goals. On the back of the paper, pt encouraged to write 3 different coping skills that can help them reach those goals. LRT encouraged pts to show and explain their vision board to the group once complete. LRT offered to laminate vision board once dry and complete.    Affect/Mood: Appropriate and Flat   Participation Level: Active and Engaged   Participation Quality: Independent   Behavior: Alert, Appropriate, and Calm   Speech/Thought Process: Coherent   Insight: Moderate   Judgement: Moderate   Modes of Intervention: Activity and Guided Discussion   Patient Response to Interventions:  Receptive   Education Outcome:  Acknowledges education   Clinical Observations/Individualized Feedback: Curtis Hodges was active in their participation of session activities and group discussion. Pt identified "work" as his short term goal and "retirement and travel." Pt had flat affect, however shared that he is leaving tomorrow which he became bright when talking about.    Plan: Continue to engage patient in RT group sessions 2-3x/week.   Vilma Prader, LRT, CTRS 12/03/2022 11:09 AM

## 2022-12-03 NOTE — Progress Notes (Signed)
  [facility] Adult Case Management Discharge Plan :  Will you be returning to the same living situation after discharge:  Yes,  Patient to discharge to place of residence.  At discharge, do you have transportation home?: Yes,  Patient's sister to provide transportation from hospital.  Do you have the ability to pay for your medications: Yes,  BCBS Private Ins.   Release of information consent forms completed and in the chart;  Patient's signature needed at discharge.  Patient to Follow up at:  Follow-up [place]. Go on 12/13/2022.   Why: Please present for your scheduled appointment on March 28 at [place] information: [address] [place],  [address] [phone]                Next level of care provider has access to [place] and Suicide Prevention discussed: Yes,  SPE completed with patient and Merrilee Seashore, mother, [phone]   Has patient been referred to the Quitline?: N/A patient is not a smoker Tobacco Use: Low Risk  (11/15/2022)   Patient History    Smoking Tobacco Use: Never    Smokeless Tobacco Use: Never    Passive Exposure: Not on file   Patient has been referred for addiction treatment: N/A Patient denies active substance use, UDS Negative for all, screened low risk during nursing admission (see SDH quick tab).  Social History   Substance and Sexual Activity  Drug Use Never   Social History   Substance and Sexual Activity  Alcohol Use Not Currently   Larose Kells 12/03/2022, 9:31 AM

## 2022-12-03 NOTE — Discharge Summary (Signed)
Physician Discharge Summary Note  Patient:  Curtis Hodges is an 52 y.o., male MRN:  [id number] DOB:  1971-08-31 Patient phone:  [phone] (home)  Patient address:   [address] [place] [address],  Total Time spent with patient: 30 minutes  Date of Admission:  11/15/2022 Date of Discharge: 12/03/2022  Reason for Admission: Patient was admitted because of the emergence of psychotic symptoms with disorganized thinking paranoia confusion and agitation and poor self-care.  History of chronic recurrent mental health illness  Principal Problem: Schizoaffective disorder, bipolar type ([place]) Discharge Diagnoses: Principal Problem:   Schizoaffective disorder, bipolar type ([place]) Active Problems:   Hypothyroidism, unspecified   Unspecified atrial fibrillation ([place])   Personal history of pulmonary embolism   Bilateral edema of lower extremity   Chronic systolic heart failure (HCC)   Bipolar 1 disorder (HCC)   Past Psychiatric History: Patient has a history of chronic mental health problems dating back to late adolescence or his early 71s.  Diagnoses have included bipolar disorder and schizophrenia.  The picture to me looks most consistent with schizoaffective disorder with chronic levels of schizophrenia like symptoms and intermittent episodes of decompensation with agitation characteristic of a mood disorder.  Past Medical History:  Past Medical History:  Diagnosis Date   Atrial fibrillation ([place])    Hypertension    Thyroid disease     Past Surgical History:  Procedure Laterality Date   CHOLECYSTECTOMY     GALLBLADDER SURGERY     Family History:  Family History  Problem Relation Age of Onset   COPD Mother    Atrial fibrillation Mother    Brain cancer Father    Family Psychiatric  History: See previous Social History:  Social History   Substance and Sexual Activity  Alcohol Use Not Currently     Social History   Substance and Sexual Activity  Drug  Use Never    Social History   Socioeconomic History   Marital status: Single    Spouse name: Not on file   Number of children: Not on file   Years of education: Not on file   Highest education level: Not on file  Occupational History   Not on file  Tobacco Use   Smoking status: Never   Smokeless tobacco: Never  Vaping Use   Vaping Use: Never used  Substance and Sexual Activity   Alcohol use: Not Currently   Drug use: Never   Sexual activity: Not Currently  Other Topics Concern   Not on file  Social History Narrative   Not on file   Social Determinants of Health   Financial Resource Strain: Low Risk  (07/23/2022)   Overall Financial Resource Strain (CARDIA)    Difficulty of Paying Living Expenses: Not hard at all  Food Insecurity: No Food Insecurity (11/15/2022)   Hunger Vital Sign    Worried About Running Out of Food in the Last Year: Never true    [place] in the Last Year: Never true  Transportation Needs: No Transportation Needs (11/15/2022)   PRAPARE - Hydrologist (Medical): No    Lack of Transportation (Non-Medical): No  Physical Activity: Insufficiently Active (07/23/2022)   Exercise Vital Sign    Days of Exercise per Week: 5 days    Minutes of Exercise per Session: 10 min  Stress: No Stress Concern Present (07/23/2022)   [place]    Feeling of Stress :  Only a little  Social Connections: Socially Isolated (07/23/2022)   Social Connection and Isolation Panel [NHANES]    Frequency of Communication with Friends and Family: Once a week    Frequency of Social Gatherings with Friends and Family: Once a week    Attends Religious Services: Never    Marine scientist or Organizations: No    Attends Music therapist: 1 to 4 times per year    Marital Status: Never married    Hospital Course: Admitted to psychiatric unit.  Patient was initially very  confused and odd in his behavior difficult to communicate with speaking in word salad at times.  He was not violent to anyone however and did not display any dangerous behavior towards himself and was cooperative with medicines.  There were some adjustments of medicine during his time here.  He was started on Seroquel at night for what appeared to be mixed bipolar symptoms as well as psychotic symptoms and the dose was titrated up to 400 mg which he has tolerated well.  In fact he has continued to need medicine to assist with sleep on top of that and is on as needed temazepam as well as his Rozerem.  Oxcarbazepine was continued.  After reviewing the chart it appeared to me that lithium had been particularly helpful and that contrary to what he had believed it had not been discontinued for cardiac reasons.  Cardiology consultation was obtained here and Dr. Jordan Likes felt that it would be reasonable and safe to restart lithium.  Made a couple other suggestions including discontinuing digoxin in the presence of lithium.  Patient has restarted lithium which has been titrated up.  Most recent level was 0.8.  Patient not having any side effects typical of lithium toxicity.  Mental status has improved dramatically over the last week.  He is able to sit still and hold a lucid conversation and expressed good insight about his illness.  Patient will be discontinued on his current medicine all of which is being prescribed at the local [place].  I have spoken to his outpatient psychiatrist Dr. Sharlett Iles who is agreeable to continued follow-up with the patient.  Physical Findings: AIMS:  , ,  ,  ,    CIWA:    COWS:     Musculoskeletal: Strength & Muscle Tone: within normal limits Gait & Station: normal Patient leans: N/A   Psychiatric Specialty Exam:  Presentation  General Appearance:  Bizarre  Eye Contact: Minimal  Speech: Pressured; Clear and Coherent  Speech  Volume: Increased  Handedness: Right   Mood and Affect  Mood: Angry; Irritable  Affect: Inappropriate; Full Range   Thought Process  Thought Processes: Coherent  Descriptions of Associations:Circumstantial  Orientation:Full (Time, Place and Person)  Thought Content:Logical; Obsessions  History of Schizophrenia/Schizoaffective disorder:No  Duration of Psychotic Symptoms:Less than six months  Hallucinations:No data recorded Ideas of Reference:None  Suicidal Thoughts:No data recorded Homicidal Thoughts:No data recorded  Sensorium  Memory: Immediate Good; Recent Good; Remote Good  Judgment: Poor  Insight: Fair   Community education officer  Concentration: Poor  Attention Span: Poor  Recall: Good  Fund of Knowledge: Good  Language: Good   Psychomotor Activity  Psychomotor Activity:No data recorded  Assets  Assets: Communication Skills; Desire for Improvement; Social Support; Resilience; Physical Health   Sleep  Sleep:No data recorded   Physical Exam: Physical Exam Vitals and nursing note reviewed.  Constitutional:      Appearance: Normal appearance.  HENT:  Head: Normocephalic and atraumatic.     Mouth/Throat:     Pharynx: Oropharynx is clear.  Eyes:     Pupils: Pupils are equal, round, and reactive to light.  Cardiovascular:     Rate and Rhythm: Normal rate and regular rhythm.  Pulmonary:     Effort: Pulmonary effort is normal.     Breath sounds: Normal breath sounds.  Abdominal:     General: Abdomen is flat.     Palpations: Abdomen is soft.  Musculoskeletal:        General: Normal range of motion.  Skin:    General: Skin is warm and dry.  Neurological:     General: No focal deficit present.     Mental Status: He is alert. Mental status is at baseline.  Psychiatric:        Attention and Perception: Attention normal.        Mood and Affect: Mood normal. Affect is blunt.        Speech: Speech normal.        Behavior:  Behavior normal.        Thought Content: Thought content normal.        Cognition and Memory: Cognition normal.        Judgment: Judgment normal.    Review of Systems  Constitutional: Negative.   HENT: Negative.    Eyes: Negative.   Respiratory: Negative.    Cardiovascular: Negative.   Gastrointestinal: Negative.   Musculoskeletal: Negative.   Skin: Negative.   Neurological: Negative.   Psychiatric/Behavioral: Negative.     Blood pressure 125/68, pulse (!) 109, temperature 97.6 F (36.4 C), temperature source Oral, resp. rate 18, height 6\' 5"  (1.956 m), weight 102.1 kg, SpO2 100 %. Body mass index is 26.68 kg/m.   Social History   Tobacco Use  Smoking Status Never  Smokeless Tobacco Never   Tobacco Cessation:  N/A, patient does not currently use tobacco products   Blood Alcohol level:  Lab Results  Component Value Date   ETH <10 123XX123    Metabolic Disorder Labs:  Lab Results  Component Value Date   HGBA1C 5.8 (H) 11/17/2022   MPG 120 11/17/2022   MPG 117 07/23/2022   No results found for: "PROLACTIN" Lab Results  Component Value Date   CHOL 129 11/17/2022   TRIG 81 11/17/2022   HDL 65 11/17/2022   CHOLHDL 2.0 11/17/2022   VLDL 16 11/17/2022   LDLCALC 48 11/17/2022   LDLCALC 87 07/23/2022    See Psychiatric Specialty Exam and Suicide Risk Assessment completed by Attending Physician prior to discharge.  Discharge destination:  Home  Is patient on multiple antipsychotic therapies at discharge:  No   Has Patient had three or more failed trials of antipsychotic monotherapy by history:  No  Recommended Plan for Multiple Antipsychotic Therapies: NA  Discharge Instructions     Diet - low sodium heart healthy   Complete by: As directed    Increase activity slowly   Complete by: As directed       Allergies as of 12/03/2022       Reactions   Cat Hair Extract         Medication List     STOP taking these medications    digoxin 0.25 MG  tablet Commonly known as: LANOXIN   mirtazapine 30 MG tablet Commonly known as: REMERON   mupirocin ointment 2 % Commonly known as: BACTROBAN       TAKE these medications  Indication  busPIRone 15 MG tablet Commonly known as: BUSPAR Take 1 tablet (15 mg total) by mouth 2 (two) times daily. What changed: how much to take  Indication: Anxiety Disorder   levothyroxine 75 MCG tablet Commonly known as: SYNTHROID Take 1 tablet (75 mcg total) by mouth daily at 6 (six) AM. Start taking on: December 04, 2022 What changed: when to take this  Indication: Underactive Thyroid   lithium carbonate 450 MG ER tablet Commonly known as: ESKALITH Take 1 tablet (450 mg total) by mouth every 12 (twelve) hours.  Indication: Manic-Depression   metoprolol succinate 50 MG 24 hr tablet Commonly known as: TOPROL-XL Take 1 tablet (50 mg total) by mouth daily. Take with or immediately following a meal. Start taking on: December 04, 2022 What changed:  when to take this additional instructions  Indication: Cardiac Failure, High Blood Pressure Disorder   midodrine 5 MG tablet Commonly known as: PROAMATINE Take 1 tablet (5 mg total) by mouth 2 (two) times daily. What changed: when to take this  Indication: Disorder of Low Blood Pressure   oxcarbazepine 600 MG tablet Commonly known as: TRILEPTAL Take 1 tablet (600 mg total) by mouth 2 (two) times daily.  Indication: Manic Phase of Manic-Depression   QUEtiapine 400 MG tablet Commonly known as: SEROQUEL Take 1 tablet (400 mg total) by mouth at bedtime.  Indication: Manic-Depression   ramelteon 8 MG tablet Commonly known as: ROZEREM Take 1 tablet (8 mg total) by mouth at bedtime.  Indication: Trouble Sleeping   risperiDONE 1 MG disintegrating tablet Commonly known as: RISPERDAL M-TABS Take 1.5 tablets (1.5 mg total) by mouth at bedtime. What changed:  medication strength how much to take when to take this  Indication: MIXED BIPOLAR  AFFECTIVE DISORDER   rivaroxaban 20 MG Tabs tablet Commonly known as: Xarelto Take 1 tablet (20 mg total) by mouth every morning. Start taking on: December 04, 2022  Indication: Atrial Fibrillation with Blood Vessel Occlusion Process   temazepam 15 MG capsule Commonly known as: RESTORIL Take 1 capsule (15 mg total) by mouth at bedtime as needed for sleep.  Indication: [place]. Go on 12/13/2022.   Why: Please present for your scheduled appointment on March 28 at 1130AM. Contact information: [address] [place], [place] [address] [phone]                Follow-up recommendations:  Other:  Follow-up with her usual outpatient psychiatrist.  Comments: Prescriptions sent to local [place].  Signed: Alethia Berthold, MD 12/03/2022, 10:09 AM

## 2022-12-03 NOTE — Progress Notes (Signed)
Patient ID: Curtis Hodges, male   DOB: Dec 20, 1970, 52 y.o.   MRN: [id number]  Discharge Note:  Patient denies SI/HI/AVH at this time. Discharge instructions, AVS, prescriptions, return to work note, medication supply, and transition record gone over with patient. Patient given a copy of his Suicide Safety Plan. Patient agrees to comply with medication management, follow-up visit, and outpatient therapy. Patient belongings returned to patient. Patient questions and concerns addressed and answered. Patient ambulatory off unit. Patient discharged to home with his Sister.

## 2022-12-03 NOTE — Progress Notes (Signed)
D- Patient alert and oriented. Patient presents in a pleasant mood on assessment stating that he slept good last night and had no complaints to voice to this Probation officer. Patient denies SI, HI, AVH, and pain at this time. Patient also denies any signs/symptoms of depression and anxiety, stating that overall, he is feeling "good". Per his self-inventory, patient's goal for today is to "get ready for discharge which will hopefully be today", in which he will "stay on task", in order to achieve his goal.  A- Scheduled medications administered to patient, per MD orders. Support and encouragement provided.  Routine safety checks conducted every 15 minutes.  Patient informed to notify staff with problems or concerns.  R- No adverse drug reactions noted. Patient contracts for safety at this time. Patient compliant with medications and treatment plan. Patient receptive, calm, and cooperative. Patient interacts well with others on the unit.  Patient remains safe at this time.

## 2022-12-03 NOTE — Plan of Care (Signed)
  Problem: Education: Goal: Knowledge of General Education information will improve Description: Including pain rating scale, medication(s)/side effects and non-pharmacologic comfort measures Outcome: Adequate for Discharge   Problem: Health Behavior/Discharge Planning: Goal: Ability to manage health-related needs will improve Outcome: Adequate for Discharge   Problem: Clinical Measurements: Goal: Ability to maintain clinical measurements within normal limits will improve Outcome: Adequate for Discharge Goal: Will remain free from infection Outcome: Adequate for Discharge Goal: Diagnostic test results will improve Outcome: Adequate for Discharge Goal: Respiratory complications will improve Outcome: Adequate for Discharge Goal: Cardiovascular complication will be avoided Outcome: Adequate for Discharge   Problem: Activity: Goal: Risk for activity intolerance will decrease Outcome: Adequate for Discharge   Problem: Nutrition: Goal: Adequate nutrition will be maintained Outcome: Adequate for Discharge   Problem: Coping: Goal: Level of anxiety will decrease Outcome: Adequate for Discharge   Problem: Elimination: Goal: Will not experience complications related to bowel motility Outcome: Adequate for Discharge Goal: Will not experience complications related to urinary retention Outcome: Adequate for Discharge   Problem: Pain Managment: Goal: General experience of comfort will improve Outcome: Adequate for Discharge   Problem: Safety: Goal: Ability to remain free from injury will improve Outcome: Adequate for Discharge   Problem: Skin Integrity: Goal: Risk for impaired skin integrity will decrease Outcome: Adequate for Discharge   Problem: Education: Goal: Knowledge of [place] General Education information/materials will improve Outcome: Adequate for Discharge Goal: Emotional status will improve Outcome: Adequate for Discharge Goal: Mental status will  improve Outcome: Adequate for Discharge Goal: Verbalization of understanding the information provided will improve Outcome: Adequate for Discharge   Problem: Activity: Goal: Interest or engagement in activities will improve Outcome: Adequate for Discharge Goal: Sleeping patterns will improve Outcome: Adequate for Discharge   Problem: Coping: Goal: Ability to verbalize frustrations and anger appropriately will improve Outcome: Adequate for Discharge Goal: Ability to demonstrate self-control will improve Outcome: Adequate for Discharge   Problem: Health Behavior/Discharge Planning: Goal: Identification of resources available to assist in meeting health care needs will improve Outcome: Adequate for Discharge Goal: Compliance with treatment plan for underlying cause of condition will improve Outcome: Adequate for Discharge   Problem: Physical Regulation: Goal: Ability to maintain clinical measurements within normal limits will improve Outcome: Adequate for Discharge   Problem: Safety: Goal: Periods of time without injury will increase Outcome: Adequate for Discharge   

## 2022-12-04 ENCOUNTER — Encounter: Payer: BC Managed Care – PPO | Admitting: Physical Therapy

## 2022-12-04 ENCOUNTER — Telehealth: Payer: Self-pay

## 2022-12-04 NOTE — Transitions of Care (Post Inpatient/ED Visit) (Signed)
   12/04/2022  Name: Curtis Hodges MRN: [id number] DOB: [birth year]  Today's TOC FU Call Status: Today's TOC FU Call Status:: Successful TOC FU Call Competed TOC FU Call Complete Date: 12/04/22  Transition Care Management Follow-up Telephone Call Date of Discharge: 12/03/22 Discharge Facility: [facility]) Type of Discharge: Inpatient Admission Primary Inpatient Discharge Diagnosis:: bipolar How have you been since you were released from the hospital?: Better Any questions or concerns?: No  Items Reviewed: Did you receive and understand the discharge instructions provided?: Yes Medications obtained and verified?: Yes (Medications Reviewed) Any new allergies since your discharge?: No Dietary orders reviewed?: Yes Do you have support at home?: No  Home Care and Equipment/Supplies: [place] Ordered?: NA Any new equipment or medical supplies ordered?: NA  Functional Questionnaire: Do you need assistance with bathing/showering or dressing?: No Do you need assistance with meal preparation?: No Do you need assistance with eating?: No Do you have difficulty maintaining continence: No Do you need assistance with getting out of bed/getting out of a chair/moving?: No Do you have difficulty managing or taking your medications?: No  Follow up appointments reviewed: PCP Follow-up appointment confirmed?: NA Specialist Hospital Follow-up appointment confirmed?: Yes Follow-Up Specialty Provider:: [place] Do you need transportation to your follow-up appointment?: No Do you understand care options if your condition(s) worsen?: Yes-patient verbalized understanding    [place], [place] Nurse Health Advisor Direct Dial [phone]

## 2022-12-10 ENCOUNTER — Encounter: Payer: BC Managed Care – PPO | Admitting: Physical Therapy

## 2022-12-11 ENCOUNTER — Encounter: Payer: BC Managed Care – PPO | Admitting: Physical Therapy

## 2023-01-11 ENCOUNTER — Other Ambulatory Visit (HOSPITAL_COMMUNITY): Payer: Self-pay

## 2023-02-21 DIAGNOSIS — I890 Lymphedema, not elsewhere classified: Secondary | ICD-10-CM | POA: Diagnosis not present

## 2023-02-27 ENCOUNTER — Other Ambulatory Visit: Payer: Self-pay | Admitting: Nurse Practitioner

## 2023-02-27 NOTE — Telephone Encounter (Signed)
Medication Refill - Medication: levothyroxine (SYNTHROID) 75 MCG tablet    Has the patient contacted their pharmacy? No. (Agent: If no, request that the patient contact the pharmacy for the refill. If patient does not wish to contact the pharmacy document the reason why and proceed with request.) (Agent: If yes, when and what did the pharmacy advise?)  Preferred Pharmacy (with phone number or street name):  [facility] [place], [place] - [address] Phone: [phone]  Fax: [phone]     Has the patient been seen for an appointment in the last year OR does the patient have an upcoming appointment? Yes.    Agent: Please be advised that RX refills may take up to 3 business days. We ask that you follow-up with your pharmacy.

## 2023-02-28 NOTE — Telephone Encounter (Signed)
Requested medication (s) are due for refill today: see request  Requested medication (s) are on the active medication list: yes   Last refill:  12/04/22 #14 0 refills  Future visit scheduled: no   Notes to clinic:  last ordered by Audery Amel, MD 12/03/22. Do you want to refill Rx?     Requested Prescriptions  Pending Prescriptions Disp Refills   levothyroxine (SYNTHROID) 75 MCG tablet 14 tablet 0    Sig: Take 1 tablet (75 mcg total) by mouth daily at 6 (six) AM.     Endocrinology:  Hypothyroid Agents Passed - 02/27/2023  2:19 PM      Passed - TSH in normal range and within 360 days    TSH  Date Value Ref Range Status  11/17/2022 3.769 0.350 - 4.500 uIU/mL Final    Comment:    Performed by a 3rd Generation assay with a functional sensitivity of <=0.01 uIU/mL. Performed at [facility], [address] [place], [place] [address]   07/23/2022 3.86 0.40 - 4.50 mIU/L Final         Passed - Valid encounter within last 12 months    Recent Outpatient Visits           6 months ago COVID-19   [facility] M[facility]um, Oswaldo Conroy, PA-C   7 months ago Annual physical exam   [facility] Berniece Salines, FNP   1 year ago Atrial fibrillation, unspecified type [facility])   [facility] Berniece Salines, [place]

## 2023-03-03 MED ORDER — LEVOTHYROXINE SODIUM 75 MCG PO TABS: 75.0000 ug | ORAL_TABLET | Freq: Every day | ORAL | 0 refills | Status: DC

## 2023-04-01 ENCOUNTER — Ambulatory Visit (INDEPENDENT_AMBULATORY_CARE_PROVIDER_SITE_OTHER): Payer: BC Managed Care – PPO | Admitting: Nurse Practitioner

## 2023-04-01 ENCOUNTER — Encounter (INDEPENDENT_AMBULATORY_CARE_PROVIDER_SITE_OTHER): Payer: Self-pay | Admitting: Nurse Practitioner

## 2023-04-01 VITALS — BP 118/73 | HR 60 | Resp 16 | Wt 248.6 lb

## 2023-04-01 DIAGNOSIS — I890 Lymphedema, not elsewhere classified: Secondary | ICD-10-CM | POA: Diagnosis not present

## 2023-04-01 DIAGNOSIS — E039 Hypothyroidism, unspecified: Secondary | ICD-10-CM

## 2023-04-01 DIAGNOSIS — I10 Essential (primary) hypertension: Secondary | ICD-10-CM

## 2023-04-01 NOTE — Progress Notes (Signed)
Subjective:    Patient ID: Curtis Hodges, male    DOB: 1970/12/28, 52 y.o.   MRN: [id number] Chief Complaint  Patient presents with   Follow-up    6 month follow up    The patient returns to the office for followup evaluation regarding leg swelling.  The swelling has persisted but with the lymph pump is under much, much better controlled. The pain associated with swelling is decreased. There have not been any interval development of a ulcerations or wounds.  The patient denies problems with the pump, noting it is working well and the leggings are in good condition.  Since the previous visit the patient has been  using the lymph pump on a routine basis and  has noted significant improvement in the lymphedema.   Patient stated the lymph pump has been helpful with the treatment of the lymphedema.      Review of Systems  Cardiovascular:  Positive for leg swelling.  Skin:  Negative for wound.  All other systems reviewed and are negative.      Objective:   Physical Exam Vitals reviewed.  HENT:     Head: Normocephalic.  Cardiovascular:     Rate and Rhythm: Normal rate.     Pulses: Normal pulses.  Pulmonary:     Effort: Pulmonary effort is normal.  Musculoskeletal:     Right lower leg: 1+ Edema present.     Left lower leg: 1+ Edema present.  Skin:    General: Skin is warm and dry.  Neurological:     Mental Status: He is alert and oriented to person, place, and time.  Psychiatric:        Mood and Affect: Mood normal.        Behavior: Behavior normal.        Thought Content: Thought content normal.        Judgment: Judgment normal.     BP 118/73 (BP Location: Left Arm)   Pulse 60   Resp 16   Wt 248 lb 9.6 oz (112.8 kg)   BMI 29.48 kg/m   Past Medical History:  Diagnosis Date   Atrial fibrillation (HCC)    Hypertension    Thyroid disease     Social History   Socioeconomic History   Marital status: Single    Spouse name: Not on file   Number of children:  Not on file   Years of education: Not on file   Highest education level: Not on file  Occupational History   Not on file  Tobacco Use   Smoking status: Never   Smokeless tobacco: Never  Vaping Use   Vaping status: Never Used  Substance and Sexual Activity   Alcohol use: Not Currently   Drug use: Never   Sexual activity: Not Currently  Other Topics Concern   Not on file  Social History Narrative   Not on file   Social Determinants of Health   Financial Resource Strain: Low Risk  (07/23/2022)   Overall Financial Resource Strain (CARDIA)    Difficulty of Paying Living Expenses: Not hard at all  Food Insecurity: No Food Insecurity (11/15/2022)   Hunger Vital Sign    Worried About Running Out of Food in the Last Year: Never true    Ran Out of Food in the Last Year: Never true  Transportation Needs: No Transportation Needs (11/15/2022)   PRAPARE - Administrator, Civil Service (Medical): No    Lack of Transportation (Non-Medical): No  Physical Activity: Insufficiently Active (07/23/2022)   Exercise Vital Sign    Days of Exercise per Week: 5 days    Minutes of Exercise per Session: 10 min  Stress: No Stress Concern Present (07/23/2022)   [employer] of Occupational Health - Occupational Stress Questionnaire    Feeling of Stress : Only a little  Social Connections: Socially Isolated (07/23/2022)   Social Connection and Isolation Panel [NHANES]    Frequency of Communication with Friends and Family: Once a week    Frequency of Social Gatherings with Friends and Family: Once a week    Attends Religious Services: Never    Database administrator or Organizations: No    Attends Engineer, structural: 1 to 4 times per year    Marital Status: Never married  Intimate Partner Violence: Not At Risk (11/15/2022)   Humiliation, Afraid, Rape, and Kick questionnaire    Fear of Current or Ex-Partner: No    Emotionally Abused: No    Physically Abused: No    Sexually  Abused: No    Past Surgical History:  Procedure Laterality Date   CHOLECYSTECTOMY     GALLBLADDER SURGERY      Family History  Problem Relation Age of Onset   COPD Mother    Atrial fibrillation Mother    Brain cancer Father     Allergies  Allergen Reactions   Cat Hair Extract        Latest Ref Rng & Units 11/21/2022   10:01 AM 11/14/2022    4:23 PM 07/23/2022   10:22 AM  CBC  WBC 4.0 - 10.5 K/uL 8.5  8.0  6.4   Hemoglobin 13.0 - 17.0 g/dL 30.8  65.7  84.6   Hematocrit 39.0 - 52.0 % 45.0  42.5  41.2   Platelets 150 - 400 K/uL 297  321  297       CMP     Component Value Date/Time   NA 136 11/14/2022 1623   K 4.2 11/14/2022 1623   CL 104 11/14/2022 1623   CO2 24 11/14/2022 1623   GLUCOSE 124 (H) 11/14/2022 1623   BUN 20 11/14/2022 1623   CREATININE 0.92 11/14/2022 1623   CREATININE 0.82 07/23/2022 1022   CALCIUM 9.7 11/14/2022 1623   PROT 8.0 11/14/2022 1623   ALBUMIN 4.4 11/14/2022 1623   AST 26 11/14/2022 1623   ALT 22 11/14/2022 1623   ALKPHOS 114 11/14/2022 1623   BILITOT 0.5 11/14/2022 1623   EGFR 106 07/23/2022 1022   GFRNONAA >60 11/14/2022 1623     No results found.     Assessment & Plan:   1. Lymphedema Recommend:  No surgery or intervention at this point in time.    I have reviewed my discussion with the patient regarding lymphedema and why it  causes symptoms.  Patient will continue wearing graduated compression on a daily basis. The patient should put the compression on first thing in the morning and removing them in the evening. The patient should not sleep in the compression.   In addition, behavioral modification throughout the day will be continued.  This will include frequent elevation (such as in a recliner), use of over the counter pain medications as needed and exercise such as walking.  The systemic causes for chronic edema such as liver, kidney and cardiac etiologies does not appear to have significant changed over the past year.     The patient will continue aggressive use of the  lymph pump.  This  will continue to improve the edema control and prevent sequela such as ulcers and infections.   The patient will follow-up with me on an annual basis.   2. Primary hypertension Continue antihypertensive medications as already ordered, these medications have been reviewed and there are no changes at this time.  3. Hypothyroidism, unspecified type Can cause some swelling but currently on correct medications and appears to be well-controlled.   Current Outpatient Medications on File Prior to Visit  Medication Sig Dispense Refill   busPIRone (BUSPAR) 15 MG tablet Take 1 tablet (15 mg total) by mouth 2 (two) times daily. 28 tablet 0   levothyroxine (SYNTHROID) 75 MCG tablet Take 1 tablet (75 mcg total) by mouth daily at 6 (six) AM. 90 tablet 0   lithium carbonate (ESKALITH) 450 MG ER tablet Take 1 tablet (450 mg total) by mouth every 12 (twelve) hours. 28 tablet 0   metoprolol succinate (TOPROL-XL) 50 MG 24 hr tablet Take 1 tablet (50 mg total) by mouth daily. Take with or immediately following a meal. 14 tablet 0   midodrine (PROAMATINE) 5 MG tablet Take 1 tablet (5 mg total) by mouth 2 (two) times daily. 28 tablet 0   oxcarbazepine (TRILEPTAL) 600 MG tablet Take 1 tablet (600 mg total) by mouth 2 (two) times daily. 28 tablet 0   QUEtiapine (SEROQUEL) 400 MG tablet Take 1 tablet (400 mg total) by mouth at bedtime. 14 tablet 0   ramelteon (ROZEREM) 8 MG tablet Take 1 tablet (8 mg total) by mouth at bedtime. 14 tablet 0   risperiDONE (RISPERDAL) 1 MG tablet Take 1.5 tablets (1.5 mg total) by mouth at bedtime. 21 tablet 0   rivaroxaban (XARELTO) 20 MG TABS tablet Take 1 tablet (20 mg total) by mouth every morning. 14 tablet 0   temazepam (RESTORIL) 15 MG capsule Take 1 capsule (15 mg total) by mouth at bedtime as needed for sleep. 30 capsule 1   No current facility-administered medications on file prior to visit.    There are  no Patient Instructions on file for this visit. No follow-ups on file.   Georgiana Spinner, NP

## 2023-04-09 DIAGNOSIS — I4811 Longstanding persistent atrial fibrillation: Secondary | ICD-10-CM | POA: Diagnosis not present

## 2023-04-25 ENCOUNTER — Ambulatory Visit
Admission: EM | Admit: 2023-04-25 | Discharge: 2023-04-25 | Disposition: A | Discharge status: Home / Self Care | Payer: BC Managed Care – PPO | Attending: Emergency Medicine | Admitting: Emergency Medicine

## 2023-04-25 DIAGNOSIS — U071 COVID-19: Secondary | ICD-10-CM | POA: Insufficient documentation

## 2023-04-25 DIAGNOSIS — B349 Viral infection, unspecified: Secondary | ICD-10-CM | POA: Insufficient documentation

## 2023-04-25 NOTE — Discharge Instructions (Addendum)
Your symptoms today are most likely being caused by a virus and should steadily improve in time it can take up to 7 to 10 days before you truly start to see a turnaround however things will get better  The test is pending up to 24 hours, you will be notified of positive test results only, if positive you will need to quarantine if you are experiencing of fever, if no fever you will need to wear a mask if having symptoms    You can take Tylenol  as needed for fever reduction and pain relief.   For cough: honey 1/2 to 1 teaspoon (you can dilute the honey in water or another fluid).  You can also use guaifenesin and dextromethorphan for cough. You can use a humidifier for chest congestion and cough.  If you don't have a humidifier, you can sit in the bathroom with the hot shower running.      For sore throat: try warm salt water gargles, cepacol lozenges, throat spray, warm tea or water with lemon/honey, popsicles or ice, or OTC cold relief medicine for throat discomfort.   For congestion: take a daily anti-histamine like Zyrtec, Claritin, and a oral decongestant, such as pseudoephedrine.  You can also use Flonase 1-2 sprays in each nostril daily.   It is important to stay hydrated: drink plenty of fluids (water, gatorade/powerade/pedialyte, juices, or teas) to keep your throat moisturized and help further relieve irritation/discomfort.

## 2023-04-25 NOTE — ED Provider Notes (Signed)
Curtis Hodges    CSN: [id number] Arrival date & time: 04/25/23  1057      History   Chief Complaint Chief Complaint  Patient presents with   Chills    HPI Curtis Hodges is a 52 y.o. male.   Patient presents for evaluation of intermittent generalized headaches, mild nasal congestion, mild nonproductive cough, chills and diaphoresis beginning 1 day ago.,  Congestion and cough have resolved.  Denies presence of fever.  No known sick contact.  Has not attempted treatment of symptoms.  Denies respiratory history, non-smoker.  Requesting COVID testing.  Past Medical History:  Diagnosis Date   Atrial fibrillation (HCC)    Hypertension    Thyroid disease     Patient Active Problem List   Diagnosis Date Noted   Bipolar 1 disorder (HCC) 11/15/2022   Disruptive mood dysregulation disorder (HCC) 11/15/2022   Schizoaffective disorder, bipolar type (HCC) 11/15/2022   Chronic venous insufficiency 09/29/2022   Lymphedema 09/29/2022   Cellulitis of foot, left 08/11/2021   Bilateral edema of lower extremity 05/04/2021   Adrenal nodule (HCC) 05/04/2021   Onychodystrophy 05/04/2021   Longstanding persistent atrial fibrillation (HCC) 04/25/2021   Medication management 02/14/2021   Chronic systolic heart failure (HCC) 02/03/2021   Primary hypertension 02/03/2021   Bradycardia, unspecified 01/20/2021   Hypotension 01/09/2021   Single subsegmental pulmonary embolism without acute cor pulmonale (HCC) 01/09/2021   Swelling of toe of right foot 01/09/2021   NAFL (nonalcoholic fatty liver) 12/27/2020   Bipolar disorder, unspecified (HCC) 09/17/2020   Hypothyroidism, unspecified 09/17/2020   Unspecified atrial fibrillation (HCC) 09/17/2020   Personal history of pulmonary embolism 09/17/2020   Eosinophilia, unspecified 09/17/2020   Anemia, unspecified 09/17/2020   Cardiomyopathy, unspecified (HCC) 09/17/2020   Diastolic heart failure (HCC) 09/17/2020   Hypo-osmolality and  hyponatremia 09/17/2020   Long term (current) use of anticoagulants 09/17/2020    Past Surgical History:  Procedure Laterality Date   CHOLECYSTECTOMY     GALLBLADDER SURGERY         Home Medications    Prior to Admission medications   Medication Sig Start Date End Date Taking? Authorizing Provider  busPIRone (BUSPAR) 15 MG tablet Take 1 tablet (15 mg total) by mouth 2 (two) times daily. 12/03/22   Clapacs, Jackquline Denmark, MD  levothyroxine (SYNTHROID) 75 MCG tablet Take 1 tablet (75 mcg total) by mouth daily at 6 (six) AM. 03/03/23   Berniece Salines, FNP  lithium carbonate (ESKALITH) 450 MG ER tablet Take 1 tablet (450 mg total) by mouth every 12 (twelve) hours. 12/03/22   Clapacs, Jackquline Denmark, MD  metoprolol succinate (TOPROL-XL) 50 MG 24 hr tablet Take 1 tablet (50 mg total) by mouth daily. Take with or immediately following a meal. 12/04/22   Clapacs, Jackquline Denmark, MD  midodrine (PROAMATINE) 5 MG tablet Take 1 tablet (5 mg total) by mouth 2 (two) times daily. 12/03/22   Clapacs, Jackquline Denmark, MD  oxcarbazepine (TRILEPTAL) 600 MG tablet Take 1 tablet (600 mg total) by mouth 2 (two) times daily. 12/03/22   Clapacs, Jackquline Denmark, MD  QUEtiapine (SEROQUEL) 400 MG tablet Take 1 tablet (400 mg total) by mouth at bedtime. 12/03/22   Clapacs, Jackquline Denmark, MD  ramelteon (ROZEREM) 8 MG tablet Take 1 tablet (8 mg total) by mouth at bedtime. 12/03/22   Clapacs, Jackquline Denmark, MD  risperiDONE (RISPERDAL) 1 MG tablet Take 1.5 tablets (1.5 mg total) by mouth at bedtime. 12/03/22   Clapacs, Jackquline Denmark, MD  rivaroxaban (  XARELTO) 20 MG TABS tablet Take 1 tablet (20 mg total) by mouth every morning. 12/04/22   Clapacs, Jackquline Denmark, MD  temazepam (RESTORIL) 15 MG capsule Take 1 capsule (15 mg total) by mouth at bedtime as needed for sleep. 12/03/22   Clapacs, Jackquline Denmark, MD    Family History Family History  Problem Relation Age of Onset   COPD Mother    Atrial fibrillation Mother    Brain cancer Father     Social History Social History   Tobacco Use    Smoking status: Never   Smokeless tobacco: Never  Vaping Use   Vaping status: Never Used  Substance Use Topics   Alcohol use: Not Currently   Drug use: Never     Allergies   Cat hair extract   Review of Systems Review of Systems   Physical Exam Triage Vital Signs ED Triage Vitals [04/25/23 1115]  Encounter Vitals Group     BP (!) 143/99     Systolic BP Percentile      Diastolic BP Percentile      Pulse Rate 80     Resp 16     Temp 97.8 F (36.6 C)     Temp Source Oral     SpO2 96 %     Weight      Height      Head Circumference      Peak Flow      Pain Score 0     Pain Loc      Pain Education      Exclude from Growth Chart    No data found.  Updated Vital Signs BP (!) 143/99 (BP Location: Left Arm)   Pulse 80   Temp 97.8 F (36.6 C) (Oral)   Resp 16   SpO2 96%   Visual Acuity Right Eye Distance:   Left Eye Distance:   Bilateral Distance:    Right Eye Near:   Left Eye Near:    Bilateral Near:     Physical Exam Constitutional:      Appearance: Normal appearance.  HENT:     Head: Normocephalic.     Right Ear: Tympanic membrane, ear canal and external ear normal.     Left Ear: Tympanic membrane, ear canal and external ear normal.     Nose: Congestion present. No rhinorrhea.     Mouth/Throat:     Mouth: Mucous membranes are moist.     Pharynx: Oropharynx is clear. No posterior oropharyngeal erythema.  Eyes:     Extraocular Movements: Extraocular movements intact.  Cardiovascular:     Rate and Rhythm: Normal rate and regular rhythm.     Pulses: Normal pulses.     Heart sounds: Normal heart sounds.  Pulmonary:     Effort: Pulmonary effort is normal.     Breath sounds: Normal breath sounds.  Musculoskeletal:        General: Normal range of motion.  Skin:    General: Skin is warm and dry.  Neurological:     Mental Status: He is alert and oriented to person, place, and time. Mental status is at baseline.      UC Treatments / Results   Labs (all labs ordered are listed, but only abnormal results are displayed) Labs Reviewed  SARS CORONAVIRUS 2 (TAT 6-24 HRS)    EKG   Radiology No results found.  Procedures Procedures (including critical care time)  Medications Ordered in UC Medications - No data to display  Initial Impression /  Assessment and Plan / UC Course  I have reviewed the triage vital signs and the nursing notes.  Pertinent labs & imaging results that were available during my care of the patient were reviewed by me and considered in my medical decision making (see chart for details).  Viral illness  Patient is in no signs of distress nor toxic appearing.  Vital signs are stable.  Low suspicion for pneumonia, pneumothorax or bronchitis and therefore will defer imaging.COVID test is pending, reviewed quarantine guidelines per CDC recommendations, will defer usage of antivirals as he is taking Xarelto, has had COVID in the past and managed without complications. May use additional over-the-counter medications as needed for supportive care.  May follow-up with urgent care as needed if symptoms persist or worsen.   Final Clinical Impressions(s) / UC Diagnoses   Final diagnoses:  Viral illness     Discharge Instructions      Your symptoms today are most likely being caused by a virus and should steadily improve in time it can take up to 7 to 10 days before you truly start to see a turnaround however things will get better  The test is pending up to 24 hours, you will be notified of positive test results only, if positive you will need to quarantine if you are experiencing of fever, if no fever you will need to wear a mask if having symptoms    You can take Tylenol  as needed for fever reduction and pain relief.   For cough: honey 1/2 to 1 teaspoon (you can dilute the honey in water or another fluid).  You can also use guaifenesin and dextromethorphan for cough. You can use a humidifier for chest  congestion and cough.  If you don't have a humidifier, you can sit in the bathroom with the hot shower running.      For sore throat: try warm salt water gargles, cepacol lozenges, throat spray, warm tea or water with lemon/honey, popsicles or ice, or OTC cold relief medicine for throat discomfort.   For congestion: take a daily anti-histamine like Zyrtec, Claritin, and a oral decongestant, such as pseudoephedrine.  You can also use Flonase 1-2 sprays in each nostril daily.   It is important to stay hydrated: drink plenty of fluids (water, gatorade/powerade/pedialyte, juices, or teas) to keep your throat moisturized and help further relieve irritation/discomfort.    ED Prescriptions   None    PDMP not reviewed this encounter.   Valinda Hoar, NP 04/25/23 1139

## 2023-04-25 NOTE — ED Triage Notes (Signed)
Patient presents to UC for chills and HA yesterday. This morning has been sweating. Denies fever. Requesting COVID test.

## 2023-04-27 ENCOUNTER — Telehealth: Payer: Self-pay

## 2023-04-27 NOTE — Telephone Encounter (Signed)
Patient contacted Urgent Care regarding positive test results. Notified of positive result. Apologized for delay in notification.

## 2023-06-04 ENCOUNTER — Telehealth: Payer: Self-pay | Admitting: Nurse Practitioner

## 2023-06-04 NOTE — Telephone Encounter (Signed)
Patient has called and is requesting samples of Xarelto. Per patient, he states he has spilled his pills and did not want to pay the $700 to get this refilled. Please advise and contact patient back.   Patients callback # [phone]

## 2023-06-05 NOTE — Telephone Encounter (Signed)
Patient notified that we do not get samples of Xarelto

## 2023-06-26 ENCOUNTER — Other Ambulatory Visit: Payer: Self-pay | Admitting: Nurse Practitioner

## 2023-06-26 ENCOUNTER — Encounter: Payer: Self-pay | Admitting: Nurse Practitioner

## 2023-06-27 ENCOUNTER — Other Ambulatory Visit: Payer: Self-pay | Admitting: Nurse Practitioner

## 2023-06-27 MED ORDER — LEVOTHYROXINE SODIUM 75 MCG PO TABS: 75.0000 ug | ORAL_TABLET | Freq: Every day | ORAL | 0 refills | Status: DC

## 2023-06-27 NOTE — Telephone Encounter (Signed)
Requested medication (s) are due for refill today: see below  Requested medication (s) are on the active medication list: yes  Last refill:  today  Future visit scheduled: no  Notes to clinic:    Pharmacy comment: Alvogen brand is on backorder. May we change to a new brand?       Requested Prescriptions  Pending Prescriptions Disp Refills   levothyroxine (SYNTHROID) 75 MCG tablet [Pharmacy Med Name: LEVOTHYROXIN  TAB] 90 tablet 0    Sig: TAKE 1 TABLET BY MOUTH ONCE DAILY AT  6AM     Endocrinology:  Hypothyroid Agents Passed - 06/27/2023  3:29 PM      Passed - TSH in normal range and within 360 days    TSH  Date Value Ref Range Status  11/17/2022 3.769 0.350 - 4.500 uIU/mL Final    Comment:    Performed by a 3rd Generation assay with a functional sensitivity of <=0.01 uIU/mL. Performed at [facility], [address] [place], [place] [address]   07/23/2022 3.86 0.40 - 4.50 mIU/L Final         Passed - Valid encounter within last 12 months    Recent Outpatient Visits           10 months ago COVID-19   [facility] Mecum, Oswaldo Conroy, [place]   11 months ago Annual physical exam   [facility] Berniece Salines, FNP   1 year ago Atrial fibrillation, unspecified type [facility])   [facility] Berniece Salines, [place]

## 2023-07-16 NOTE — Telephone Encounter (Signed)
Spoke to [facility] pharmacist and agreed to change brand name per provider

## 2023-09-30 ENCOUNTER — Other Ambulatory Visit: Payer: Self-pay

## 2023-09-30 ENCOUNTER — Emergency Department
Admission: EM | Admit: 2023-09-30 | Discharge: 2023-09-30 | Disposition: A | Discharge status: Home / Self Care | Payer: BC Managed Care – PPO | Attending: Emergency Medicine | Admitting: Emergency Medicine

## 2023-09-30 ENCOUNTER — Emergency Department: Payer: BC Managed Care – PPO

## 2023-09-30 DIAGNOSIS — Z7901 Long term (current) use of anticoagulants: Secondary | ICD-10-CM | POA: Insufficient documentation

## 2023-09-30 DIAGNOSIS — R1011 Right upper quadrant pain: Secondary | ICD-10-CM | POA: Insufficient documentation

## 2023-09-30 DIAGNOSIS — R109 Unspecified abdominal pain: Secondary | ICD-10-CM | POA: Diagnosis present

## 2023-09-30 DIAGNOSIS — J984 Other disorders of lung: Secondary | ICD-10-CM

## 2023-09-30 LAB — CBC
HCT: 39.9 % (ref 39.0–52.0)
Hemoglobin: 12.9 g/dL — ABNORMAL LOW (ref 13.0–17.0)
MCH: 31.8 pg (ref 26.0–34.0)
MCHC: 32.3 g/dL (ref 30.0–36.0)
MCV: 98.3 fL (ref 80.0–100.0)
Platelets: 307 10*3/uL (ref 150–400)
RBC: 4.06 MIL/uL — ABNORMAL LOW (ref 4.22–5.81)
RDW: 13.1 % (ref 11.5–15.5)
WBC: 7.5 10*3/uL (ref 4.0–10.5)
nRBC: 0 % (ref 0.0–0.2)

## 2023-09-30 LAB — COMPREHENSIVE METABOLIC PANEL
ALT: 19 U/L (ref 0–44)
AST: 19 U/L (ref 15–41)
Albumin: 3.6 g/dL (ref 3.5–5.0)
Alkaline Phosphatase: 102 U/L (ref 38–126)
Anion gap: 10 (ref 5–15)
BUN: 22 mg/dL — ABNORMAL HIGH (ref 6–20)
CO2: 24 mmol/L (ref 22–32)
Calcium: 9.4 mg/dL (ref 8.9–10.3)
Chloride: 106 mmol/L (ref 98–111)
Creatinine, Ser: 0.88 mg/dL (ref 0.61–1.24)
GFR, Estimated: >60 mL/min (ref >60)
Glucose, Bld: 106 mg/dL — ABNORMAL HIGH (ref 70–99)
Potassium: 4 mmol/L (ref 3.5–5.1)
Sodium: 140 mmol/L (ref 135–145)
Total Bilirubin: 0.5 mg/dL (ref 0.0–1.2)
Total Protein: 7.3 g/dL (ref 6.5–8.1)

## 2023-09-30 LAB — URINALYSIS, ROUTINE W REFLEX MICROSCOPIC
Bilirubin Urine: NEGATIVE
Glucose, UA: NEGATIVE mg/dL
Hgb urine dipstick: NEGATIVE
Ketones, ur: NEGATIVE mg/dL
Leukocytes,Ua: NEGATIVE
Nitrite: NEGATIVE
Protein, ur: NEGATIVE mg/dL
Specific Gravity, Urine: 1.015 (ref 1.005–1.030)
pH: 5 (ref 5.0–8.0)

## 2023-09-30 LAB — LIPASE, BLOOD: Lipase: 35 U/L (ref 11–51)

## 2023-09-30 MED ORDER — DOXYCYCLINE HYCLATE 100 MG PO TABS: 100.0000 mg | ORAL_TABLET | Freq: Two times a day (BID) | ORAL | 0 refills | Status: AC

## 2023-09-30 MED ORDER — IOHEXOL 350 MG/ML SOLN
100.0000 mL | Freq: Once | INTRAVENOUS | Status: AC | PRN
  Administered 2023-09-30: 100 mL via INTRAVENOUS

## 2023-09-30 NOTE — ED Provider Notes (Signed)
 Emergency department handoff note  Care of this patient was signed out to me at the end of the previous provider shift.  All pertinent patient information was conveyed and all questions were answered.  Patient pending CT of the chest with angiography as well as abdomen and pelvis that showed and irritation with tree-in-bud configuration in the right lower lobe concerning for possible pneumonia versus pneumonitis.  Upon discussion with patient, he does not remember any specific aspiration events.  Patient states that he has had a cough for the past 5 days but denies any fevers.  Patient does endorse his cough being productive.  Given the duration of patient's cough and evidence of possible infection in the right lower lobe, will cover with empiric antibiotics as well as encourage follow-up with his primary care physician, Mliss pender.   The patient has been reexamined and is ready to be discharged.  All diagnostic results have been reviewed and discussed with the patient/family.  Care plan has been outlined and the patient/family understands all current diagnoses, results, and treatment plans.  There are no new complaints, changes, or physical findings at this time.  All questions have been addressed and answered.  Patient was instructed to, and agrees to follow-up with their primary care physician as well as return to the emergency department if any new or worsening symptoms develop.   Lunah Losasso K, MD 09/30/23 [phone]

## 2023-09-30 NOTE — ED Notes (Signed)
 First nurse note: pt sent from UC for abd pain.

## 2023-09-30 NOTE — ED Notes (Signed)
 Patient transported to CT

## 2023-09-30 NOTE — ED Provider Triage Note (Signed)
 Emergency Medicine Provider Triage Evaluation Note  RUNE MENDEZ , a 53 y.o. male  was evaluated in triage.  Pt complains of RUQ pain for 5-7 days.   Hx of cholecystectomy.  Review of Systems  Positive: \RUQ pain Negative: N/V/D, urinary symptoms, chills  Physical Exam  Pulse 100   Temp 97.9 F (36.6 C) (Oral)   Resp 20   SpO2 96%  Gen:   Awake, no distress   Resp:  Normal effort  MSK:   Moves extremities without difficulty  Other:    Medical Decision Making  Medically screening exam initiated at 11:54 AM.  Appropriate orders placed.  Arul Farabee Blasdel was informed that the remainder of the evaluation will be completed by another provider, this initial triage assessment does not replace that evaluation, and the importance of remaining in the ED until their evaluation is complete.    Cleaster Tinnie LABOR, PA-C 09/30/23 1155

## 2023-09-30 NOTE — ED Triage Notes (Signed)
 Patient states intermittent right upper quadrant pain for 5-7 days; denies N/V/D, urinary symptoms and chills.

## 2023-09-30 NOTE — ED Provider Notes (Signed)
 [facility] Provider Note    Event Date/Time   First MD Initiated Contact with Patient 09/30/23 1341     (approximate)   History   Abdominal Pain   HPI Curtis Hodges is a 53 y.o. male with history of A-fib, PE on Xarelto  presenting today for abdominal pain.  Patient states over the past 5 to 7 days he has had intermittent right upper quadrant/right lower chest wall pain.  No obvious alleviating or aggravating factors.  No associated nausea, vomiting, shortness of breath, chest pain, diarrhea, dysuria, leg pain, leg swelling.  Does have prior history of cholecystectomy.  Denies any missed doses of his Xarelto  recently.  Family most worried about possible blood clots.     Physical Exam   Triage Vital Signs: ED Triage Vitals  Encounter Vitals Group     BP 09/30/23 1154 125/80     Systolic BP Percentile --      Diastolic BP Percentile --      Pulse Rate 09/30/23 1153 100     Resp 09/30/23 1153 20     Temp 09/30/23 1153 97.9 F (36.6 C)     Temp Source 09/30/23 1153 Oral     SpO2 09/30/23 1153 96 %     Weight 09/30/23 1155 245 lb (111.1 kg)     Height 09/30/23 1155 6' 5 (1.956 m)     Head Circumference --      Peak Flow --      Pain Score 09/30/23 1154 3     Pain Loc --      Pain Education --      Exclude from Growth Chart --     Most recent vital signs: Vitals:   09/30/23 1153 09/30/23 1154  BP:  125/80  Pulse: 100   Resp: 20   Temp: 97.9 F (36.6 C)   SpO2: 96%    Physical Exam: I have reviewed the vital signs and nursing notes. General: Awake, alert, no acute distress.  Nontoxic appearing. Head:  Atraumatic, normocephalic.   ENT:  EOM intact, PERRL. Oral mucosa is pink and moist with no lesions. Neck: Neck is supple with full range of motion, No meningeal signs. Cardiovascular:  RRR, No murmurs. Peripheral pulses palpable and equal bilaterally. Respiratory:  Symmetrical chest wall expansion.  No rhonchi, rales, or wheezes.  Good air  movement throughout.  No use of accessory muscles.   Musculoskeletal:  No cyanosis or edema. Moving extremities with full ROM Abdomen:  Soft, mild tenderness to palpation in right upper quadrant, nondistended. Neuro:  GCS 15, moving all four extremities, interacting appropriately. Speech clear. Psych:  Calm, appropriate.   Skin:  Warm, dry, no rash.    ED Results / Procedures / Treatments   Labs (all labs ordered are listed, but only abnormal results are displayed) Labs Reviewed  COMPREHENSIVE METABOLIC PANEL - Abnormal; Notable for the following components:      Result Value   Glucose, Bld 106 (*)    BUN 22 (*)    All other components within normal limits  CBC - Abnormal; Notable for the following components:   RBC 4.06 (*)    Hemoglobin 12.9 (*)    All other components within normal limits  URINALYSIS, ROUTINE W REFLEX MICROSCOPIC - Abnormal; Notable for the following components:   Color, Urine YELLOW (*)    APPearance CLEAR (*)    All other components within normal limits  LIPASE, BLOOD     EKG  RADIOLOGY CT abdomen/pelvis and CTA chest pending at time of signout   PROCEDURES:  Critical Care performed: No  Procedures   MEDICATIONS ORDERED IN ED: Medications  iohexol  (OMNIPAQUE ) 350 MG/ML injection 100 mL (100 mLs Intravenous Contrast Given 09/30/23 1438)     IMPRESSION / MDM / ASSESSMENT AND PLAN / ED COURSE  I reviewed the triage vital signs and the nursing notes.                              Differential diagnosis includes, but is not limited to, PE, colitis, gastroenteritis, SBO  Patient's presentation is most consistent with acute complicated illness / injury requiring diagnostic workup.  Patient is a 53 year old male presenting today for intermittent right upper quadrant abdominal pain.  Prior history of cholecystectomy.  Family also concerned about possible PE given recent viral URI symptoms.  Will get CT imaging of chest/abdomen/pelvis for  further evaluation.  Patient signed out to oncoming provider pending results of CT imaging.  If all negative, patient otherwise stable with reassuring laboratory workup but can likely go home.     FINAL CLINICAL IMPRESSION(S) / ED DIAGNOSES   Final diagnoses:  Right upper quadrant abdominal pain     Rx / DC Orders   ED Discharge Orders     None        Note:  This document was prepared using Dragon voice recognition software and may include unintentional dictation errors.   Malvina Alm DASEN, MD 09/30/23 [phone]

## 2023-10-18 IMAGING — DX DG CHEST 2V
2 series · 2 of 2 positions shown · non-contrast
Comparison: None Available.

CLINICAL DATA: SOB with wheezing URI symptoms. Patient has history
CHF.

EXAM:
CHEST - 2 VIEW

[chest pa]
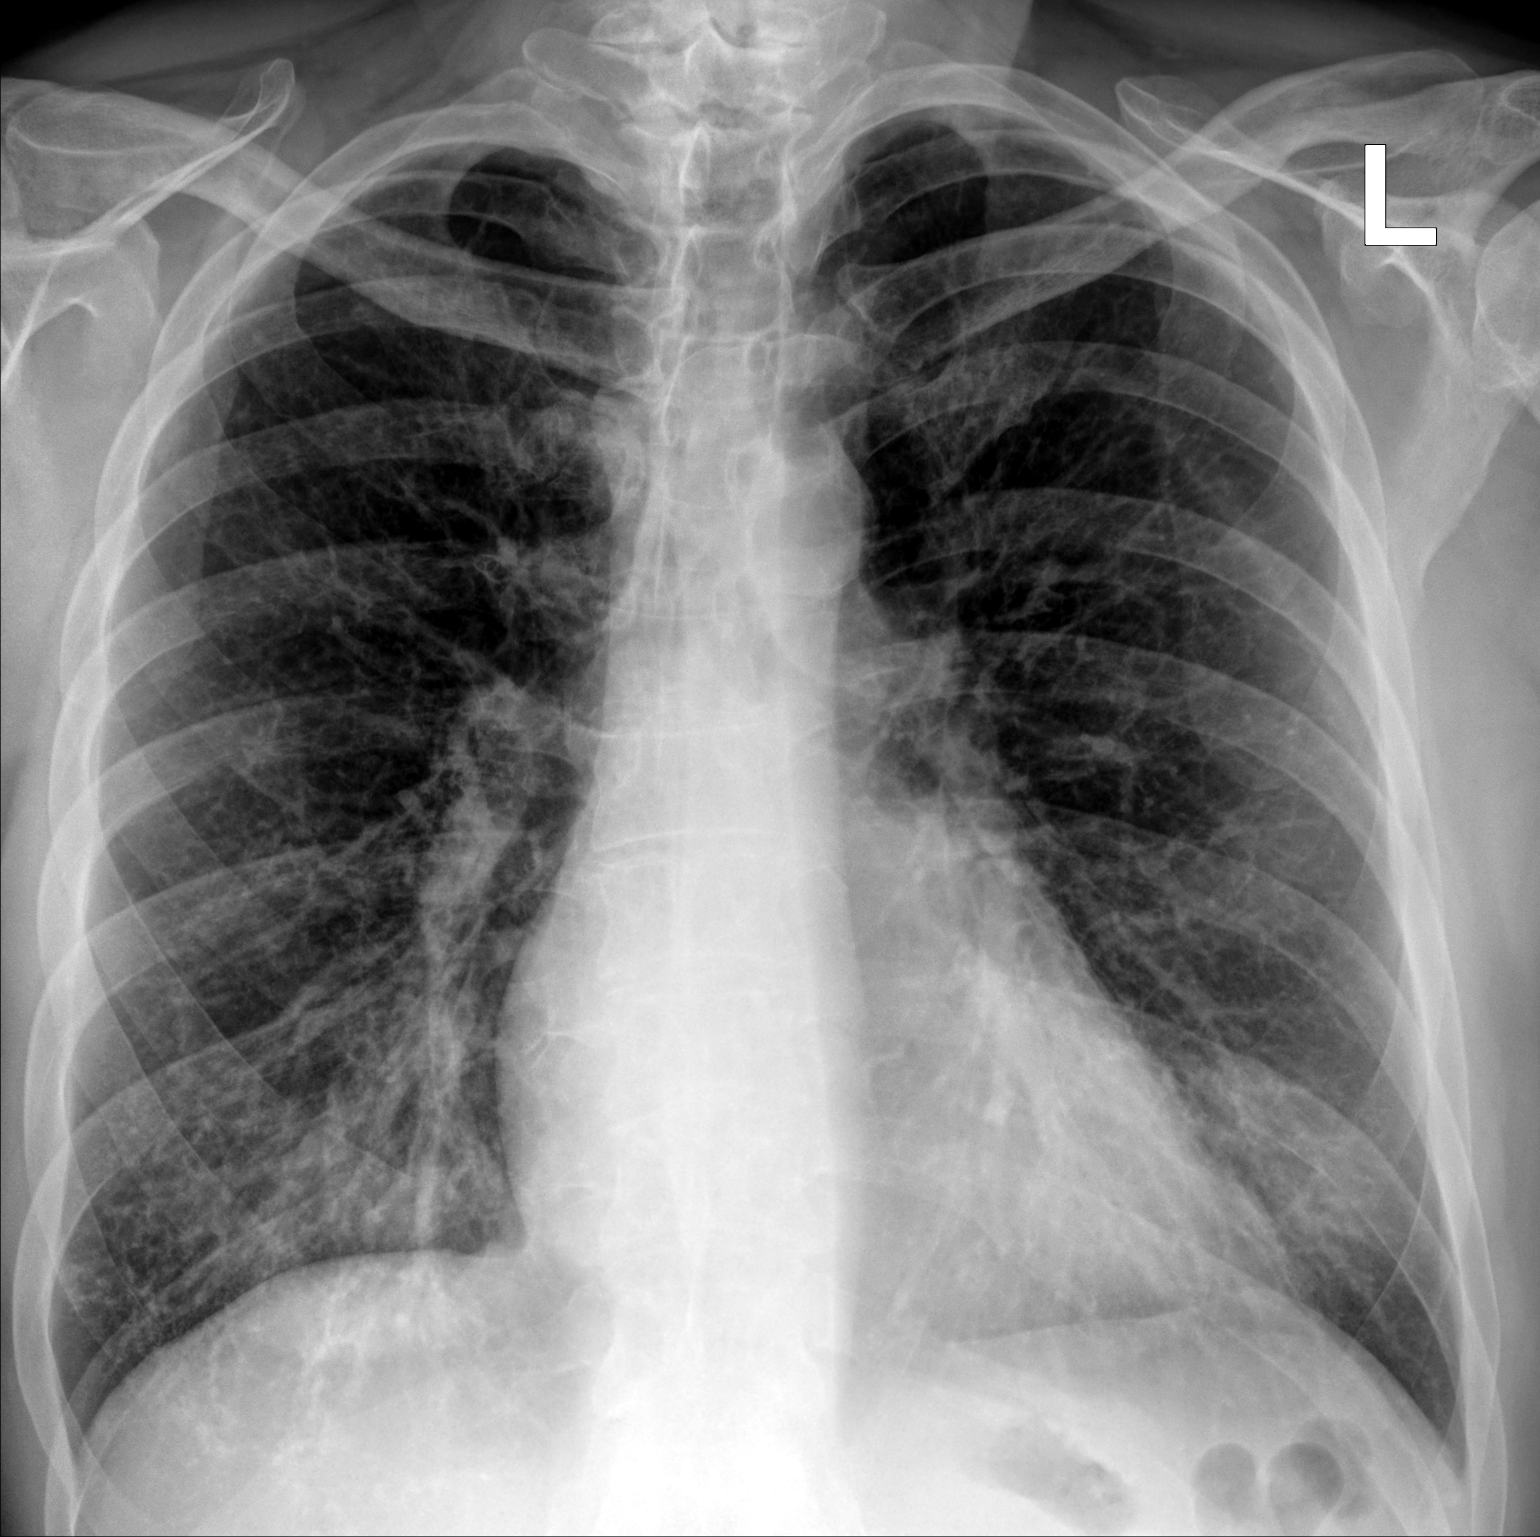

[chest lat]
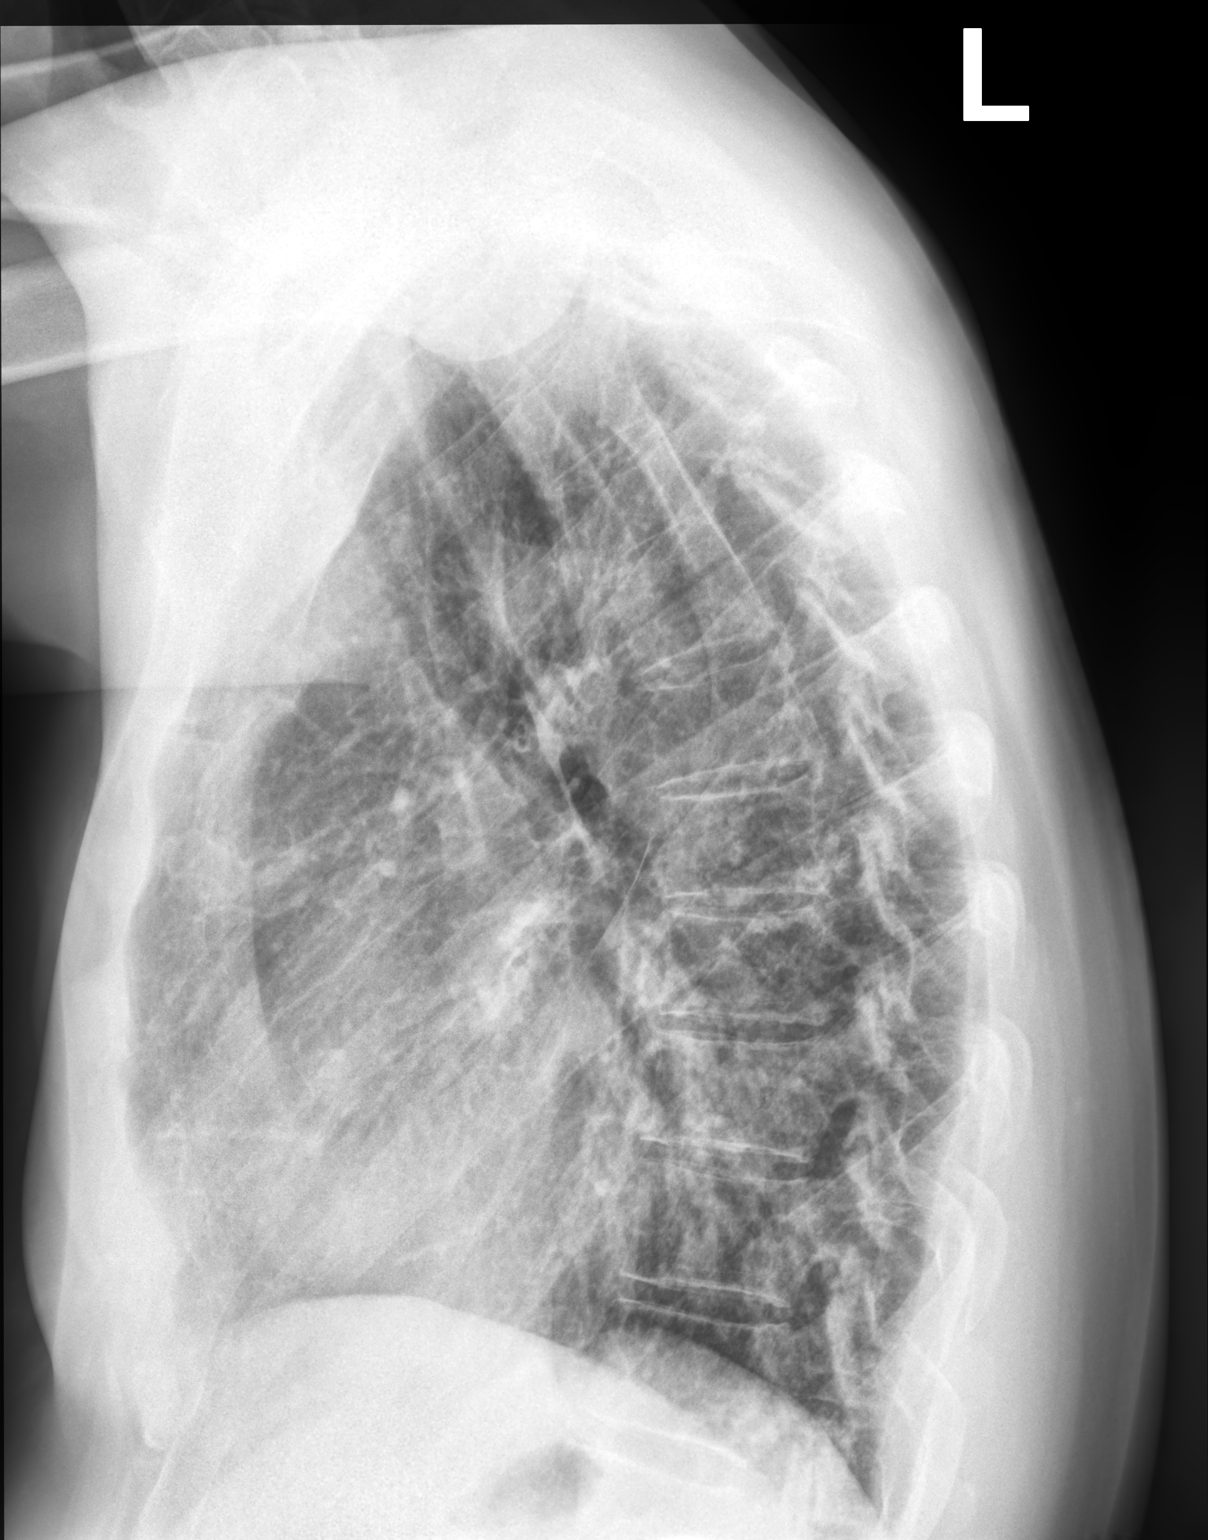

[2 of 2 positions shown; findings below may reference images not displayed]

FINDINGS: No consolidation. No visible pleural effusions or pneumothorax per
cardiomediastinal silhouette is within normal limits. No displaced
fracture.
IMPRESSION: No evidence of acute cardiopulmonary disease.

## 2023-10-23 ENCOUNTER — Other Ambulatory Visit: Payer: Self-pay | Admitting: Nurse Practitioner

## 2023-10-24 MED ORDER — LEVOTHYROXINE SODIUM 75 MCG PO TABS: 75.0000 ug | ORAL_TABLET | Freq: Every day | ORAL | 0 refills | Status: DC

## 2023-11-19 ENCOUNTER — Ambulatory Visit: Payer: Self-pay | Admitting: Nurse Practitioner

## 2023-11-19 NOTE — Telephone Encounter (Signed)
 Chief Complaint: Vomiting Symptoms: diarrhea x 3 past 24 hours Frequency: Onset Monday night Pertinent Negatives: Patient denies other symptoms Disposition: [] ED /[] Urgent Care (no appt availability in office) / [x] Appointment(In office/virtual)/ []  [facility]/ [] Home Care/ [] Refused Recommended Disposition /[] [facility]/ []  Follow-up with PCP Additional Notes: Patient says he has vomited 5 times over the past 24 hours, started in the middle of the night on Monday. He's sipping on gingerale, sprite, Pedialyte and able to keep a little bit of each down, denies abdominal pain. Scheduled with PCP tomorrow.    Copied From CRM [id number] [id number].  Reason for Triage: Please contact patient.Marland Kitchen throwing up and diarrhea.. no particular color for the throwup .Marland Kitchen Pls call him for possible appt [phone]    Reason for Disposition  [1] MILD or MODERATE vomiting AND [2] present > 48 hours (2 days) (Exception: Mild vomiting with associated diarrhea.)  Answer Assessment - Initial Assessment Questions 1. VOMITING SEVERITY: "How many times have you vomited in the past 24 hours?"     - MILD:  1 - 2 times/day    - MODERATE: 3 - 5 times/day, decreased oral intake without significant weight loss or symptoms of dehydration    - SEVERE: 6 or more times/day, vomits everything or nearly everything, with significant weight loss, symptoms of dehydration      5 2. ONSET: "When did the vomiting begin?"      Monday night after eating at 11pm 3. FLUIDS: "What fluids or food have you vomited up today?" "Have you been able to keep any fluids down?"     Able to keep a little bit of Pedialyte, sprite, ginger-ale down 4. ABDOMEN PAIN: "Are your having any abdomen pain?" If Yes : "How bad is it and what does it feel like?" (e.g., crampy, dull, intermittent, constant)      No 5. DIARRHEA: "Is there any diarrhea?" If Yes, ask: "How many times today?"      Twice today and once yesterday 6. CONTACTS: "Is there  anyone else in the family with the same symptoms?"      No 7. CAUSE: "What do you think is causing your vomiting?"     No, just started 8. HYDRATION STATUS: "Any signs of dehydration?" (e.g., dry mouth [not only dry lips], too weak to stand) "When did you last urinate?"     No, urinating fine 9. OTHER SYMPTOMS: "Do you have any other symptoms?" (e.g., fever, headache, vertigo, vomiting blood or coffee grounds, recent head injury)     No  Protocols used: Vomiting-A-AH

## 2023-11-20 ENCOUNTER — Ambulatory Visit: Admitting: Nurse Practitioner

## 2023-11-20 ENCOUNTER — Encounter: Payer: Self-pay | Admitting: Nurse Practitioner

## 2023-11-20 VITALS — BP 120/84 | HR 98 | Resp 18 | Ht 77.0 in | Wt 253.0 lb

## 2023-11-20 DIAGNOSIS — E039 Hypothyroidism, unspecified: Secondary | ICD-10-CM

## 2023-11-20 DIAGNOSIS — Z131 Encounter for screening for diabetes mellitus: Secondary | ICD-10-CM

## 2023-11-20 DIAGNOSIS — I10 Essential (primary) hypertension: Secondary | ICD-10-CM

## 2023-11-20 DIAGNOSIS — I5030 Unspecified diastolic (congestive) heart failure: Secondary | ICD-10-CM | POA: Diagnosis not present

## 2023-11-20 DIAGNOSIS — I4811 Longstanding persistent atrial fibrillation: Secondary | ICD-10-CM

## 2023-11-20 DIAGNOSIS — I429 Cardiomyopathy, unspecified: Secondary | ICD-10-CM

## 2023-11-20 DIAGNOSIS — R197 Diarrhea, unspecified: Secondary | ICD-10-CM

## 2023-11-20 DIAGNOSIS — F319 Bipolar disorder, unspecified: Secondary | ICD-10-CM

## 2023-11-20 DIAGNOSIS — Z13220 Encounter for screening for lipoid disorders: Secondary | ICD-10-CM

## 2023-11-20 DIAGNOSIS — Z79899 Other long term (current) drug therapy: Secondary | ICD-10-CM

## 2023-11-20 DIAGNOSIS — F250 Schizoaffective disorder, bipolar type: Secondary | ICD-10-CM

## 2023-11-20 DIAGNOSIS — R112 Nausea with vomiting, unspecified: Secondary | ICD-10-CM

## 2023-11-20 MED ORDER — LOPERAMIDE HCL 2 MG PO CAPS: 2.0000 mg | ORAL_CAPSULE | Freq: Four times a day (QID) | ORAL | 0 refills | Status: DC | PRN

## 2023-11-20 MED ORDER — ONDANSETRON 4 MG PO TBDP: 4.0000 mg | ORAL_TABLET | Freq: Three times a day (TID) | ORAL | 0 refills | Status: DC | PRN

## 2023-11-20 NOTE — Progress Notes (Signed)
 BP 120/84   Pulse 98   Resp 18   Ht 6\' 5"  (1.956 m)   Wt 253 lb (114.8 kg)   SpO2 100%   BMI 30.00 kg/m    Subjective:    Patient ID: Curtis Hodges, male    DOB: 03/28/1971, 53 y.o.   MRN: [id number]  HPI: Curtis Hodges is a 53 y.o. male  Chief Complaint  Patient presents with   Emesis    W/ diarrhea onset 2 days    Discussed the use of AI scribe software for clinical note transcription with the patient, who gave verbal consent to proceed.  History of Present Illness   Curtis Hodges is a 53 year old male with atrial fibrillation, cardiomyopathy, heart failure, hypertension, hypothyroidism, bipolar disorder, and schizoaffective disorder who presents with vomiting and diarrhea.  He has been experiencing vomiting and diarrhea since Monday morning. He describes a 'light throw up' on Monday night and again on Tuesday morning, followed by a 'big throw up' in the afternoon. There have been approximately five episodes of diarrhea. No fever is present, but he mentions occasional abdominal pain described as crampy with a sensation of 'pulling and tugging'. This morning, he noticed a green liquid in his vomit.  He is currently taking multiple medications including Restoril 15 mg at bedtime, Xarelto 20 mg daily, risperidone 1.5 mg at bedtime, Ramelteon 8 mg at bedtime, Seroquel 400 mg at bedtime, Trileptal 600 mg twice daily, Minitran 5 mg twice daily, metoprolol 50 mg daily, lithium 450 mg every 12 hours, levothyroxine 75 mcg daily, and Buspar 15 mg twice daily. He confirms that his medications have not changed recently.    He denies any chest pain, shortness of breath or palpitations.  Last saw cardiology in July.  No changes in treatment plan.      08/22/2022   11:06 AM 07/23/2022    9:46 AM 09/20/2021   10:43 AM  Depression screen PHQ 2/9  Decreased Interest 0 0 0  Down, Depressed, Hopeless 0 0 0  PHQ - 2 Score 0 0 0  Altered sleeping 0 0 0  Tired, decreased energy 0 0 0  Change  in appetite 0 0 0  Feeling bad or failure about yourself  0 0 0  Trouble concentrating 0 0 0  Moving slowly or fidgety/restless 0 0 0  Suicidal thoughts 0 0 0  PHQ-9 Score 0 0 0  Difficult doing work/chores Not difficult at all Not difficult at all Not difficult at all    Relevant past medical, surgical, family and social history reviewed and updated as indicated. Interim medical history since our last visit reviewed. Allergies and medications reviewed and updated.  Review of Systems  Ten systems reviewed and is negative except as mentioned in HPI      Objective:    BP 120/84   Pulse 98   Resp 18   Ht 6\' 5"  (1.956 m)   Wt 253 lb (114.8 kg)   SpO2 100%   BMI 30.00 kg/m    Wt Readings from Last 3 Encounters:  11/20/23 253 lb (114.8 kg)  09/30/23 245 lb (111.1 kg)  04/01/23 248 lb 9.6 oz (112.8 kg)    Physical Exam Vitals reviewed.  Constitutional:      Appearance: Normal appearance.  HENT:     Head: Normocephalic.     Mouth/Throat:     Mouth: Mucous membranes are moist.  Cardiovascular:     Rate and Rhythm: Normal rate.  Pulmonary:     Effort: Pulmonary effort is normal.  Abdominal:     General: Bowel sounds are normal.     Palpations: Abdomen is soft.     Tenderness: There is no abdominal tenderness.  Skin:    General: Skin is warm and dry.  Neurological:     General: No focal deficit present.     Mental Status: He is alert and oriented to person, place, and time. Mental status is at baseline.  Psychiatric:        Mood and Affect: Mood normal.        Behavior: Behavior normal.        Thought Content: Thought content normal.        Judgment: Judgment normal.     Results for orders placed or performed during the hospital encounter of 09/30/23  Lipase, blood   Collection Time: 09/30/23 11:56 AM  Result Value Ref Range   Lipase 35 11 - 51 U/L  Comprehensive metabolic panel   Collection Time: 09/30/23 11:56 AM  Result Value Ref Range   Sodium 140 135 -  145 mmol/L   Potassium 4.0 3.5 - 5.1 mmol/L   Chloride 106 98 - 111 mmol/L   CO2 24 22 - 32 mmol/L   Glucose, Bld 106 (H) 70 - 99 mg/dL   BUN 22 (H) 6 - 20 mg/dL   Creatinine, Ser 4.03 0.61 - 1.24 mg/dL   Calcium 9.4 8.9 - 47.4 mg/dL   Total Protein 7.3 6.5 - 8.1 g/dL   Albumin 3.6 3.5 - 5.0 g/dL   AST 19 15 - 41 U/L   ALT 19 0 - 44 U/L   Alkaline Phosphatase 102 38 - 126 U/L   Total Bilirubin 0.5 0.0 - 1.2 mg/dL   GFR, Estimated >25 >95 mL/min   Anion gap 10 5 - 15  CBC   Collection Time: 09/30/23 11:56 AM  Result Value Ref Range   WBC 7.5 4.0 - 10.5 K/uL   RBC 4.06 (L) 4.22 - 5.81 MIL/uL   Hemoglobin 12.9 (L) 13.0 - 17.0 g/dL   HCT 63.8 75.6 - 43.3 %   MCV 98.3 80.0 - 100.0 fL   MCH 31.8 26.0 - 34.0 pg   MCHC 32.3 30.0 - 36.0 g/dL   RDW 29.5 18.8 - 41.6 %   Platelets 307 150 - 400 K/uL   nRBC 0.0 0.0 - 0.2 %  Urinalysis, Routine w reflex microscopic -Urine, Clean Catch   Collection Time: 09/30/23 11:56 AM  Result Value Ref Range   Color, Urine YELLOW (A) YELLOW   APPearance CLEAR (A) CLEAR   Specific Gravity, Urine 1.015 1.005 - 1.030   pH 5.0 5.0 - 8.0   Glucose, UA NEGATIVE NEGATIVE mg/dL   Hgb urine dipstick NEGATIVE NEGATIVE   Bilirubin Urine NEGATIVE NEGATIVE   Ketones, ur NEGATIVE NEGATIVE mg/dL   Protein, ur NEGATIVE NEGATIVE mg/dL   Nitrite NEGATIVE NEGATIVE   Leukocytes,Ua NEGATIVE NEGATIVE       Assessment & Plan:   Problem List Items Addressed This Visit       Cardiovascular and Mediastinum   Cardiomyopathy, unspecified (HCC)   Diastolic heart failure (HCC)   Primary hypertension   Relevant Orders   CBC with Differential/Platelet   COMPLETE METABOLIC PANEL WITH GFR   Longstanding persistent atrial fibrillation (HCC)     Endocrine   Hypothyroidism, unspecified   Relevant Orders   TSH     Other   Bipolar 1 disorder (HCC)  Schizoaffective disorder, bipolar type (HCC)   Other Visit Diagnoses       Nausea and vomiting, unspecified  vomiting type    -  Primary   Relevant Medications   ondansetron (ZOFRAN-ODT) 4 MG disintegrating tablet     High risk medication use       Relevant Orders   Lithium level     Screening for diabetes mellitus       Relevant Orders   COMPLETE METABOLIC PANEL WITH GFR   Hemoglobin A1c     Screening for cholesterol level       Relevant Orders   Lipid panel     Diarrhea, unspecified type       Relevant Medications   loperamide (IMODIUM) 2 MG capsule        Assessment and Plan    Acute Gastroenteritis Recent onset of vomiting and diarrhea, no fever, mild abdominal discomfort. Likely viral gastroenteritis. -Order antiemetic and antidiarrheal medications to be sent to [facility] on [address]. -Advise on bland diet (toast, crackers, chicken broth, rice, apples) and hydration. -Order labs to rule out other causes.  Bipolar/Schizoaffective Disorder Stable on current regimen, but lithium level not checked recently. -Order lithium level.  Atrial Fibrillation, Cardiomyopathy, Heart Failure, Hypertension Stable on current regimen. -Continue Xarelto 20mg  daily and Metoprolol 50mg  daily.  Hypothyroidism Stable on current regimen. -Continue Levothyroxine daily.       - Restoril 15 mg at bedtime - Xarelto 20 mg daily - Risperidone 1.5 mg at bedtime - Ramelteon 8 mg at bedtime - Seroquel 400 mg at bedtime - Trileptal 600 mg twice a day - Minitran 5 mg twice a day - Metoprolol 50 mg daily - Lithium 450 mg every 12 hours - Levothyroxine 75 mcg daily - Buspar 15 mg twice daily  Follow up plan: Return in about 6 months (around 05/22/2024) for follow up.

## 2023-11-21 ENCOUNTER — Encounter: Payer: Self-pay | Admitting: Nurse Practitioner

## 2023-11-21 ENCOUNTER — Ambulatory Visit: Payer: Self-pay | Admitting: Nurse Practitioner

## 2023-11-21 ENCOUNTER — Other Ambulatory Visit: Payer: Self-pay

## 2023-11-21 ENCOUNTER — Other Ambulatory Visit: Payer: Self-pay | Admitting: Nurse Practitioner

## 2023-11-21 DIAGNOSIS — R7989 Other specified abnormal findings of blood chemistry: Secondary | ICD-10-CM

## 2023-11-21 DIAGNOSIS — E039 Hypothyroidism, unspecified: Secondary | ICD-10-CM

## 2023-11-21 MED ORDER — LEVOTHYROXINE SODIUM 88 MCG PO TABS: 88.0000 ug | ORAL_TABLET | Freq: Every day | ORAL | 0 refills | Status: DC

## 2023-11-21 NOTE — Telephone Encounter (Signed)
 Patient notified

## 2023-11-21 NOTE — Telephone Encounter (Signed)
  Chief Complaint: abnormal lab results  Disposition: [] ED /[] Urgent Care (no appt availability in office) / [] Appointment(In office/virtual)/ []  [facility]/ [] Home Care/ [] Refused Recommended Disposition /[] [facility]/ [x]  Follow-up with PCP Additional Notes: Called patient back regarding his request to discuss abnormal lab results. Informed patient that we are unable to discuss at this time, will need his PCP to review the labs. Advised patient to call back within 24 hours if he has not heard back.  Copied from CRM [id number]. Topic: Clinical - Lab/Test Results >> Nov 21, 2023 11:49 AM Elmarie Shiley S wrote: Reason for CRM: Patient called stating some of lab results came back abnormal and wants to know what he needs to do. Patient requesting call back. Reason for Disposition  Caller requesting routine or non-urgent lab result  Answer Assessment - Initial Assessment Questions 1. REASON FOR CALL or QUESTION: "What is your reason for calling today?" or "How can I best help you?" or "What question do you have that I can help answer?"     Patient called in for further information regarding abnormal lab results (lithium, TSH, CBC, CMP, lipid, hgb A1C). PCP has not reviewed them yet. Informed patient that message will be sent to PCP on his behalf to review results and discuss the plan of care with him.  2. CALLER: Document the source of call. (e.g., laboratory, patient).     Patient.  Protocols used: PCP Call - No Triage-A-AH

## 2023-11-22 ENCOUNTER — Ambulatory Visit: Payer: Self-pay | Admitting: Nurse Practitioner

## 2023-11-22 LAB — TSH: TSH: 7.22 m[IU]/L — ABNORMAL HIGH (ref 0.40–4.50)

## 2023-11-22 LAB — COMPLETE METABOLIC PANEL WITH GFR
AG Ratio: 1.2 (calc) (ref 1.0–2.5)
ALT: 251 U/L — ABNORMAL HIGH (ref 9–46)
AST: 170 U/L — ABNORMAL HIGH (ref 10–35)
Albumin: 4.2 g/dL (ref 3.6–5.1)
Alkaline phosphatase (APISO): 103 U/L (ref 35–144)
BUN/Creatinine Ratio: 22 (calc) (ref 6–22)
BUN: 28 mg/dL — ABNORMAL HIGH (ref 7–25)
CO2: 23 mmol/L (ref 20–32)
Calcium: 9.5 mg/dL (ref 8.6–10.3)
Chloride: 100 mmol/L (ref 98–110)
Creat: 1.27 mg/dL (ref 0.70–1.30)
Globulin: 3.5 g/dL (ref 1.9–3.7)
Glucose, Bld: 135 mg/dL — ABNORMAL HIGH (ref 65–99)
Potassium: 3.7 mmol/L (ref 3.5–5.3)
Sodium: 137 mmol/L (ref 135–146)
Total Bilirubin: 0.6 mg/dL (ref 0.2–1.2)
Total Protein: 7.7 g/dL (ref 6.1–8.1)
eGFR: 67 mL/min/{1.73_m2} (ref >=60)

## 2023-11-22 LAB — HEPATITIS PANEL, ACUTE
Hep A IgM: NONREACTIVE
Hep B C IgM: NONREACTIVE
Hepatitis B Surface Ag: NONREACTIVE
Hepatitis C Ab: NONREACTIVE

## 2023-11-22 LAB — CBC WITH DIFFERENTIAL/PLATELET
Absolute Lymphocytes: 1729 {cells}/uL (ref 850–3900)
Absolute Monocytes: 1188 {cells}/uL — ABNORMAL HIGH (ref 200–950)
Basophils Absolute: 29 {cells}/uL (ref 0–200)
Basophils Relative: 0.3 %
Eosinophils Absolute: 285 {cells}/uL (ref 15–500)
Eosinophils Relative: 3 %
HCT: 46 % (ref 38.5–50.0)
Hemoglobin: 15.6 g/dL (ref 13.2–17.1)
MCH: 31.3 pg (ref 27.0–33.0)
MCHC: 33.9 g/dL (ref 32.0–36.0)
MCV: 92.2 fL (ref 80.0–100.0)
MPV: 10.2 fL (ref 7.5–12.5)
Monocytes Relative: 12.5 %
Neutro Abs: 6270 {cells}/uL (ref 1500–7800)
Neutrophils Relative %: 66 %
Platelets: 283 10*3/uL (ref 140–400)
RBC: 4.99 10*6/uL (ref 4.20–5.80)
RDW: 12.7 % (ref 11.0–15.0)
Total Lymphocyte: 18.2 %
WBC: 9.5 10*3/uL (ref 3.8–10.8)

## 2023-11-22 LAB — LIPID PANEL
Cholesterol: 116 mg/dL (ref <200)
HDL: 52 mg/dL (ref >=40)
LDL Cholesterol (Calc): 46 mg/dL
Non-HDL Cholesterol (Calc): 64 mg/dL (ref <130)
Total CHOL/HDL Ratio: 2.2 (calc) (ref <5.0)
Triglycerides: 92 mg/dL (ref <150)

## 2023-11-22 LAB — TEST AUTHORIZATION

## 2023-11-22 LAB — HEMOGLOBIN A1C
Hgb A1c MFr Bld: 5.8 %{Hb} — ABNORMAL HIGH (ref <5.7)
Mean Plasma Glucose: 120 mg/dL
eAG (mmol/L): 6.6 mmol/L

## 2023-11-22 LAB — LITHIUM LEVEL: Lithium Lvl: 0.5 mmol/L — ABNORMAL LOW (ref 0.6–1.2)

## 2023-11-22 NOTE — Telephone Encounter (Signed)
 Copied from CRM [id number]. Topic: Clinical - Lab/Test Results >> Nov 22, 2023  1:35 PM Clayton Bibles wrote: Reason for CRM: He was checking to see if his hepatitis panel, acute had final result. I looked in chart and status states In Process. Please call him with results.   Chief Complaint: Information Only   Disposition: [] ED /[] Urgent Care (no appt availability in office) / [] Appointment(In office/virtual)/ []  [facility]/ [] Home Care/ [] Refused Recommended Disposition /[] [facility]/ [x]  Follow-up with PCP Additional Notes: This Triage RN contacted patient at this time regarding lab results and inquiry on resulting. Provided patient with information that results are still pending at this time. Patient verbalized understanding.

## 2023-11-23 LAB — HEPATIC FUNCTION PANEL
AG Ratio: 1.2 (calc) (ref 1.0–2.5)
ALT: 129 U/L — ABNORMAL HIGH (ref 9–46)
AST: 50 U/L — ABNORMAL HIGH (ref 10–35)
Albumin: 3.7 g/dL (ref 3.6–5.1)
Alkaline phosphatase (APISO): 94 U/L (ref 35–144)
Bilirubin, Direct: 0.3 mg/dL — ABNORMAL HIGH (ref 0.0–0.2)
Globulin: 3.1 g/dL (ref 1.9–3.7)
Indirect Bilirubin: 0.6 mg/dL (ref 0.2–1.2)
Total Bilirubin: 0.9 mg/dL (ref 0.2–1.2)
Total Protein: 6.8 g/dL (ref 6.1–8.1)

## 2023-11-25 ENCOUNTER — Encounter: Payer: Self-pay | Admitting: Nurse Practitioner

## 2024-02-04 ENCOUNTER — Encounter (INDEPENDENT_AMBULATORY_CARE_PROVIDER_SITE_OTHER): Payer: Self-pay

## 2024-03-25 ENCOUNTER — Telehealth (INDEPENDENT_AMBULATORY_CARE_PROVIDER_SITE_OTHER): Payer: Self-pay | Admitting: Vascular Surgery

## 2024-03-25 NOTE — Telephone Encounter (Signed)
 LVM for pt to CB to [facility] to make 1 year fu appt- 03/2024 consult. Recall on appt desk  1 year no studies see gs/fb

## 2024-04-02 ENCOUNTER — Other Ambulatory Visit: Payer: Self-pay | Admitting: Nurse Practitioner

## 2024-04-02 NOTE — Telephone Encounter (Signed)
 Copied from CRM [id number]. Topic: Clinical - Medication Refill >> Apr 02, 2024  1:29 PM Berwyn MATSU wrote: Medication: ivaroxaban (XARELTO ) 20 MG TABS tablet  Has the patient contacted their pharmacy? Yes (Agent: If no, request that the patient contact the pharmacy for the refill. If patient does not wish to contact the pharmacy document the reason why and proceed with request.) (Agent: If yes, when and what did the pharmacy advise?)  This is the patient's preferred pharmacy:  [facility] [address] [place] - [address] [place] [address] Phone: [phone] Fax: [phone]   Is this the correct pharmacy for this prescription? Yes If no, delete pharmacy and type the correct one.   Has the prescription been filled recently? Yes  Is the patient out of the medication? Yes  Has the patient been seen for an appointment in the last year OR does the patient have an upcoming appointment? Yes  Can we respond through MyChart? Yes  Agent: Please be advised that Rx refills may take up to 3 business days. We ask that you follow-up with your pharmacy.

## 2024-04-14 ENCOUNTER — Other Ambulatory Visit: Payer: Self-pay | Admitting: Nurse Practitioner

## 2024-04-14 DIAGNOSIS — E039 Hypothyroidism, unspecified: Secondary | ICD-10-CM

## 2024-04-14 NOTE — Telephone Encounter (Unsigned)
 Copied from CRM [id number]. Topic: Clinical - Medication Refill >> Apr 14, 2024 12:38 PM Nathanel BROCKS wrote: Medication:  levothyroxine  (SYNTHROID ) 88 MCG tablet  rivaroxaban  (XARELTO ) 20 MG TABS tablet metoprolol  succinate (TOPROL -XL) 50 MG 24 hr tablet midodrine  (PROAMATINE ) 5 MG tablet   Has the patient contacted their pharmacy? Yes   This is the patient's preferred pharmacy:  [facility] [address] [place] - [address] [place] [address] Phone: [phone] Fax: [phone]   Is this the correct pharmacy for this prescription? Yes If no, delete pharmacy and type the correct one.   Has the prescription been filled recently? Yes  Is the patient out of the medication? Yes  Has the patient been seen for an appointment in the last year OR does the patient have an upcoming appointment? Yes  Can we respond through MyChart? Yes  Agent: Please be advised that Rx refills may take up to 3 business days. We ask that you follow-up with your pharmacy.

## 2024-04-15 MED ORDER — LEVOTHYROXINE SODIUM 88 MCG PO TABS: 88.0000 ug | ORAL_TABLET | Freq: Every day | ORAL | 1 refills | Status: DC

## 2024-04-15 NOTE — Telephone Encounter (Signed)
 Requested medication (s) are due for refill today - unsure- expired Rx  Requested medication (s) are on the active medication list -yes  Future visit scheduled -no  Last refill: 12/04/22  Notes to clinic: outside provider with limited #, 1 request non delegated Rx  Requested Prescriptions  Pending Prescriptions Disp Refills   rivaroxaban  (XARELTO ) 20 MG TABS tablet 14 tablet 0    Sig: Take 1 tablet (20 mg total) by mouth every morning.     Hematology: Anticoagulants - rivaroxaban  Failed - 04/15/2024  1:44 PM      Failed - ALT in normal range and within 360 days    ALT  Date Value Ref Range Status  11/22/2023 129 (H) 9 - 46 U/L Final         Failed - AST in normal range and within 360 days    AST  Date Value Ref Range Status  11/22/2023 50 (H) 10 - 35 U/L Final         Failed - Valid encounter within last 12 months    Recent Outpatient Visits           4 months ago Nausea and vomiting, unspecified vomiting type   [facility] Gareth Mliss [place], FNP              Passed - Cr in normal range and within 360 days    Creat  Date Value Ref Range Status  11/20/2023 1.27 0.70 - 1.30 mg/dL Final         Passed - HCT in normal range and within 360 days    HCT  Date Value Ref Range Status  11/20/2023 46.0 38.5 - 50.0 % Final         Passed - HGB in normal range and within 360 days    Hemoglobin  Date Value Ref Range Status  11/20/2023 15.6 13.2 - 17.1 g/dL Final         Passed - PLT in normal range and within 360 days    Platelets  Date Value Ref Range Status  11/20/2023 283 140 - 400 Thousand/uL Final         Passed - eGFR is 15 or above and within 360 days    GFR, Estimated  Date Value Ref Range Status  09/30/2023 >60 >60 mL/min Final    Comment:    (NOTE) Calculated using the CKD-EPI Creatinine Equation (2021)    eGFR  Date Value Ref Range Status  11/20/2023 67 > OR = 60 mL/min/1.82m2 Final         Passed - Patient is not  pregnant       metoprolol  succinate (TOPROL -XL) 50 MG 24 hr tablet 14 tablet 0    Sig: Take 1 tablet (50 mg total) by mouth daily. Take with or immediately following a meal.     Cardiovascular:  Beta Blockers Failed - 04/15/2024  1:44 PM      Failed - Valid encounter within last 6 months    Recent Outpatient Visits           4 months ago Nausea and vomiting, unspecified vomiting type   [facility] Gareth Mliss [place], [place]              Passed - Last BP in normal range    BP Readings from Last 1 Encounters:  11/20/23 120/84         Passed - Last Heart Rate in normal range  Pulse Readings from Last 1 Encounters:  11/20/23 98          midodrine  (PROAMATINE ) 5 MG tablet 28 tablet 0    Sig: Take 1 tablet (5 mg total) by mouth 2 (two) times daily.     Not Delegated - Cardiovascular: Midodrine  Failed - 04/15/2024  1:44 PM      Failed - This refill cannot be delegated      Failed - ALT in normal range and within 360 days    ALT  Date Value Ref Range Status  11/22/2023 129 (H) 9 - 46 U/L Final         Failed - AST in normal range and within 360 days    AST  Date Value Ref Range Status  11/22/2023 50 (H) 10 - 35 U/L Final         Failed - Valid encounter within last 12 months    Recent Outpatient Visits           4 months ago Nausea and vomiting, unspecified vomiting type   [facility] Gareth Mliss [place], FNP              Passed - Cr in normal range and within 360 days    Creat  Date Value Ref Range Status  11/20/2023 1.27 0.70 - 1.30 mg/dL Final         Passed - Last BP in normal range    BP Readings from Last 1 Encounters:  11/20/23 120/84         Signed Prescriptions Disp Refills   levothyroxine  (SYNTHROID ) 88 MCG tablet 90 tablet 1    Sig: Take 1 tablet (88 mcg total) by mouth daily.     Endocrinology:  Hypothyroid Agents Failed - 04/15/2024  1:44 PM      Failed - TSH in normal range and within 360  days    TSH  Date Value Ref Range Status  11/20/2023 7.22 (H) 0.40 - 4.50 mIU/L Final         Failed - Valid encounter within last 12 months    Recent Outpatient Visits           4 months ago Nausea and vomiting, unspecified vomiting type   [facility] Gareth Mliss F, [place]                 Requested Prescriptions  Pending Prescriptions Disp Refills   rivaroxaban  (XARELTO ) 20 MG TABS tablet 14 tablet 0    Sig: Take 1 tablet (20 mg total) by mouth every morning.     Hematology: Anticoagulants - rivaroxaban  Failed - 04/15/2024  1:44 PM      Failed - ALT in normal range and within 360 days    ALT  Date Value Ref Range Status  11/22/2023 129 (H) 9 - 46 U/L Final         Failed - AST in normal range and within 360 days    AST  Date Value Ref Range Status  11/22/2023 50 (H) 10 - 35 U/L Final         Failed - Valid encounter within last 12 months    Recent Outpatient Visits           4 months ago Nausea and vomiting, unspecified vomiting type   [facility] Gareth Mliss [place], [place]              Passed - Cr in normal range  and within 360 days    Creat  Date Value Ref Range Status  11/20/2023 1.27 0.70 - 1.30 mg/dL Final         Passed - HCT in normal range and within 360 days    HCT  Date Value Ref Range Status  11/20/2023 46.0 38.5 - 50.0 % Final         Passed - HGB in normal range and within 360 days    Hemoglobin  Date Value Ref Range Status  11/20/2023 15.6 13.2 - 17.1 g/dL Final         Passed - PLT in normal range and within 360 days    Platelets  Date Value Ref Range Status  11/20/2023 283 140 - 400 Thousand/uL Final         Passed - eGFR is 15 or above and within 360 days    GFR, Estimated  Date Value Ref Range Status  09/30/2023 >60 >60 mL/min Final    Comment:    (NOTE) Calculated using the CKD-EPI Creatinine Equation (2021)    eGFR  Date Value Ref Range Status  11/20/2023 67 > OR  = 60 mL/min/1.20m2 Final         Passed - Patient is not pregnant       metoprolol  succinate (TOPROL -XL) 50 MG 24 hr tablet 14 tablet 0    Sig: Take 1 tablet (50 mg total) by mouth daily. Take with or immediately following a meal.     Cardiovascular:  Beta Blockers Failed - 04/15/2024  1:44 PM      Failed - Valid encounter within last 6 months    Recent Outpatient Visits           4 months ago Nausea and vomiting, unspecified vomiting type   [facility] Gareth Mliss [place], FNP              Passed - Last BP in normal range    BP Readings from Last 1 Encounters:  11/20/23 120/84         Passed - Last Heart Rate in normal range    Pulse Readings from Last 1 Encounters:  11/20/23 98          midodrine  (PROAMATINE ) 5 MG tablet 28 tablet 0    Sig: Take 1 tablet (5 mg total) by mouth 2 (two) times daily.     Not Delegated - Cardiovascular: Midodrine  Failed - 04/15/2024  1:44 PM      Failed - This refill cannot be delegated      Failed - ALT in normal range and within 360 days    ALT  Date Value Ref Range Status  11/22/2023 129 (H) 9 - 46 U/L Final         Failed - AST in normal range and within 360 days    AST  Date Value Ref Range Status  11/22/2023 50 (H) 10 - 35 U/L Final         Failed - Valid encounter within last 12 months    Recent Outpatient Visits           4 months ago Nausea and vomiting, unspecified vomiting type   [facility] Gareth Mliss [place], [place]              Passed - Cr in normal range and within 360 days    Creat  Date Value Ref Range Status  11/20/2023 1.27 0.70 - 1.30 mg/dL Final  Passed - Last BP in normal range    BP Readings from Last 1 Encounters:  11/20/23 120/84         Signed Prescriptions Disp Refills   levothyroxine  (SYNTHROID ) 88 MCG tablet 90 tablet 1    Sig: Take 1 tablet (88 mcg total) by mouth daily.     Endocrinology:  Hypothyroid Agents Failed - 04/15/2024   1:44 PM      Failed - TSH in normal range and within 360 days    TSH  Date Value Ref Range Status  11/20/2023 7.22 (H) 0.40 - 4.50 mIU/L Final         Failed - Valid encounter within last 12 months    Recent Outpatient Visits           4 months ago Nausea and vomiting, unspecified vomiting type   [facility] Gareth Mliss [place], [place]

## 2024-04-15 NOTE — Telephone Encounter (Signed)
 Requested Prescriptions  Pending Prescriptions Disp Refills   levothyroxine  (SYNTHROID ) 88 MCG tablet 90 tablet 1    Sig: Take 1 tablet (88 mcg total) by mouth daily.     Endocrinology:  Hypothyroid Agents Failed - 04/15/2024  1:43 PM      Failed - TSH in normal range and within 360 days    TSH  Date Value Ref Range Status  11/20/2023 7.22 (H) 0.40 - 4.50 mIU/L Final         Failed - Valid encounter within last 12 months    Recent Outpatient Visits           4 months ago Nausea and vomiting, unspecified vomiting type   [facility] Gareth Mliss [place], FNP               rivaroxaban  (XARELTO ) 20 MG TABS tablet 14 tablet 0    Sig: Take 1 tablet (20 mg total) by mouth every morning.     Hematology: Anticoagulants - rivaroxaban  Failed - 04/15/2024  1:43 PM      Failed - ALT in normal range and within 360 days    ALT  Date Value Ref Range Status  11/22/2023 129 (H) 9 - 46 U/L Final         Failed - AST in normal range and within 360 days    AST  Date Value Ref Range Status  11/22/2023 50 (H) 10 - 35 U/L Final         Failed - Valid encounter within last 12 months    Recent Outpatient Visits           4 months ago Nausea and vomiting, unspecified vomiting type   [facility] Gareth Mliss [place], FNP              Passed - Cr in normal range and within 360 days    Creat  Date Value Ref Range Status  11/20/2023 1.27 0.70 - 1.30 mg/dL Final         Passed - HCT in normal range and within 360 days    HCT  Date Value Ref Range Status  11/20/2023 46.0 38.5 - 50.0 % Final         Passed - HGB in normal range and within 360 days    Hemoglobin  Date Value Ref Range Status  11/20/2023 15.6 13.2 - 17.1 g/dL Final         Passed - PLT in normal range and within 360 days    Platelets  Date Value Ref Range Status  11/20/2023 283 140 - 400 Thousand/uL Final         Passed - eGFR is 15 or above and within 360 days     GFR, Estimated  Date Value Ref Range Status  09/30/2023 >60 >60 mL/min Final    Comment:    (NOTE) Calculated using the CKD-EPI Creatinine Equation (2021)    eGFR  Date Value Ref Range Status  11/20/2023 67 > OR = 60 mL/min/1.51m2 Final         Passed - Patient is not pregnant       metoprolol  succinate (TOPROL -XL) 50 MG 24 hr tablet 14 tablet 0    Sig: Take 1 tablet (50 mg total) by mouth daily. Take with or immediately following a meal.     Cardiovascular:  Beta Blockers Failed - 04/15/2024  1:43 PM      Failed - Valid encounter within last  6 months    Recent Outpatient Visits           4 months ago Nausea and vomiting, unspecified vomiting type   [facility] Gareth Mliss [place], [place]              Passed - Last BP in normal range    BP Readings from Last 1 Encounters:  11/20/23 120/84         Passed - Last Heart Rate in normal range    Pulse Readings from Last 1 Encounters:  11/20/23 98          midodrine  (PROAMATINE ) 5 MG tablet 28 tablet 0    Sig: Take 1 tablet (5 mg total) by mouth 2 (two) times daily.     Not Delegated - Cardiovascular: Midodrine  Failed - 04/15/2024  1:43 PM      Failed - This refill cannot be delegated      Failed - ALT in normal range and within 360 days    ALT  Date Value Ref Range Status  11/22/2023 129 (H) 9 - 46 U/L Final         Failed - AST in normal range and within 360 days    AST  Date Value Ref Range Status  11/22/2023 50 (H) 10 - 35 U/L Final         Failed - Valid encounter within last 12 months    Recent Outpatient Visits           4 months ago Nausea and vomiting, unspecified vomiting type   [facility] Gareth Mliss [place], FNP              Passed - Cr in normal range and within 360 days    Creat  Date Value Ref Range Status  11/20/2023 1.27 0.70 - 1.30 mg/dL Final         Passed - Last BP in normal range    BP Readings from Last 1 Encounters:  11/20/23  120/84

## 2024-04-16 MED ORDER — METOPROLOL SUCCINATE ER 50 MG PO TB24: 50.0000 mg | ORAL_TABLET | Freq: Every day | ORAL | 0 refills | Status: DC

## 2024-04-16 MED ORDER — MIDODRINE HCL 5 MG PO TABS: 5.0000 mg | ORAL_TABLET | Freq: Two times a day (BID) | ORAL | 0 refills | Status: DC

## 2024-04-16 MED ORDER — RIVAROXABAN 20 MG PO TABS: 20.0000 mg | ORAL_TABLET | Freq: Every morning | ORAL | 0 refills | Status: DC

## 2024-04-17 ENCOUNTER — Encounter: Payer: Self-pay | Admitting: Nurse Practitioner

## 2024-04-30 ENCOUNTER — Ambulatory Visit: Admitting: Nurse Practitioner

## 2024-04-30 VITALS — BP 122/78 | HR 90 | Temp 98.8°F | Resp 18 | Ht 77.0 in | Wt 258.2 lb

## 2024-04-30 DIAGNOSIS — E039 Hypothyroidism, unspecified: Secondary | ICD-10-CM | POA: Diagnosis not present

## 2024-04-30 DIAGNOSIS — I5030 Unspecified diastolic (congestive) heart failure: Secondary | ICD-10-CM

## 2024-04-30 DIAGNOSIS — K760 Fatty (change of) liver, not elsewhere classified: Secondary | ICD-10-CM | POA: Diagnosis not present

## 2024-04-30 DIAGNOSIS — Z0001 Encounter for general adult medical examination with abnormal findings: Secondary | ICD-10-CM

## 2024-04-30 DIAGNOSIS — Z0000 Encounter for general adult medical examination without abnormal findings: Secondary | ICD-10-CM

## 2024-04-30 DIAGNOSIS — Z1211 Encounter for screening for malignant neoplasm of colon: Secondary | ICD-10-CM

## 2024-04-30 DIAGNOSIS — F319 Bipolar disorder, unspecified: Secondary | ICD-10-CM

## 2024-04-30 DIAGNOSIS — I429 Cardiomyopathy, unspecified: Secondary | ICD-10-CM | POA: Diagnosis not present

## 2024-04-30 DIAGNOSIS — F250 Schizoaffective disorder, bipolar type: Secondary | ICD-10-CM

## 2024-04-30 DIAGNOSIS — Z125 Encounter for screening for malignant neoplasm of prostate: Secondary | ICD-10-CM

## 2024-04-30 DIAGNOSIS — Z23 Encounter for immunization: Secondary | ICD-10-CM

## 2024-04-30 DIAGNOSIS — Z131 Encounter for screening for diabetes mellitus: Secondary | ICD-10-CM

## 2024-04-30 DIAGNOSIS — Z13220 Encounter for screening for lipoid disorders: Secondary | ICD-10-CM

## 2024-04-30 DIAGNOSIS — I4811 Longstanding persistent atrial fibrillation: Secondary | ICD-10-CM

## 2024-04-30 DIAGNOSIS — I10 Essential (primary) hypertension: Secondary | ICD-10-CM | POA: Diagnosis not present

## 2024-04-30 DIAGNOSIS — Z130 Encounter for screening for diseases of the blood and blood-forming organs and certain disorders involving the immune mechanism: Secondary | ICD-10-CM

## 2024-04-30 DIAGNOSIS — R2241 Localized swelling, mass and lump, right lower limb: Secondary | ICD-10-CM

## 2024-04-30 MED ORDER — METOPROLOL SUCCINATE ER 50 MG PO TB24: 50.0000 mg | ORAL_TABLET | Freq: Every day | ORAL | 1 refills | Status: AC

## 2024-04-30 MED ORDER — MIDODRINE HCL 5 MG PO TABS: 5.0000 mg | ORAL_TABLET | Freq: Two times a day (BID) | ORAL | 1 refills | Status: AC

## 2024-04-30 MED ORDER — RIVAROXABAN 20 MG PO TABS: 20.0000 mg | ORAL_TABLET | Freq: Every morning | ORAL | 1 refills | Status: AC

## 2024-04-30 NOTE — Progress Notes (Signed)
 Name: Curtis Hodges   MRN: [id number]    DOB: 06-Jun-1971   Date:04/30/2024       Progress Note  Subjective  Chief Complaint  Chief Complaint  Patient presents with   Annual Exam   Cyst    Knot on side of right foot for a while denies pain    HPI  Patient presents for annual CPE . Discussed the use of AI scribe software for clinical note transcription with the patient, who gave verbal consent to proceed.  History of Present Illness Curtis Hodges is a 53 year old male who presents for an annual physical exam.  Thyroid dysfunction - Currently taking levothyroxine  88 micrograms daily for hypothyroidism - Recent TSH level 7.22 - Adheres to medication regimen, taking it every morning  Hypertension - Taking metoprolol  50 milligrams daily  Atrial fibrillation and anticoagulation - Taking Xarelto  20 milligrams daily for atrial fibrillation  Right foot mass ?lipoma - plantar aspect of right foot - Swelling described as sometimes 'big' - No associated pain - Duration of swelling is unclear  Adrenal nodule surveillance - Adrenal nodule identified prior to 2022 at [facility], measured 1.2 cm at that time - Measured 1.1 by 1.2 cm in January 2025 - No imaging since January 2025 - Interest in re-evaluation of adrenal nodule  Lower urinary tract symptoms - No sensation of incomplete bladder emptying, urgency, or incontinence - Nocturia once per night  Lifestyle and preventive health - Maintains a well-balanced diet - Engages in daily walking at work - Owns an exercise bike but does not use it regularly - Sleep described as 'pretty good' - Plans to call dentist in the spring for appointment - Last eye exam one year ago   09/30/2023 EXAM: CT ANGIOGRAPHY CHEST   CT ABDOMEN AND PELVIS WITH CONTRAST   TECHNIQUE: Multidetector CT imaging of the chest was performed using the standard protocol during bolus administration of intravenous contrast. Multiplanar CT image  reconstructions and MIPs were obtained to evaluate the vascular anatomy. Multidetector CT imaging of the abdomen and pelvis was performed using the standard protocol during bolus administration of intravenous contrast.   RADIATION DOSE REDUCTION: This exam was performed according to the departmental dose-optimization program which includes automated exposure control, adjustment of the mA and/or kV according to patient size and/or use of iterative reconstruction technique.   CONTRAST:  OMNIPAQUE  IOHEXOL  350 MG/ML SOLN   COMPARISON:  None Available.   FINDINGS: CTA CHEST FINDINGS   Cardiovascular: No evidence of embolism to the proximal subsegmental pulmonary artery level.   Normal cardiac size. No pericardial effusion. No aortic aneurysm.   Mediastinum/Nodes: Visualized thyroid gland appears grossly unremarkable. No solid / cystic mediastinal masses. The esophagus is nondistended precluding optimal assessment. No axillary, mediastinal or hilar lymphadenopathy by size criteria.   Lungs/Pleura: The central tracheo-bronchial tree is patent. There are innumerable tree-in-bud configuration solid, micro nodules (less than 3 mm), in the right lung lower lobe. This is nonspecific and differential diagnosis includes aspiration, infection, bronchiolitis, etc. No mass or consolidation. No pleural effusion or pneumothorax.   Musculoskeletal: The visualized soft tissues of the chest wall are grossly unremarkable. No suspicious osseous lesions. There are mild multilevel degenerative changes in the visualized spine.   Review of the MIP images confirms the above findings.   CT ABDOMEN and PELVIS FINDINGS   Hepatobiliary: The liver is normal in size. Non-cirrhotic configuration. No suspicious mass. No intrahepatic or extrahepatic bile duct dilation. Gallbladder is surgically absent.  Pancreas: Unremarkable. No pancreatic ductal dilatation or surrounding inflammatory changes.    Spleen: Within normal limits. No focal lesion.   Adrenals/Urinary Tract: There is an indeterminate 1.1 x 1.2 cm right adrenal nodule, which can not be characterized as an adenoma. Unremarkable left adrenal nodule. No suspicious renal mass. There are subcentimeter sized hypoattenuating foci in bilateral kidneys, which are too small to adequately characterize. No hydronephrosis. No renal or ureteric calculi. Unremarkable urinary bladder.   Stomach/Bowel: No disproportionate dilation of the small or large bowel loops. No evidence of abnormal bowel wall thickening or inflammatory changes. The appendix is unremarkable.   Vascular/Lymphatic: No ascites or pneumoperitoneum. No abdominal or pelvic lymphadenopathy, by size criteria. No aneurysmal dilation of the major abdominal arteries.   Reproductive: Normal size prostate. Symmetric seminal vesicles.   Other: There is a tiny fat containing umbilical hernia. The soft tissues and abdominal wall are otherwise unremarkable.   Musculoskeletal: No suspicious osseous lesions. There are mild multilevel degenerative changes in the visualized spine.   Review of the MIP images confirms the above findings.   IMPRESSION: 1. No embolism to the proximal subsegmental pulmonary artery level. 2. There are multiple, tree-in-bud configuration solid micro nodules in the right lung lower lobe. This is nonspecific and differential diagnosis includes aspiration, infection, bronchiolitis, etc. 3. No acute inflammatory process identified within the abdomen or pelvis. 4. Indeterminate 1.1 x 1.2 cm right adrenal nodule. Consider 12 month follow-up adrenal protocol CT scan. 5. Multiple other nonacute observations, as described above.  01/17/2021 CT abdomen and pelvis with IV contrast   Comparison:  12/30/2020.   Indication:  Bowel obstruction suspected, evaluate sbo, R11.2 Nausea with  vomiting, unspecified.   Technique:  CT imaging was performed of the  abdomen and pelvis following  the administration of intravenous contrast.  Iodinated contrast was used  due to the indications for the examination, to improve disease detection  and further define anatomy. Coronal and sagittal reformatted images were  generated and reviewed.   Findings:  - Lower Thorax: Please see same-day dedicated CT chest for additional  details on findings above the diaphragm.   - Liver: Normal in morphology. Mild periportal edema. No suspicious hepatic  masses are identified.    - Biliary and Gallbladder: No intrahepatic or extrahepatic bile duct  dilatation. The gallbladder is surgically absent.   - Spleen: Normal in appearance.    - Pancreas: Normal in appearance.   - Adrenal Glands: Unchanged 1.2 cm indeterminate right adrenal gland  nodule.   - Kidneys: Symmetric in enhancement. No suspicious renal lesions. No  hydronephrosis.   - Abdominal and Pelvic Vasculature: No abdominal aortic aneurysm.  Replaced  right hepatic artery from the SMA and left hepatic artery from the left  gastric artery. The SMA is patent proximally.   - Gastrointestinal Tract: No bowel dilatation. No pneumatosis identified  currently.   - Peritoneum/Mesentery/Retroperitoneum: No free fluid.  No free  intraperitoneal air.    - Lymph Nodes: No retroperitoneal, mesenteric, pelvic, or inguinal  lymphadenopathy.    - Bladder: Normal in appearance.   - Pelvic Organs: Unremarkable.   - Body Wall: Unremarkable.   - Musculoskeletal:  No aggressive appearing osseous lesions.    Impression:  1. No small bowel obstruction. No acute findings in the abdomen or pelvis.  2.  Stable indeterminate right adrenal nodule. A nonurgent adrenal protocol  CT or MRI is recommended for further evaluation.   Please see same-day dedicated CT chest for additional details on findings  above the diaphragm.   IPSS     Row Name 04/30/24 9160         International Prostate Symptom Score   How  often have you had the sensation of not emptying your bladder? Not at All     How often have you had to urinate less than every two hours? Not at All     How often have you found you stopped and started again several times when you urinated? Not at All     How often have you found it difficult to postpone urination? Not at All     How often have you had a weak urinary stream? Not at All     How often have you had to strain to start urination? Not at All     How many times did you typically get up at night to urinate? 1 Time     Total IPSS Score 1       Quality of Life due to urinary symptoms   If you were to spend the rest of your life with your urinary condition just the way it is now how would you feel about that? Delighted            Diet: tries to eat well balanced Exercise: walks daily at work Sleep: 6-8 hours sleeps pretty good Last dental exam:needs to schedule Last eye exam: 1 year ago  Depression: phq 9 is negative    04/30/2024    8:35 AM 11/20/2023    9:04 AM 08/22/2022   11:06 AM 07/23/2022    9:46 AM 09/20/2021   10:43 AM  Depression screen PHQ 2/9  Decreased Interest 0 0 0 0 0  Down, Depressed, Hopeless 0 0 0 0 0  PHQ - 2 Score 0 0 0 0 0  Altered sleeping 0 0 0 0 0  Tired, decreased energy 0 0 0 0 0  Change in appetite 0 0 0 0 0  Feeling bad or failure about yourself  0 0 0 0 0  Trouble concentrating 0 0 0 0 0  Moving slowly or fidgety/restless 0 0 0 0 0  Suicidal thoughts 0 0 0 0 0  PHQ-9 Score 0 0 0 0 0  Difficult doing work/chores Not difficult at all Not difficult at all Not difficult at all Not difficult at all Not difficult at all    Hypertension:  BP Readings from Last 3 Encounters:  04/30/24 122/78  11/20/23 120/84  09/30/23 125/80    Obesity: Wt Readings from Last 3 Encounters:  04/30/24 258 lb 3.2 oz (117.1 kg)  11/20/23 253 lb (114.8 kg)  09/30/23 245 lb (111.1 kg)   BMI Readings from Last 3 Encounters:  04/30/24 30.62 kg/m  11/20/23 30.00  kg/m  09/30/23 29.05 kg/m    Flowsheet Row [facility] Visit from 04/30/2024 in [facility]  1 48 inches     Lipids:  Lab Results  Component Value Date   CHOL 116 11/20/2023   CHOL 129 11/17/2022   CHOL 155 07/23/2022   Lab Results  Component Value Date   HDL 52 11/20/2023   HDL 65 11/17/2022   HDL 53 07/23/2022   Lab Results  Component Value Date   LDLCALC 46 11/20/2023   LDLCALC 48 11/17/2022   LDLCALC 87 07/23/2022   Lab Results  Component Value Date   TRIG 92 11/20/2023   TRIG 81 11/17/2022   TRIG 67 07/23/2022   Lab Results  Component  Value Date   CHOLHDL 2.2 11/20/2023   CHOLHDL 2.0 11/17/2022   CHOLHDL 2.9 07/23/2022   No results found for: LDLDIRECT Glucose:  Glucose, Bld  Date Value Ref Range Status  11/20/2023 135 (H) 65 - 99 mg/dL Final    Comment:    .            Fasting reference interval . For someone without known diabetes, a glucose value >125 mg/dL indicates that they may have diabetes and this should be confirmed with a follow-up test. .   09/30/2023 106 (H) 70 - 99 mg/dL Final    Comment:    Glucose reference range applies only to samples taken after fasting for at least 8 hours.  11/14/2022 124 (H) 70 - 99 mg/dL Final    Comment:    Glucose reference range applies only to samples taken after fasting for at least 8 hours.    Flowsheet Row Office Visit from 07/23/2022 in [facility]  AUDIT-C Score 0     Single STD testing and prevention (HIV/chl/gon/syphilis): completed Hep C: completed  Skin cancer: Discussed monitoring for atypical lesions Colorectal cancer: due Prostate cancer: due Lab Results  Component Value Date   PSA 0.46 07/23/2022     Lung cancer:   Low Dose CT Chest recommended if Age 38-80 years, 30 pack-year currently smoking OR have quit w/in 15years. Patient does not qualify.   AAA:  The USPSTF recommends one-time screening with ultrasonography in men ages  65 to 55 years who have ever smoked ECG:  11/19/2022  Vaccines:  HPV: up to at age 19 , ask insurance if age between 39-45  Shingrix: 36-64 yo and ask insurance if covered when patient above 70 yo Pneumonia:  educated and discussed with patient. Flu:  educated and discussed with patient.  Advanced Care Planning: A voluntary discussion about advance care planning including the explanation and discussion of advance directives.  Discussed health care proxy and Living will, and the patient was able to identify a health care proxy as sister.  Patient does not have a living will at present time. If patient does have living will, I have requested they bring this to the clinic to be scanned in to their chart.  Patient Active Problem List   Diagnosis Date Noted   Bipolar 1 disorder (HCC) 11/15/2022   Disruptive mood dysregulation disorder (HCC) 11/15/2022   Schizoaffective disorder, bipolar type (HCC) 11/15/2022   Lymphedema 09/29/2022   Cellulitis of foot, left 08/11/2021   Bilateral edema of lower extremity 05/04/2021   Adrenal nodule (HCC) 05/04/2021   Onychodystrophy 05/04/2021   Longstanding persistent atrial fibrillation (HCC) 04/25/2021   Medication management 02/14/2021   Primary hypertension 02/03/2021   Bradycardia, unspecified 01/20/2021   Swelling of toe of right foot 01/09/2021   NAFL (nonalcoholic fatty liver) 12/27/2020   Bipolar disorder, unspecified (HCC) 09/17/2020   Hypothyroidism, unspecified 09/17/2020   Personal history of pulmonary embolism 09/17/2020   Eosinophilia, unspecified 09/17/2020   Anemia, unspecified 09/17/2020   Cardiomyopathy, unspecified (HCC) 09/17/2020   Diastolic heart failure (HCC) 09/17/2020   Hypo-osmolality and hyponatremia 09/17/2020   Long term (current) use of anticoagulants 09/17/2020    Past Surgical History:  Procedure Laterality Date   CHOLECYSTECTOMY     GALLBLADDER SURGERY      Family History  Problem Relation Age of Onset   COPD  Mother    Atrial fibrillation Mother    Brain cancer Father     Social History  Socioeconomic History   Marital status: Single    Spouse name: Not on file   Number of children: Not on file   Years of education: Not on file   Highest education level: Bachelor's degree (e.g., BA, AB, BS)  Occupational History   Not on file  Tobacco Use   Smoking status: Never   Smokeless tobacco: Never  Vaping Use   Vaping status: Never Used  Substance and Sexual Activity   Alcohol use: Not Currently   Drug use: Never   Sexual activity: Not Currently  Other Topics Concern   Not on file  Social History Narrative   Not on file   Social Drivers of Health   Financial Resource Strain: Low Risk  (04/30/2024)   Overall Financial Resource Strain (CARDIA)    Difficulty of Paying Living Expenses: Not hard at all  Food Insecurity: No Food Insecurity (04/30/2024)   Hunger Vital Sign    Worried About Running Out of Food in the Last Year: Never true    Ran Out of Food in the Last Year: Never true  Transportation Needs: No Transportation Needs (04/30/2024)   PRAPARE - Administrator, Civil Service (Medical): No    Lack of Transportation (Non-Medical): No  Physical Activity: Unknown (04/30/2024)   Exercise Vital Sign    Days of Exercise per Week: 5 days    Minutes of Exercise per Session: Patient declined  Stress: No Stress Concern Present (04/30/2024)   [employer] of Occupational Health - Occupational Stress Questionnaire    Feeling of Stress: Not at all  Social Connections: Socially Isolated (04/30/2024)   Social Connection and Isolation Panel    Frequency of Communication with Friends and Family: More than three times a week    Frequency of Social Gatherings with Friends and Family: Once a week    Attends Religious Services: Never    Database administrator or Organizations: No    Attends Engineer, structural: Not on file    Marital Status: Never married  Intimate  Partner Violence: Not At Risk (11/15/2022)   Humiliation, Afraid, Rape, and Kick questionnaire    Fear of Current or Ex-Partner: No    Emotionally Abused: No    Physically Abused: No    Sexually Abused: No     Current Outpatient Medications:    busPIRone  (BUSPAR ) 15 MG tablet, Take 1 tablet (15 mg total) by mouth 2 (two) times daily., Disp: 28 tablet, Rfl: 0   levothyroxine  (SYNTHROID ) 88 MCG tablet, Take 1 tablet (88 mcg total) by mouth daily., Disp: 90 tablet, Rfl: 1   lithium  carbonate (ESKALITH ) 450 MG ER tablet, Take 1 tablet (450 mg total) by mouth every 12 (twelve) hours., Disp: 28 tablet, Rfl: 0   risperiDONE  (RISPERDAL ) 1 MG tablet, Take 1.5 tablets (1.5 mg total) by mouth at bedtime., Disp: 21 tablet, Rfl: 0   temazepam  (RESTORIL ) 15 MG capsule, Take 1 capsule (15 mg total) by mouth at bedtime as needed for sleep., Disp: 30 capsule, Rfl: 1   metoprolol  succinate (TOPROL -XL) 50 MG 24 hr tablet, Take 1 tablet (50 mg total) by mouth daily. Take with or immediately following a meal., Disp: 90 tablet, Rfl: 1   midodrine  (PROAMATINE ) 5 MG tablet, Take 1 tablet (5 mg total) by mouth 2 (two) times daily., Disp: 90 tablet, Rfl: 1   rivaroxaban  (XARELTO ) 20 MG TABS tablet, Take 1 tablet (20 mg total) by mouth every morning., Disp: 90 tablet, Rfl: 1  Allergies  Allergen Reactions   Cat Dander      ROS  Constitutional: Negative for fever or weight change.  Respiratory: Negative for cough and shortness of breath.   Cardiovascular: Negative for chest pain or palpitations.  Gastrointestinal: Negative for abdominal pain, no bowel changes.  Musculoskeletal: Negative for gait problem or joint swelling.  Skin: Negative for rash.  Neurological: Negative for dizziness or headache.  No other specific complaints in a complete review of systems (except as listed in HPI above).    Objective  Vitals:   04/30/24 0831  BP: 122/78  Pulse: 90  Resp: 18  Temp: 98.8 F (37.1 C)  SpO2: 99%   Weight: 258 lb 3.2 oz (117.1 kg)  Height: 6' 5 (1.956 m)    Body mass index is 30.62 kg/m.  Physical Exam Vitals reviewed.  Constitutional:      Appearance: Normal appearance.  HENT:     Head: Normocephalic.     Right Ear: Tympanic membrane normal.     Left Ear: Tympanic membrane normal.     Nose: Nose normal.  Eyes:     Extraocular Movements: Extraocular movements intact.     Conjunctiva/sclera: Conjunctivae normal.     Pupils: Pupils are equal, round, and reactive to light.  Neck:     Thyroid: No thyroid mass, thyromegaly or thyroid tenderness.  Cardiovascular:     Rate and Rhythm: Normal rate and regular rhythm.     Pulses: Normal pulses.     Heart sounds: Normal heart sounds.  Pulmonary:     Effort: Pulmonary effort is normal.     Breath sounds: Normal breath sounds.  Abdominal:     General: Bowel sounds are normal.     Palpations: Abdomen is soft.  Musculoskeletal:        General: Normal range of motion.     Cervical back: Normal range of motion and neck supple.     Right lower leg: No edema.     Left lower leg: No edema.  Skin:    General: Skin is warm and dry.     Capillary Refill: Capillary refill takes less than 2 seconds.  Neurological:     General: No focal deficit present.     Mental Status: He is alert and oriented to person, place, and time. Mental status is at baseline.  Psychiatric:        Mood and Affect: Mood normal.        Behavior: Behavior normal.        Thought Content: Thought content normal.        Judgment: Judgment normal.      No results found for this or any previous visit (from the past 2160 hours).   Fall Risk:    04/30/2024    8:35 AM 11/20/2023    8:49 AM 08/22/2022   11:06 AM 07/23/2022    9:45 AM 09/20/2021   10:42 AM  Fall Risk   Falls in the past year? 0 0 0 1 0  Number falls in past yr: 0 0 0 1 0  Injury with Fall? 0 0 0 0 0  Risk for fall due to :   No Fall Risks History of fall(s) No Fall Risks  Follow up Falls  evaluation completed Falls evaluation completed Falls prevention discussed;Education provided;Falls evaluation completed  Falls evaluation completed  Falls prevention discussed      Data saved with a previous flowsheet row definition      Functional Status Survey: Is the  patient deaf or have difficulty hearing?: No Does the patient have difficulty seeing, even when wearing glasses/contacts?: No Does the patient have difficulty concentrating, remembering, or making decisions?: No Does the patient have difficulty walking or climbing stairs?: No Does the patient have difficulty dressing or bathing?: No Does the patient have difficulty doing errands alone such as visiting a doctor's office or shopping?: No    Assessment & Plan  Problem List Items Addressed This Visit       Cardiovascular and Mediastinum   Cardiomyopathy, unspecified (HCC)   Relevant Medications   metoprolol  succinate (TOPROL -XL) 50 MG 24 hr tablet   midodrine  (PROAMATINE ) 5 MG tablet   rivaroxaban  (XARELTO ) 20 MG TABS tablet   Other Relevant Orders   Lipid panel   Ambulatory referral to Cardiology   Diastolic heart failure (HCC)   Relevant Medications   metoprolol  succinate (TOPROL -XL) 50 MG 24 hr tablet   midodrine  (PROAMATINE ) 5 MG tablet   rivaroxaban  (XARELTO ) 20 MG TABS tablet   Other Relevant Orders   Ambulatory referral to Cardiology   Primary hypertension   Relevant Medications   metoprolol  succinate (TOPROL -XL) 50 MG 24 hr tablet   midodrine  (PROAMATINE ) 5 MG tablet   rivaroxaban  (XARELTO ) 20 MG TABS tablet   Other Relevant Orders   CBC with Differential/Platelet   Comprehensive metabolic panel with GFR   Ambulatory referral to Cardiology   Longstanding persistent atrial fibrillation (HCC)   Relevant Medications   metoprolol  succinate (TOPROL -XL) 50 MG 24 hr tablet   midodrine  (PROAMATINE ) 5 MG tablet   rivaroxaban  (XARELTO ) 20 MG TABS tablet     Digestive   NAFL (nonalcoholic fatty liver)    Relevant Orders   Comprehensive metabolic panel with GFR     Endocrine   Hypothyroidism, unspecified   Relevant Medications   metoprolol  succinate (TOPROL -XL) 50 MG 24 hr tablet   Other Relevant Orders   TSH     Other   Bipolar 1 disorder (HCC)   Schizoaffective disorder, bipolar type (HCC)   Other Visit Diagnoses       Annual physical exam    -  Primary   Relevant Orders   CBC with Differential/Platelet   Comprehensive metabolic panel with GFR   Lipid panel   Hemoglobin A1c   TSH   PSA     Screening for colon cancer       Relevant Orders   Cologuard     Screening for prostate cancer       Relevant Orders   PSA     Screening for deficiency anemia       Relevant Orders   CBC with Differential/Platelet     Screening for cholesterol level       Relevant Orders   Lipid panel     Screening for diabetes mellitus       Relevant Orders   Comprehensive metabolic panel with GFR   Hemoglobin A1c     Mass of foot, right       Relevant Orders   Ambulatory referral to Podiatry     Need for influenza vaccination       Relevant Orders   Pneumococcal conjugate vaccine 20-valent (Prevnar 20) (Completed)      Assessment and Plan Assessment & Plan Adult Wellness Visit Annual physical examination conducted. Blood pressure is well-controlled at 122/78 mmHg. Prostate screening questions indicate no urinary issues. - Administer pneumonia vaccination - Encourage balanced diet and regular exercise - Advise follow-up with dentist in spring  Hypothyroidism TSH level elevated at 7.22. Currently on levothyroxine  88 mcg daily. - Recheck TSH levels - Continue levothyroxine  88 mcg daily  Hypertension Blood pressure is well-controlled at 122/78 mmHg. Currently on metoprolol  50 mg daily. - Continue metoprolol  50 mg daily  Cardiomyopathy and heart failure Ongoing management required. - Refer to cardiologist in [facility]  Atrial fibrillation Managed with Xarelto  20 mg daily. -  Continue Xarelto  20 mg daily - Refill Xarelto  prescription  Right foot swelling Intermittent swelling without pain. - Refer to podiatrist for evaluation  Adrenal nodule, stable Adrenal nodule measuring 1.1 x 1.2 cm, unchanged since May 2022 and January 2025. - Schedule adrenal protocol CT scan in January 2026     -Prostate cancer screening and PSA options (with potential risks and benefits of testing vs not testing) were discussed along with recent recs/guidelines. -USPSTF grade A and B recommendations reviewed with patient; age-appropriate recommendations, preventive care, screening tests, etc discussed and encouraged; healthy living encouraged; see AVS for patient education given to patient -Discussed importance of 150 minutes of physical activity weekly, eat two servings of fish weekly, eat one serving of tree nuts ( cashews, pistachios, pecans, almonds.SABRA) every other day, eat 6 servings of fruit/vegetables daily and drink plenty of water and avoid sweet beverages.  -Reviewed Health Maintenance: yes

## 2024-05-01 ENCOUNTER — Other Ambulatory Visit: Payer: Self-pay | Admitting: Nurse Practitioner

## 2024-05-01 ENCOUNTER — Ambulatory Visit: Payer: Self-pay | Admitting: Nurse Practitioner

## 2024-05-01 DIAGNOSIS — E039 Hypothyroidism, unspecified: Secondary | ICD-10-CM

## 2024-05-01 LAB — CBC WITH DIFFERENTIAL/PLATELET
Absolute Lymphocytes: 3097 {cells}/uL (ref 850–3900)
Absolute Monocytes: 884 {cells}/uL (ref 200–950)
Basophils Absolute: 56 {cells}/uL (ref 0–200)
Basophils Relative: 0.6 %
Eosinophils Absolute: 419 {cells}/uL (ref 15–500)
Eosinophils Relative: 4.5 %
HCT: 43.3 % (ref 38.5–50.0)
Hemoglobin: 15.4 g/dL (ref 13.2–17.1)
MCH: 34.2 pg — ABNORMAL HIGH (ref 27.0–33.0)
MCHC: 35.6 g/dL (ref 32.0–36.0)
MCV: 96.2 fL (ref 80.0–100.0)
MPV: 9.5 fL (ref 7.5–12.5)
Monocytes Relative: 9.5 %
Neutro Abs: 4845 {cells}/uL (ref 1500–7800)
Neutrophils Relative %: 52.1 %
Platelets: 349 Thousand/uL (ref 140–400)
RBC: 4.5 Million/uL (ref 4.20–5.80)
RDW: 12.4 % (ref 11.0–15.0)
Total Lymphocyte: 33.3 %
WBC: 9.3 Thousand/uL (ref 3.8–10.8)

## 2024-05-01 LAB — COMPREHENSIVE METABOLIC PANEL WITH GFR
AG Ratio: 1.5 (calc) (ref 1.0–2.5)
ALT: 24 U/L (ref 9–46)
AST: 27 U/L (ref 10–35)
Albumin: 4.4 g/dL (ref 3.6–5.1)
Alkaline phosphatase (APISO): 107 U/L (ref 35–144)
BUN: 22 mg/dL (ref 7–25)
CO2: 29 mmol/L (ref 20–32)
Calcium: 10.1 mg/dL (ref 8.6–10.3)
Chloride: 100 mmol/L (ref 98–110)
Creat: 0.95 mg/dL (ref 0.70–1.30)
Globulin: 2.9 g/dL (ref 1.9–3.7)
Glucose, Bld: 83 mg/dL (ref 65–99)
Potassium: 4.4 mmol/L (ref 3.5–5.3)
Sodium: 136 mmol/L (ref 135–146)
Total Bilirubin: 0.5 mg/dL (ref 0.2–1.2)
Total Protein: 7.3 g/dL (ref 6.1–8.1)
eGFR: 95 mL/min/1.73m2 (ref >=60)

## 2024-05-01 LAB — TSH: TSH: 5.36 m[IU]/L — ABNORMAL HIGH (ref 0.40–4.50)

## 2024-05-01 LAB — HEMOGLOBIN A1C
Hgb A1c MFr Bld: 5.7 % — ABNORMAL HIGH (ref <5.7)
Mean Plasma Glucose: 117 mg/dL
eAG (mmol/L): 6.5 mmol/L

## 2024-05-01 LAB — PSA: PSA: 0.74 ng/mL (ref <=4.00)

## 2024-05-01 LAB — LIPID PANEL
Cholesterol: 140 mg/dL (ref <200)
HDL: 52 mg/dL (ref >=40)
LDL Cholesterol (Calc): 70 mg/dL
Non-HDL Cholesterol (Calc): 88 mg/dL (ref <130)
Total CHOL/HDL Ratio: 2.7 (calc) (ref <5.0)
Triglycerides: 97 mg/dL (ref <150)

## 2024-05-01 MED ORDER — LEVOTHYROXINE SODIUM 100 MCG PO TABS: 100.0000 ug | ORAL_TABLET | Freq: Every day | ORAL | 0 refills | Status: DC

## 2024-05-07 ENCOUNTER — Ambulatory Visit: Admitting: Podiatry

## 2024-05-07 ENCOUNTER — Encounter: Payer: Self-pay | Admitting: Podiatry

## 2024-05-07 VITALS — Ht 77.0 in | Wt 258.2 lb

## 2024-05-07 DIAGNOSIS — M7989 Other specified soft tissue disorders: Secondary | ICD-10-CM

## 2024-05-07 DIAGNOSIS — Q666 Other congenital valgus deformities of feet: Secondary | ICD-10-CM | POA: Diagnosis not present

## 2024-05-07 NOTE — Progress Notes (Signed)
 Subjective:  Patient ID: Curtis Hodges, male    DOB: [birth year],  MRN: [id number]  Chief Complaint  Patient presents with   Neuroma    Pt is here due to mass on the bottom of his right foot, he states that it has been there for a long time, does not hurt, PCP recommended him here for it.     53 y.o. male presents with the above complaint.  Patient presents with complaint of right plantar foot lipoma.  He states it does not cause him any pain just wanted get it evaluated and diagnosed.  He also has very flatfoot deformity he would like to discuss orthotics option as well.  Denies any other acute issues pain scale 0 out of 10.   Review of Systems: Negative except as noted in the HPI. Denies N/V/F/Ch.  Past Medical History:  Diagnosis Date   Allergy    Atrial fibrillation (HCC)    GERD (gastroesophageal reflux disease)    every once in awhile   Hypertension    Thyroid disease     Current Outpatient Medications:    busPIRone  (BUSPAR ) 15 MG tablet, Take 1 tablet (15 mg total) by mouth 2 (two) times daily., Disp: 28 tablet, Rfl: 0   levothyroxine  (SYNTHROID ) 100 MCG tablet, Take 1 tablet (100 mcg total) by mouth daily., Disp: 90 tablet, Rfl: 0   lithium  carbonate (ESKALITH ) 450 MG ER tablet, Take 1 tablet (450 mg total) by mouth every 12 (twelve) hours., Disp: 28 tablet, Rfl: 0   metoprolol  succinate (TOPROL -XL) 50 MG 24 hr tablet, Take 1 tablet (50 mg total) by mouth daily. Take with or immediately following a meal., Disp: 90 tablet, Rfl: 1   midodrine  (PROAMATINE ) 5 MG tablet, Take 1 tablet (5 mg total) by mouth 2 (two) times daily., Disp: 90 tablet, Rfl: 1   risperiDONE  (RISPERDAL ) 1 MG tablet, Take 1.5 tablets (1.5 mg total) by mouth at bedtime., Disp: 21 tablet, Rfl: 0   rivaroxaban  (XARELTO ) 20 MG TABS tablet, Take 1 tablet (20 mg total) by mouth every morning., Disp: 90 tablet, Rfl: 1   temazepam  (RESTORIL ) 15 MG capsule, Take 1 capsule (15 mg total) by mouth at bedtime as needed  for sleep., Disp: 30 capsule, Rfl: 1  Social History   Tobacco Use  Smoking Status Never  Smokeless Tobacco Never    Allergies  Allergen Reactions   Cat Dander    Objective:  There were no vitals filed for this visit. Body mass index is 30.62 kg/m. Constitutional Well developed. Well nourished.  Vascular Dorsalis pedis pulses palpable bilaterally. Posterior tibial pulses palpable bilaterally. Capillary refill normal to all digits.  No cyanosis or clubbing noted. Pedal hair growth normal.  Neurologic Normal speech. Oriented to person, place, and time. Epicritic sensation to light touch grossly present bilaterally.  Dermatologic Nails well groomed and normal in appearance. No open wounds. No skin lesions.  Orthopedic: Right soft tissue mass modular soft well-circumscribed.  Does not appear to be indurated.  Negative transilluminates.  Findings consistent with a lipoma  Gait examination shows pes planovalgus deformity with calcaneovalgus to many toe signs unable to recreate the arch with dorsiflexion of the hallux unable to perform single and double heel raise   Radiographs: None Assessment:   1. Soft tissue mass   2. Pes planovalgus    Plan:  Patient was evaluated and treated and all questions answered.  Right soft tissue mass/lipoma - All questions or concerns were discussed with the patient in  extensive detail given the presence of lipoma at this time it is not clinically bothering him we will for now continue to monitor it.  If it ever starts bothering him in the future we can remove it surgically.  I discussed with the patient he states understanding  Pes planovalgus -I explained to patient the etiology of pes planovalgus and relationship with Planter fasciitis and various treatment options were discussed.  Given patient foot structure in the setting of Planter fasciitis I believe patient will benefit from custom-made orthotics to help control the hindfoot motion  support the arch of the foot and take the stress away from plantar fascial.  Patient agrees with the plan like to proceed with orthotics -Patient was casted for orthotics   No follow-ups on file.

## 2024-06-05 ENCOUNTER — Telehealth: Payer: Self-pay | Admitting: Podiatry

## 2024-06-05 NOTE — Telephone Encounter (Signed)
 Orthotics in [place] bin

## 2024-06-05 NOTE — Telephone Encounter (Signed)
 LVM to schedule orthotic fitting/ pu

## 2024-06-15 ENCOUNTER — Other Ambulatory Visit

## 2024-06-15 NOTE — Progress Notes (Unsigned)
  Cardiology Office Note   Date:  06/16/2024  ID:  Curtis Hodges, DOB 03-Mar-1971, MRN [id number] PCP: Gareth Mliss FALCON, FNP  [facility] Providers Cardiologist:  Caron Poser, MD     History of Present Illness Curtis Hodges is a 53 y.o. male PMH persistent atrial fibrillation, HFrecEF, bipolar disorder, hypothyroidism, orthostatic hypotension, and prior PE who presents to establish care.  Patient reports he is overall feeling well.  He denies any dyspnea, palpitations, orthopnea, syncope, or any other high risk symptoms.  He has been in atrial fibrillation for quite some time.  He has discussed rate versus rhythm control is with his previous providers.  Given his mental health medications and other issues, they settled on a rate control strategy only.  We discussed options again today, and he would prefer to do the same.  Last LDL 70 04/2024.  Relevant CVD History -CTPA 09/2023 no aortic atherosclerosis and no CAC -TTE 03/2023 normal biventricular function and no valvular disease -Zio monitor 09/2022 100% burden of atrial fibrillation with a single pause of 3.0 seconds.  Mean heart rate 88. - TTE 12/2020 LVEF 45% without significant valvular disease   ROS: Pt denies any chest discomfort, jaw pain, arm pain, palpitations, syncope, presyncope, orthopnea, PND, or LE edema.  Studies Reviewed I have independently reviewed the patient's ECG, previous cardiac testing, previous blood work, and recent medical records.  Physical Exam VS:  BP 122/86 (BP Location: Right Arm, Patient Position: Sitting, Cuff Size: Normal)   Pulse 84   Ht 6' 5 (1.956 m)   Wt 264 lb (119.7 kg)   SpO2 97%   BMI 31.31 kg/m        Wt Readings from Last 3 Encounters:  06/16/24 264 lb (119.7 kg)  05/07/24 258 lb 3.2 oz (117.1 kg)  04/30/24 258 lb 3.2 oz (117.1 kg)    GEN: No acute distress. NECK: No JVD; No carotid bruits. CARDIAC: Irregular rate/rhythm, no murmurs, rubs, gallops. RESPIRATORY:  Clear to  auscultation. EXTREMITIES:  Warm and well-perfused. No edema.  ASSESSMENT AND PLAN Persistent atrial fibrillation, longstanding Heart failure with recovered ejection fraction Patient has had longstanding atrial fibrillation which has been rate controlled.  Some of the reasons for avoiding a rhythm control strategy have been interaction with psychiatric medications and patient preference.  We had a discussion today again regarding rate versus rhythm control including the benefits of rhythm control over rate control.  After shared decision making discussion, he would like to continue with a rate control only strategy.  His LV dysfunction has recovered on the last several echocardiograms after achieving rate control.  Heart failure GDMT medications have been limited by hypotension.  Plan: - Continue Xarelto  20 mg daily for CVA prophylaxis - Continue metoprolol  succinate 50 mg daily - Repeat echocardiogram; if EF is still preserved and he is otherwise feeling well, we can begin to space these out  Orthostatic hypotension This has improved significantly over the past year or 2 after starting midodrine .  Continue midodrine  5 mg twice daily.        Dispo: RTC 6 months or sooner as needed  Signed, Caron Poser, MD

## 2024-06-16 ENCOUNTER — Ambulatory Visit

## 2024-06-16 VITALS — BP 122/86 | HR 84 | Ht 77.0 in | Wt 264.0 lb

## 2024-06-16 DIAGNOSIS — I5020 Unspecified systolic (congestive) heart failure: Secondary | ICD-10-CM

## 2024-06-16 DIAGNOSIS — I5032 Chronic diastolic (congestive) heart failure: Secondary | ICD-10-CM | POA: Diagnosis not present

## 2024-06-16 DIAGNOSIS — I951 Orthostatic hypotension: Secondary | ICD-10-CM

## 2024-06-16 DIAGNOSIS — I4811 Longstanding persistent atrial fibrillation: Secondary | ICD-10-CM | POA: Diagnosis not present

## 2024-06-16 NOTE — Patient Instructions (Signed)
 Medication Instructions:   Your physician recommends that you continue on your current medications as directed. Please refer to the Current Medication list given to you today.  *If you need a refill on your cardiac medications before your next appointment, please call your pharmacy*  Lab Work:  No labs ordered today  If you have labs (blood work) drawn today and your tests are completely normal, you will receive your results only by: MyChart Message (if you have MyChart) OR A paper copy in the mail If you have any lab test that is abnormal or we need to change your treatment, we will call you to review the results.  Testing/Procedures:  Your physician has requested that you have an echocardiogram. Echocardiography is a painless test that uses sound waves to create images of your heart. It provides your doctor with information about the size and shape of your heart and how well your heart's chambers and valves are working.   You may receive an ultrasound enhancing agent through an IV if needed to better visualize your heart during the echo. This procedure takes approximately one hour.  There are no restrictions for this procedure.  This will take place at [address] [facility] [address], [place] [address]  Please note: We ask at that you not bring children with you during ultrasound (echo/ vascular) testing. Due to room size and safety concerns, children are not allowed in the ultrasound rooms during exams. Our front office staff cannot provide observation of children in our lobby area while testing is being conducted. An adult accompanying a patient to their appointment will only be allowed in the ultrasound room at the discretion of the ultrasound technician under special circumstances. We apologize for any inconvenience.   Follow-Up: At [facility], you and your health needs are our priority.  As part of our continuing mission to provide you with exceptional  heart care, our providers are all part of one team.  This team includes your primary Cardiologist (physician) and Advanced Practice Providers or APPs (Physician Assistants and Nurse Practitioners) who all work together to provide you with the care you need, when you need it.  Your next appointment:   6 month(s)  Provider:   Caron Poser, MD   We recommend signing up for the patient portal called MyChart.  Sign up information is provided on this After Visit Summary.  MyChart is used to connect with patients for Virtual Visits (Telemedicine).  Patients are able to view lab/test results, encounter notes, upcoming appointments, etc.  Non-urgent messages can be sent to your provider as well.   To learn more about what you can do with MyChart, go to [email or web address].

## 2024-06-18 ENCOUNTER — Ambulatory Visit (INDEPENDENT_AMBULATORY_CARE_PROVIDER_SITE_OTHER): Admitting: Podiatry

## 2024-06-18 DIAGNOSIS — Q666 Other congenital valgus deformities of feet: Secondary | ICD-10-CM

## 2024-06-18 NOTE — Progress Notes (Signed)
 Orthotics were dispensed they are functioning well no acute complaints.  Break-in period was discussed

## 2024-07-30 ENCOUNTER — Other Ambulatory Visit

## 2024-08-06 ENCOUNTER — Ambulatory Visit

## 2024-08-06 ENCOUNTER — Ambulatory Visit: Payer: Self-pay

## 2024-08-06 DIAGNOSIS — I5020 Unspecified systolic (congestive) heart failure: Secondary | ICD-10-CM

## 2024-08-06 DIAGNOSIS — I4811 Longstanding persistent atrial fibrillation: Secondary | ICD-10-CM

## 2024-08-06 LAB — ECHOCARDIOGRAM COMPLETE
AR max vel: 4.88 cm2
AV Area VTI: 5.15 cm2
AV Area mean vel: 4.65 cm2
AV Mean grad: 2 mmHg
AV Peak grad: 3.5 mmHg
Ao pk vel: 0.93 m/s
Area-P 1/2: 3.75 cm2
S' Lateral: 3.2 cm

## 2024-09-03 ENCOUNTER — Other Ambulatory Visit: Payer: Self-pay | Admitting: Nurse Practitioner

## 2024-09-03 DIAGNOSIS — E039 Hypothyroidism, unspecified: Secondary | ICD-10-CM

## 2024-09-07 NOTE — Telephone Encounter (Signed)
 Requested Prescriptions  Pending Prescriptions Disp Refills   levothyroxine  (SYNTHROID ) 100 MCG tablet [Pharmacy Med Name: Levothyroxine  Sodium 100 MCG Oral Tablet] 90 tablet 0    Sig: Take 1 tablet by mouth once daily     Endocrinology:  Hypothyroid Agents Failed - 09/07/2024  2:20 PM      Failed - TSH in normal range and within 360 days    TSH  Date Value Ref Range Status  04/30/2024 5.36 (H) 0.40 - 4.50 mIU/L Final         Passed - Valid encounter within last 12 months    Recent Outpatient Visits           4 months ago Annual physical exam   [facility] Gareth Mliss FAL[place], FNP   9 months ago Nausea and vomiting, unspecified vomiting type   [facility] Gareth Mliss FALCON, [place]

## 2024-10-23 ENCOUNTER — Other Ambulatory Visit: Payer: Self-pay | Admitting: Nurse Practitioner

## 2024-10-23 DIAGNOSIS — I429 Cardiomyopathy, unspecified: Secondary | ICD-10-CM

## 2024-10-23 DIAGNOSIS — I5030 Unspecified diastolic (congestive) heart failure: Secondary | ICD-10-CM
